# Patient Record
Sex: Male | Born: 1960 | Race: White | Hispanic: No | Marital: Married | Smoking: Never smoker
Health system: Southern US, Community
[De-identification: ages and names within clinical notes are randomized; demographics above are authoritative.]

## PROBLEM LIST (undated history)

## (undated) DIAGNOSIS — E871 Hypo-osmolality and hyponatremia: Secondary | ICD-10-CM

## (undated) DIAGNOSIS — E039 Hypothyroidism, unspecified: Secondary | ICD-10-CM

## (undated) DIAGNOSIS — C801 Malignant (primary) neoplasm, unspecified: Secondary | ICD-10-CM

## (undated) DIAGNOSIS — Z8719 Personal history of other diseases of the digestive system: Secondary | ICD-10-CM

## (undated) DIAGNOSIS — N183 Chronic kidney disease, stage 3 (moderate): Secondary | ICD-10-CM

## (undated) DIAGNOSIS — Z8739 Personal history of other diseases of the musculoskeletal system and connective tissue: Secondary | ICD-10-CM

## (undated) DIAGNOSIS — R253 Fasciculation: Secondary | ICD-10-CM

## (undated) DIAGNOSIS — K219 Gastro-esophageal reflux disease without esophagitis: Secondary | ICD-10-CM

## (undated) DIAGNOSIS — M1990 Unspecified osteoarthritis, unspecified site: Secondary | ICD-10-CM

## (undated) DIAGNOSIS — Z87442 Personal history of urinary calculi: Secondary | ICD-10-CM

## (undated) DIAGNOSIS — R079 Chest pain, unspecified: Secondary | ICD-10-CM

## (undated) DIAGNOSIS — K56609 Unspecified intestinal obstruction, unspecified as to partial versus complete obstruction: Secondary | ICD-10-CM

## (undated) DIAGNOSIS — H9190 Unspecified hearing loss, unspecified ear: Secondary | ICD-10-CM

## (undated) DIAGNOSIS — Z973 Presence of spectacles and contact lenses: Secondary | ICD-10-CM

## (undated) DIAGNOSIS — N401 Enlarged prostate with lower urinary tract symptoms: Secondary | ICD-10-CM

## (undated) DIAGNOSIS — I10 Essential (primary) hypertension: Principal | ICD-10-CM

## (undated) DIAGNOSIS — G4733 Obstructive sleep apnea (adult) (pediatric): Secondary | ICD-10-CM

## (undated) DIAGNOSIS — Z7901 Long term (current) use of anticoagulants: Secondary | ICD-10-CM

## (undated) DIAGNOSIS — M87059 Idiopathic aseptic necrosis of unspecified femur: Secondary | ICD-10-CM

## (undated) DIAGNOSIS — F419 Anxiety disorder, unspecified: Secondary | ICD-10-CM

## (undated) DIAGNOSIS — E782 Mixed hyperlipidemia: Secondary | ICD-10-CM

## (undated) DIAGNOSIS — Z9889 Other specified postprocedural states: Secondary | ICD-10-CM

## (undated) DIAGNOSIS — R7303 Prediabetes: Secondary | ICD-10-CM

## (undated) DIAGNOSIS — K9419 Other complications of enterostomy: Secondary | ICD-10-CM

## (undated) HISTORY — PX: CARDIAC CATHETERIZATION: SHX172

## (undated) HISTORY — DX: Hypothyroidism, unspecified: E03.9

## (undated) HISTORY — PX: TOTAL HIP ARTHROPLASTY: SHX124

## (undated) HISTORY — DX: Hypomagnesemia: E83.42

## (undated) HISTORY — PX: APPENDECTOMY: SHX54

## (undated) HISTORY — DX: Chronic kidney disease, stage 3 (moderate): N18.3

## (undated) HISTORY — PX: CHOLECYSTECTOMY: SHX55

## (undated) HISTORY — DX: Obstructive sleep apnea (adult) (pediatric): G47.33

## (undated) HISTORY — DX: Chest pain, unspecified: R07.9

## (undated) HISTORY — DX: Other specified postprocedural states: Z98.890

## (undated) HISTORY — DX: Prediabetes: R73.03

## (undated) HISTORY — DX: Other complications of enterostomy: K94.19

## (undated) HISTORY — DX: Fasciculation: R25.3

## (undated) HISTORY — DX: Idiopathic aseptic necrosis of unspecified femur: M87.059

## (undated) HISTORY — PX: PROSTATE BIOPSY: SHX241

## (undated) HISTORY — PX: PLACEMENT OF SETON: SHX6029

## (undated) HISTORY — PX: ILEOSTOMY: SHX1783

## (undated) HISTORY — PX: TONSILLECTOMY AND ADENOIDECTOMY: SUR1326

## (undated) HISTORY — DX: Anxiety disorder, unspecified: F41.9

## (undated) HISTORY — DX: Gastro-esophageal reflux disease without esophagitis: K21.9

## (undated) HISTORY — DX: Hypo-osmolality and hyponatremia: E87.1

## (undated) HISTORY — DX: Unspecified intestinal obstruction, unspecified as to partial versus complete obstruction: K56.609

## (undated) HISTORY — DX: Essential (primary) hypertension: I10

## (undated) HISTORY — DX: Mixed hyperlipidemia: E78.2

---

## 1998-12-10 HISTORY — PX: LAPAROSCOPIC APPENDECTOMY: SUR753

## 2004-10-17 ENCOUNTER — Ambulatory Visit: Payer: Self-pay | Admitting: Family Medicine

## 2004-12-10 HISTORY — PX: EXTRACORPOREAL SHOCK WAVE LITHOTRIPSY: SHX1557

## 2004-12-21 ENCOUNTER — Ambulatory Visit: Payer: Self-pay | Admitting: Family Medicine

## 2005-02-21 ENCOUNTER — Ambulatory Visit: Payer: Self-pay | Admitting: Family Medicine

## 2005-11-12 ENCOUNTER — Ambulatory Visit: Payer: Self-pay | Admitting: Family Medicine

## 2006-12-10 HISTORY — PX: CARDIAC CATHETERIZATION: SHX172

## 2006-12-10 HISTORY — PX: CHOLECYSTECTOMY, LAPAROSCOPIC: SHX56

## 2009-12-10 HISTORY — PX: TOTAL HIP ARTHROPLASTY: SHX124

## 2014-12-10 HISTORY — PX: COLONOSCOPY: SHX174

## 2016-01-19 DIAGNOSIS — I129 Hypertensive chronic kidney disease with stage 1 through stage 4 chronic kidney disease, or unspecified chronic kidney disease: Secondary | ICD-10-CM

## 2016-01-19 DIAGNOSIS — I10 Essential (primary) hypertension: Secondary | ICD-10-CM | POA: Insufficient documentation

## 2016-01-19 HISTORY — DX: Essential (primary) hypertension: I10

## 2016-01-19 HISTORY — DX: Hypertensive chronic kidney disease with stage 1 through stage 4 chronic kidney disease, or unspecified chronic kidney disease: I12.9

## 2016-01-30 ENCOUNTER — Ambulatory Visit: Payer: BLUE CROSS/BLUE SHIELD | Admitting: Allergy and Immunology

## 2016-03-11 DIAGNOSIS — R079 Chest pain, unspecified: Secondary | ICD-10-CM | POA: Insufficient documentation

## 2016-03-11 HISTORY — DX: Chest pain, unspecified: R07.9

## 2016-04-10 DIAGNOSIS — K603 Anal fistula, unspecified: Secondary | ICD-10-CM

## 2016-04-10 DIAGNOSIS — Z8719 Personal history of other diseases of the digestive system: Secondary | ICD-10-CM

## 2016-04-10 HISTORY — DX: Anal fistula: K60.3

## 2016-04-10 HISTORY — DX: Anal fistula, unspecified: K60.30

## 2016-04-10 HISTORY — DX: Personal history of other diseases of the digestive system: Z87.19

## 2016-06-26 DIAGNOSIS — F4320 Adjustment disorder, unspecified: Secondary | ICD-10-CM | POA: Insufficient documentation

## 2016-06-26 DIAGNOSIS — K219 Gastro-esophageal reflux disease without esophagitis: Secondary | ICD-10-CM | POA: Insufficient documentation

## 2016-06-26 DIAGNOSIS — E038 Other specified hypothyroidism: Secondary | ICD-10-CM

## 2016-06-26 DIAGNOSIS — H90A22 Sensorineural hearing loss, unilateral, left ear, with restricted hearing on the contralateral side: Secondary | ICD-10-CM

## 2016-06-26 DIAGNOSIS — H9042 Sensorineural hearing loss, unilateral, left ear, with unrestricted hearing on the contralateral side: Secondary | ICD-10-CM | POA: Insufficient documentation

## 2016-06-26 DIAGNOSIS — J309 Allergic rhinitis, unspecified: Secondary | ICD-10-CM

## 2016-06-26 DIAGNOSIS — E782 Mixed hyperlipidemia: Secondary | ICD-10-CM

## 2016-06-26 DIAGNOSIS — M87059 Idiopathic aseptic necrosis of unspecified femur: Secondary | ICD-10-CM

## 2016-06-26 DIAGNOSIS — E039 Hypothyroidism, unspecified: Secondary | ICD-10-CM | POA: Insufficient documentation

## 2016-06-26 DIAGNOSIS — G4733 Obstructive sleep apnea (adult) (pediatric): Secondary | ICD-10-CM

## 2016-06-26 DIAGNOSIS — R253 Fasciculation: Secondary | ICD-10-CM | POA: Insufficient documentation

## 2016-06-26 HISTORY — DX: Idiopathic aseptic necrosis of unspecified femur: M87.059

## 2016-06-26 HISTORY — DX: Adjustment disorder, unspecified: F43.20

## 2016-06-26 HISTORY — DX: Fasciculation: R25.3

## 2016-06-26 HISTORY — DX: Sensorineural hearing loss, unilateral, left ear, with unrestricted hearing on the contralateral side: H90.42

## 2016-06-26 HISTORY — DX: Other specified hypothyroidism: E03.8

## 2016-06-26 HISTORY — DX: Allergic rhinitis, unspecified: J30.9

## 2016-06-26 HISTORY — DX: Mixed hyperlipidemia: E78.2

## 2016-06-26 HISTORY — DX: Sensorineural hearing loss, unilateral, left ear, with restricted hearing on the contralateral side: H90.A22

## 2016-06-26 HISTORY — DX: Gastro-esophageal reflux disease without esophagitis: K21.9

## 2016-06-26 HISTORY — DX: Obstructive sleep apnea (adult) (pediatric): G47.33

## 2016-07-02 DIAGNOSIS — R7303 Prediabetes: Secondary | ICD-10-CM

## 2016-07-02 HISTORY — DX: Prediabetes: R73.03

## 2016-07-06 HISTORY — PX: ANAL FISSURE REPAIR: SHX2312

## 2016-12-10 DIAGNOSIS — F419 Anxiety disorder, unspecified: Secondary | ICD-10-CM

## 2016-12-10 HISTORY — DX: Anxiety disorder, unspecified: F41.9

## 2017-04-19 DIAGNOSIS — N183 Chronic kidney disease, stage 3 unspecified: Secondary | ICD-10-CM | POA: Insufficient documentation

## 2017-04-19 HISTORY — DX: Chronic kidney disease, stage 3 unspecified: N18.30

## 2017-06-07 HISTORY — PX: LAPAROSCOPIC LOOP COLOSTOMY: SHX6816

## 2017-06-24 DIAGNOSIS — K56609 Unspecified intestinal obstruction, unspecified as to partial versus complete obstruction: Secondary | ICD-10-CM

## 2017-06-24 HISTORY — DX: Unspecified intestinal obstruction, unspecified as to partial versus complete obstruction: K56.609

## 2017-06-26 DIAGNOSIS — E871 Hypo-osmolality and hyponatremia: Secondary | ICD-10-CM | POA: Insufficient documentation

## 2017-06-26 HISTORY — DX: Hypo-osmolality and hyponatremia: E87.1

## 2017-06-28 HISTORY — PX: EXPLORATORY LAPAROTOMY W/ BOWEL RESECTION: SHX1544

## 2017-07-03 DIAGNOSIS — Z9889 Other specified postprocedural states: Secondary | ICD-10-CM

## 2017-07-03 HISTORY — DX: Other specified postprocedural states: Z98.890

## 2017-07-10 DIAGNOSIS — N289 Disorder of kidney and ureter, unspecified: Secondary | ICD-10-CM | POA: Diagnosis not present

## 2017-07-10 DIAGNOSIS — E860 Dehydration: Secondary | ICD-10-CM | POA: Diagnosis not present

## 2017-07-10 DIAGNOSIS — E871 Hypo-osmolality and hyponatremia: Secondary | ICD-10-CM | POA: Diagnosis not present

## 2017-07-10 DIAGNOSIS — E872 Acidosis: Secondary | ICD-10-CM | POA: Diagnosis not present

## 2017-07-10 DIAGNOSIS — I10 Essential (primary) hypertension: Secondary | ICD-10-CM

## 2017-07-25 DIAGNOSIS — F419 Anxiety disorder, unspecified: Secondary | ICD-10-CM | POA: Insufficient documentation

## 2017-07-25 HISTORY — DX: Anxiety disorder, unspecified: F41.9

## 2017-08-04 HISTORY — DX: Hypomagnesemia: E83.42

## 2017-11-21 DIAGNOSIS — K9419 Other complications of enterostomy: Secondary | ICD-10-CM | POA: Insufficient documentation

## 2017-11-21 HISTORY — DX: Other complications of enterostomy: K94.19

## 2018-03-07 HISTORY — PX: RECTAL EXAM UNDER ANESTHESIA: SHX6399

## 2018-06-02 HISTORY — PX: COLOSTOMY TAKEDOWN: SHX5783

## 2018-09-24 NOTE — Progress Notes (Signed)
Cardiology Office Note:    Date:  09/25/2018   ID:  Lee Stevens, DOB [birth year], MRN [id number]  PCP:  Olive Bass, MD  Cardiologist:  Norman Herrlich, MD   Referring MD: No ref. provider found  ASSESSMENT:    1. Benign hypertension   2. History of closure of ileostomy   3. Chronic kidney disease, stage 3 (HCC)   4. Atypical angina (HCC)    PLAN:    In order of problems listed above:  1. Stable blood pressure target continue current treatment 2. Noted on chart review complicated from a cardiovascular perspective 3. Stable he can tolerate the dye load for CT 4. Known mild coronary atherosclerosis having atypical angina cardiac CTA ordered 5. Dyslipidemia stable continue his high intensity statin to return to  Next appointment 6 weeks   Medication Adjustments/Labs and Tests Ordered: Current medicines are reviewed at length with the patient today.  Concerns regarding medicines are outlined above.  Orders Placed This Encounter  Procedures  . CT CORONARY MORPH W/CTA COR W/SCORE W/CA W/CM &/OR WO/CM  . CT CORONARY FRACTIONAL FLOW RESERVE DATA PREP  . CT CORONARY FRACTIONAL FLOW RESERVE FLUID ANALYSIS  . EKG 12-Lead   No orders of the defined types were placed in this encounter.    Chief Complaint  Patient presents with  . Hypertension  . Hyperlipidemia  . Chest Pain    History of Present Illness:    Lee Stevens is a 57 y.o. male with luminal irregularity on left heart cath in 2008 hypertension, CKD,hyperlipidemia and prediabeteswho is being seen today for the evaluation of chest pain at the patients request. He was last seen by me 03/14/17  Following reversal of his ileostomy he had resolution chest pain and it has recurred.  At times is typical exertional angina relieved with rest but usually it is a diffuse aching throughout the right and left chest lasts for hours at a time and in fact is lasting for days.  Is not pleuritic no nausea or vomiting no diaphoresis  no radiation to the back or neck.  He has known mild coronary atherosclerosis had a normal stress echo in 2017 for further evaluation cardiac CTA.  He also has a feeling of forceful heartbeat but not irregular or fast he prefers to purchase an over-the-counter iPhone adapter to record his heart rhythm.  He will follow-up with me in the office in 6 months his hypertension stable on target dyslipidemia stable and continue his current medications. Past Medical History:  Diagnosis Date  . Anxiety 07/25/2017   2018  . 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 Benign hypertension 01/19/2016  . Chest pain in adult 03/11/2016   Seen at [facility] ED 12/08/15 by DR Earl Gala, EKG CXR and Troponin normal, elevated CRP and treated as pericarditis    Stress echo negative for ischemia 2013 at [address]  . Chronic kidney disease, stage 3 (HCC) 04/19/2017  . GERD (gastroesophageal reflux disease) 06/26/2016  . History of closure of ileostomy 07/03/2017  . Hyperlipidemia, mixed 06/26/2016  . Hypomagnesemia 08/04/2017   2018: 1.7  . Hyponatremia 06/26/2017  . 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 Obstructive sleep apnea 06/26/2016  . Prediabetes 07/02/2016   2018: 121/5.6  . SBO (small bowel obstruction) (HCC) 06/24/2017  . Subclinical hypothyroidism 06/26/2016    Past Surgical History:  Procedure Laterality Date  . APPENDECTOMY    . CARDIAC CATHETERIZATION    .  CHOLECYSTECTOMY    . ILEOSTOMY    . TONSILLECTOMY AND ADENOIDECTOMY    . TOTAL HIP ARTHROPLASTY      Current Medications: Current Meds  Medication Sig  . olmesartan (BENICAR) 20 MG tablet TAKE 1 TABLET EACH AM FOR HIGH BLOOD PRESSURE  . omeprazole (PRILOSEC) 40 MG capsule TAKE 1 CAPSULE DAILY FOR REFLUX  . rosuvastatin (CRESTOR) 20 MG tablet Take 20 mg by mouth daily.     Allergies:   Cephalexin   Social History   Socioeconomic History  . Marital status: Married    Spouse name: Not on file  .  Number of children: Not on file  . Years of education: Not on file  . Highest education level: Not on file  Occupational History  . Not on file  Social Needs  . Financial resource strain: Not on file  . Food insecurity:    Worry: Not on file    Inability: Not on file  . Transportation needs:    Medical: Not on file    Non-medical: Not on file  Tobacco Use  . Smoking status: Never Smoker  . Smokeless tobacco: Former Neurosurgeon    Types: Chew  Substance and Sexual Activity  . Alcohol use: Not Currently  . Drug use: Never  . Sexual activity: Not on file  Lifestyle  . Physical activity:    Days per week: Not on file    Minutes per session: Not on file  . Stress: Not on file  Relationships  . Social connections:    Talks on phone: Not on file    Gets together: Not on file    Attends religious service: Not on file    Active member of club or organization: Not on file    Attends meetings of clubs or organizations: Not on file    Relationship status: Not on file  Other Topics Concern  . Not on file  Social History Narrative  . Not on file     Family History: The patient's family history includes Heart attack in his father; Hyperlipidemia in his father; Hypertension in his mother.  ROS:   ROS Please see the history of present illness.     All other systems reviewed and are negative.  EKGs/Labs/Other Studies Reviewed:    The following studies were reviewed today:   EKG:  EKG  SRTH and normal ordered today.    Recent Labs: Cr 1,55 GFR 50 cc/min K 4.5 normal AST ALT No results found for requested labs within last 8760 hours.  Recent Lipid Panel   Chol 120 LDL 68 HDL 36     04/14/18 No results found for: CHOL, TRIG, HDL, CHOLHDL, VLDL, LDLCALC, LDLDIRECT  Physical Exam:    VS:  BP 120/84 (BP Location: Right Arm, Patient Position: Sitting, Cuff Size: Large)   Pulse 67   Ht 6\' 3"  (1.905 m)   Wt 222 lb 12.8 oz (101.1 kg)   SpO2 97%   BMI 27.85 kg/m     Wt Readings from  Last 3 Encounters:  09/25/18 222 lb 12.8 oz (101.1 kg)     GEN:  Well nourished, well developed in no acute distress HEENT: Normal NECK: No JVD; No carotid bruits LYMPHATICS: No lymphadenopathy CARDIAC: RRR, no murmurs, rubs, gallops RESPIRATORY:  Clear to auscultation without rales, wheezing or rhonchi  ABDOMEN: Soft, non-tender, non-distended MUSCULOSKELETAL:  No edema; No deformity  SKIN: Warm and dry NEUROLOGIC:  Alert and oriented x 3 PSYCHIATRIC:  Normal affect  Signed, Norman Herrlich, MD  09/25/2018 8:46 AM     [facility]

## 2018-09-25 ENCOUNTER — Encounter: Payer: Self-pay | Admitting: *Deleted

## 2018-09-25 ENCOUNTER — Ambulatory Visit (INDEPENDENT_AMBULATORY_CARE_PROVIDER_SITE_OTHER): Payer: BLUE CROSS/BLUE SHIELD | Admitting: Cardiology

## 2018-09-25 VITALS — BP 120/84 | HR 67 | Ht 75.0 in | Wt 222.8 lb

## 2018-09-25 DIAGNOSIS — I10 Essential (primary) hypertension: Secondary | ICD-10-CM

## 2018-09-25 DIAGNOSIS — I208 Other forms of angina pectoris: Secondary | ICD-10-CM

## 2018-09-25 DIAGNOSIS — N183 Chronic kidney disease, stage 3 unspecified: Secondary | ICD-10-CM

## 2018-09-25 DIAGNOSIS — Z9889 Other specified postprocedural states: Secondary | ICD-10-CM | POA: Diagnosis not present

## 2018-09-25 DIAGNOSIS — R0789 Other chest pain: Secondary | ICD-10-CM

## 2018-09-25 DIAGNOSIS — I2089 Other forms of angina pectoris: Secondary | ICD-10-CM

## 2018-09-25 HISTORY — DX: Other chest pain: R07.89

## 2018-09-25 HISTORY — DX: Other forms of angina pectoris: I20.89

## 2018-09-25 MED ORDER — METOPROLOL TARTRATE 50 MG PO TABS: 50.0000 mg | ORAL_TABLET | Freq: Once | ORAL | 0 refills | Status: DC

## 2018-09-25 NOTE — Patient Instructions (Addendum)
Medication Instructions:  Your physician recommends that you continue on your current medications as directed. Please refer to the Current Medication list given to you today.  If you need a refill on your cardiac medications before your next appointment, please call your pharmacy.   Lab work: You will have BMP 1 week prior to Cardiac CTA   If you have labs (blood work) drawn today and your tests are completely normal, you will receive your results only by: Marland Kitchen MyChart Message (if you have MyChart) OR . A paper copy in the mail If you have any lab test that is abnormal or we need to change your treatment, we will call you to review the results.  Testing/Procedures: You had an EKG today.  Your physician has requested that you have cardiac CT. Cardiac computed tomography (CT) is a painless test that uses an x-ray machine to take clear, detailed pictures of your heart. For further information please visit [email or web address] Please follow instruction sheet as given.   Please arrive at the [facility] main entrance of [facility] at xx:xx AM (30-45 minutes prior to test start time)  [facility] [address] [place], [place] [address] [phone]  Proceed to the [facility] Radiology Department (First Floor).  Please follow these instructions carefully (unless otherwise directed):  Hold all erectile dysfunction medications at least 48 hours prior to test.  On the Night Before the Test: . Be sure to Drink plenty of water. . Do not consume any caffeinated/decaffeinated beverages or chocolate 12 hours prior to your test. . Do not take any antihistamines 12 hours prior to your test.  On the Day of the Test: . Drink plenty of water. Do not drink any water within one hour of the test. . Do not eat any food 4 hours prior to the test. . You may take your regular medications prior to the test.  . Take metoprolol (Lopressor) two hours prior to test.    *For  Clinical Staff only. Please instruct patient the following:*        -Drink plenty of water       -Take metoprolol (Lopressor) 2 hours prior to test (if applicable).                  -If HR is less than 55 BPM- No Beta Blocker                -IF HR is greater than 55 BPM and patient is less than or equal to 45 yrs old Lopressor 100mg  x1.                -If HR is greater than 55 BPM and patient is greater than 83 yrs old Lopressor 50 mg x1.     Do not give Lopressor to patients with an allergy to lopressor or anyone with asthma or active COPD symptoms (currently taking steroids).       After the Test: . Drink plenty of water. . After receiving IV contrast, you may experience a mild flushed feeling. This is normal. . On occasion, you may experience a mild rash up to 24 hours after the test. This is not dangerous. If this occurs, you can take Benadryl 25 mg and increase your fluid intake. . If you experience trouble breathing, this can be serious. If it is severe call 911 IMMEDIATELY. If it is mild, please call our office.    Follow-Up: At [facility], you and your health needs  are our priority.  As part of our continuing mission to provide you with exceptional heart care, we have created designated Provider Care Teams.  These Care Teams include your primary Cardiologist (physician) and Advanced Practice Providers (APPs -  Physician Assistants and Nurse Practitioners) who all work together to provide you with the care you need, when you need it. . You will need a follow up appointment in 6 weeks.    Any Other Special Instructions Will Be Listed Below (If Applicable).          Introducing KardiaBand, the new wearable EKG by [employer]. Venetia Maxon replaces your original Apple Watch band. The first of its kind, FDA-cleared KardiaBand provides accurate and instant analysis for detecting atrial fibrillation (AF) and normal sinus rhythm in an EKG. Simply place your thumb on the integrated  KardiaBand sensor to take a medical-grade EKG in just 30 seconds. Results appear instantly on your Apple Watch. Venetia Maxon is available today for just $199. KardiaBand features are designed exclusively for use with [employer] - $99 year. [employer] for [employer] includes [employer]'s revolutionary SmartRhythm monitoring feature. SmartRhythm monitoring uses an intelligent neural network that runs directly on the [employer], constantly acquiring data from the watch's heart rate sensor and its accelerometer. SmartRhythm compares your heart rate to what it expects from your minute-by-minute level of activity. When the network sees a pattern of heart rate and activity that it does not expect, it notifies you to take an EKG. With [employer], peace of mind is just an EKG away.     Cardiac CT Angiogram A cardiac CT angiogram is a procedure to look at the heart and the area around the heart. It may be done to help find the cause of chest pains or other symptoms of heart disease. During this procedure, a large X-ray machine, called a CT scanner, takes detailed pictures of the heart and the surrounding area after a dye (contrast material) has been injected into blood vessels in the area. The procedure is also sometimes called a coronary CT angiogram, coronary artery scanning, or CTA. A cardiac CT angiogram allows the health care provider to see how well blood is flowing to and from the heart. The health care provider will be able to see if there are any problems, such as:  Blockage or narrowing of the coronary arteries in the heart.  Fluid around the heart.  Signs of weakness or disease in the muscles, valves, and tissues of the heart.  Tell a health care provider about:  Any allergies you have. This is especially important if you have had a previous allergic reaction to contrast dye.  All medicines you are taking, including vitamins, herbs, eye drops, creams, and over-the-counter  medicines.  Any blood disorders you have.  Any surgeries you have had.  Any medical conditions you have.  Whether you are pregnant or may be pregnant.  Any anxiety disorders, chronic pain, or other conditions you have that may increase your stress or prevent you from lying still. What are the risks? Generally, this is a safe procedure. However, problems may occur, including:  Bleeding.  Infection.  Allergic reactions to medicines or dyes.  Damage to other structures or organs.  Kidney damage from the dye or contrast that is used.  Increased risk of cancer from radiation exposure. This risk is low. Talk with your health care provider about: ? The risks and benefits of testing. ? How you can receive the lowest dose of radiation.  What happens before the procedure?  Wear comfortable clothing and remove any jewelry, glasses, dentures, and hearing aids.  Follow instructions from your health care provider about eating and drinking. This may include: ? For 12 hours before the test - avoid caffeine. This includes tea, coffee, soda, energy drinks, and diet pills. Drink plenty of water or other fluids that do not have caffeine in them. Being well-hydrated can prevent complications. ? For 4-6 hours before the test - stop eating and drinking. The contrast dye can cause nausea, but this is less likely if your stomach is empty.  Ask your health care provider about changing or stopping your regular medicines. This is especially important if you are taking diabetes medicines, blood thinners, or medicines to treat erectile dysfunction. What happens during the procedure?  Hair on your chest may need to be removed so that small sticky patches called electrodes can be placed on your chest. These will transmit information that helps to monitor your heart during the test.  An IV tube will be inserted into one of your veins.  You might be given a medicine to control your heart rate during the  test. This will help to ensure that good images are obtained.  You will be asked to lie on an exam table. This table will slide in and out of the CT machine during the procedure.  Contrast dye will be injected into the IV tube. You might feel warm, or you may get a metallic taste in your mouth.  You will be given a medicine (nitroglycerin) to relax (dilate) the arteries in your heart.  The table that you are lying on will move into the CT machine tunnel for the scan.  The person running the machine will give you instructions while the scans are being done. You may be asked to: ? Keep your arms above your head. ? Hold your breath. ? Stay very still, even if the table is moving.  When the scanning is complete, you will be moved out of the machine.  The IV tube will be removed. The procedure may vary among health care providers and hospitals. What happens after the procedure?  You might feel warm, or you may get a metallic taste in your mouth from the contrast dye.  You may have a headache from the nitroglycerin.  After the procedure, drink water or other fluids to wash (flush) the contrast material out of your body.  Contact a health care provider if you have any symptoms of allergy to the contrast. These symptoms include: ? Shortness of breath. ? Rash or hives. ? A racing heartbeat.  Most people can return to their normal activities right after the procedure. Ask your health care provider what activities are safe for you.  It is up to you to get the results of your procedure. Ask your health care provider, or the department that is doing the procedure, when your results will be ready. Summary  A cardiac CT angiogram is a procedure to look at the heart and the area around the heart. It may be done to help find the cause of chest pains or other symptoms of heart disease.  During this procedure, a large X-ray machine, called a CT scanner, takes detailed pictures of the heart and  the surrounding area after a dye (contrast material) has been injected into blood vessels in the area.  Ask your health care provider about changing or stopping your regular medicines before the procedure. This is especially important  if you are taking diabetes medicines, blood thinners, or medicines to treat erectile dysfunction.  After the procedure, drink water or other fluids to wash (flush) the contrast material out of your body. This information is not intended to replace advice given to you by your health care provider. Make sure you discuss any questions you have with your health care provider. Document Released: 11/08/2008 Document Revised: 10/15/2016 Document Reviewed: 10/15/2016 Elsevier Interactive Patient Education  2017 [employer].  .

## 2018-10-16 ENCOUNTER — Telehealth: Payer: Self-pay | Admitting: Cardiology

## 2018-10-16 NOTE — Telephone Encounter (Signed)
Patient called stating his CT still has not been scheduled.

## 2018-10-17 NOTE — Telephone Encounter (Signed)
Left message for patient on cell phone per DPR advising patient that we are waiting for insurance approval before cardiac CTA can be scheduled. Reminded him that unfortunately this is a bit of a process and the scheduler will call him when approval is received. Advised patient to contact our office with further questions or concerns.

## 2018-10-29 ENCOUNTER — Telehealth: Payer: Self-pay | Admitting: Cardiology

## 2018-10-29 NOTE — Telephone Encounter (Signed)
Patient advised that it can take up to 4 - 6 weeks for the insurance to approve cardiac CTA. Once his insurance has approved he will be scheduled.  Patient verbalized understanding.

## 2018-10-30 ENCOUNTER — Telehealth: Payer: Self-pay

## 2018-10-30 NOTE — Telephone Encounter (Signed)
Left message advising patient that he will need to have BMP prior to cardiac CTA on 11-14-18. Advised he should report to office for lab work on 11-14-18.

## 2018-10-31 NOTE — Telephone Encounter (Signed)
Patient called to inform us that he will be out of town 11/07/2018-11/13/18 so he wanted to know if he could have his lab work done before he leaves on Wednesday, 11/05/18. Advised patient that this is acceptable. No further questions.

## 2018-11-06 LAB — BASIC METABOLIC PANEL
BUN / CREAT RATIO: 10 (ref 9–20)
BUN: 16 mg/dL (ref 6–24)
CO2: 24 mmol/L (ref 20–29)
CREATININE: 1.61 mg/dL — AB (ref 0.76–1.27)
Calcium: 10.1 mg/dL (ref 8.7–10.2)
Chloride: 99 mmol/L (ref 96–106)
GFR calc Af Amer: 55 mL/min/{1.73_m2} — ABNORMAL LOW (ref >59)
GFR, EST NON AFRICAN AMERICAN: 47 mL/min/{1.73_m2} — AB (ref >59)
Glucose: 95 mg/dL (ref 65–99)
Potassium: 4.6 mmol/L (ref 3.5–5.2)
Sodium: 139 mmol/L (ref 134–144)

## 2018-11-10 ENCOUNTER — Telehealth: Payer: Self-pay | Admitting: *Deleted

## 2018-11-10 ENCOUNTER — Other Ambulatory Visit: Payer: Self-pay | Admitting: *Deleted

## 2018-11-10 MED ORDER — SODIUM CHLORIDE 0.9 % IV SOLN: 450.0000 mL | Freq: Once | INTRAVENOUS | 0 refills | Status: AC

## 2018-11-10 NOTE — Telephone Encounter (Signed)
-----   Message from Baldo DaubBrian J Munley, MD sent at 11/06/2018  7:48 AM EST ----- Can we IVF load prior to cardiac CTA?

## 2018-11-10 NOTE — Telephone Encounter (Signed)
Left message to return call to discuss lab results

## 2018-11-11 MED ORDER — METOPROLOL TARTRATE 50 MG PO TABS: 50.0000 mg | ORAL_TABLET | Freq: Once | ORAL | 0 refills | Status: DC

## 2018-11-11 NOTE — Telephone Encounter (Signed)
Patient informed of lab results and advised that he will need to arrive early at the radiology department of [facility] on Friday, 11/14/18 for pre-procedure IV fluids due to kidney function. Patient verbalized understanding to be there at 2:00 pm. Reviewed cardiac CTA instructions with patient and answered further questions. New prescription for one time dose of metoprolol tartrate sent to [facility] in [place] as [facility] did not have medication available. No further questions.

## 2018-11-11 NOTE — Addendum Note (Signed)
Addended by: Crist FatLOCKHART, Metta Koranda P on: 11/11/2018 10:15 AM   Modules accepted: Orders

## 2018-11-14 ENCOUNTER — Ambulatory Visit (HOSPITAL_COMMUNITY)
Admission: RE | Admit: 2018-11-14 | Discharge: 2018-11-14 | Disposition: A | Discharge status: Home / Self Care | Payer: BLUE CROSS/BLUE SHIELD | Source: Ambulatory Visit | Attending: Cardiology | Admitting: Cardiology

## 2018-11-14 DIAGNOSIS — I208 Other forms of angina pectoris: Secondary | ICD-10-CM

## 2018-11-14 DIAGNOSIS — Z006 Encounter for examination for normal comparison and control in clinical research program: Secondary | ICD-10-CM

## 2018-11-14 MED ORDER — SODIUM CHLORIDE 0.9 % IV SOLN
Freq: Once | INTRAVENOUS | Status: AC
  Administered 2018-11-14: 15:00:00 via INTRAVENOUS

## 2018-11-14 MED ORDER — NITROGLYCERIN 0.4 MG SL SUBL
SUBLINGUAL_TABLET | SUBLINGUAL | Status: AC
  Filled 2018-11-14: qty 2

## 2018-11-14 MED ORDER — IOPAMIDOL (ISOVUE-370) INJECTION 76%
100.0000 mL | Freq: Once | INTRAVENOUS | Status: AC | PRN
  Administered 2018-11-14: 100 mL via INTRAVENOUS

## 2018-11-14 NOTE — Research (Signed)
Lee Stevens met inclusion and exclusion criteria.  The informed consent form, study requirements and expectations were reviewed with the subject and questions and concerns were addressed prior to the signing of the consent form.  The subject verbalized understanding of the trial requirements.  The subject agreed to participate in the CADFEM trial and signed the informed consent.  The informed consent was obtained prior to performance of any protocol-specific procedures for the subject.  A copy of the signed informed consent was given to the subject and a copy was placed in the subject's medical record.   [name]. [employer]

## 2018-11-26 DIAGNOSIS — H9313 Tinnitus, bilateral: Secondary | ICD-10-CM

## 2018-11-26 DIAGNOSIS — H6981 Other specified disorders of Eustachian tube, right ear: Secondary | ICD-10-CM | POA: Insufficient documentation

## 2018-11-26 DIAGNOSIS — H6991 Unspecified Eustachian tube disorder, right ear: Secondary | ICD-10-CM

## 2018-11-26 HISTORY — DX: Tinnitus, bilateral: H93.13

## 2018-11-26 HISTORY — DX: Unspecified eustachian tube disorder, right ear: H69.91

## 2018-12-01 DIAGNOSIS — I2510 Atherosclerotic heart disease of native coronary artery without angina pectoris: Secondary | ICD-10-CM | POA: Insufficient documentation

## 2018-12-01 HISTORY — DX: Atherosclerotic heart disease of native coronary artery without angina pectoris: I25.10

## 2018-12-01 NOTE — Progress Notes (Signed)
Cardiology Office Note:    Date:  12/04/2018   ID:  Lee IbaDanny R Axley, DOB [birth year], MRN [id number]  PCP:  Olive Bassough, Robert L, MD  Cardiologist:  Norman HerrlichBrian Delcie Ruppert, MD    Referring MD: Olive Bassough, Robert L, MD    ASSESSMENT:    1. CAD in native artery   2. Hypertensive kidney disease with stage 2 chronic kidney disease   3. Hyperlipidemia, mixed   4. PFO (patent foramen ovale)    PLAN:    In order of problems listed above:  1. Stable, his cardiac CTA shows moderate diffuse non-flow-limiting CAD.  He is on appropriate medical therapy of aspirin antihypertensive and high intensity statin.  He has vague symptoms were rarely he gets chest tightness he had [place] and was associated with a sensation of apprehension as he describes a near out of body experience and he has a dread of a heart problem while he is traveling which he does extensively with work.  He has a prescription for nitroglycerin I told him to take as needed for chest tightness and then put him on a low-dose of a beta-blocker to try to attenuate these episodes.  Were both comfortable he does not need coronary revascularization.  I did encourage him to continue his good diabetic control A1c 5.5 lipid lowering with an LDL with a recent increase in his high intensity statin and to find a fish oil product high in EPA as unfortunately he will not qualify for prescription vascepa.  I also challenged him to achieve 150-minute minutes of activity exercise per week.  I will plan to see him back in the office in 1 year. 2. Well-controlled systolic at target continue ARB with his diabetes and CKD 3. Continue his high intensity statin dose increased and if able to find a high EPA content fish oil which seems to be synergistic with statins 4. PFO noted on CT scan instructed not to scuba.  At this time no indication to think of closure and will continue low-dose aspirin. 5. CTA showed pulmonary nodule however  follow-up CT performed without contrast 6 months   Next appointment: 1 year   Medication Adjustments/Labs and Tests Ordered: Current medicines are reviewed at length with the patient today.  Concerns regarding medicines are outlined above.  No orders of the defined types were placed in this encounter.  No orders of the defined types were placed in this encounter.   Chief Complaint  Patient presents with  . Follow-up    CT Scan    History of Present Illness:    Lee Stevens is a 57 y.o. male with a hx of coronary atherosclerosis with luminal irregularity on left heart cath in 2008 hypertension, CKD,hyperlipidemia and prediabetes last seen by me 09/25/2018 for chest pain.  Cardiac CTA showed moderate CAD with stenosis in the mid right coronary artery proximal PDA proximal posterior lateral branch proximal and mid LAD ramus and subsequent fractional flow reserve did not show flow-limiting stenosis.  He also was noted to have patent foramen ovale.  Most severe stenosis was in the range of 50 to 69% most recent lipid profile 04/14/2018 at a cholesterol 120 LDL 68 HDL 36 he has CKD stage II with a GFR 50 cc creatinine 1.55 Compliance with diet, lifestyle and medications: Yes  He is reassured by the findings of his cardiac CTA.  [place] [facility] and vague chest tightness and then symptoms of  anxiety which tends to happen when he travels.  To intensify his antianginal treatment and start a low-dose beta-blocker he will use nitroglycerin if needed and I will plan to see back in the office in 1 year.  He also has a patent foramen ovale and I told him he cannot scuba.  I had an opportunity to review the CTA with him took the report highlighted the findings protein explanation and gave him a copy for his daughter who is a Engineer, civil (consulting)nurse. Past Medical History:  Diagnosis Date  . Anxiety 07/25/2017   2018  . 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 Benign fasciculation-cramp  syndrome 06/26/2016  . Benign hypertension 01/19/2016  . Chest pain in adult 03/11/2016   [facility] by DR Earl Galasborne, EKG CXR and Troponin normal, elevated CRP and treated as pericarditis    Stress echo negative for ischemia 2013 at 13 [address]  . Chronic kidney disease, stage 3 (HCC) 04/19/2017  . GERD (gastroesophageal reflux disease) 06/26/2016  . History of closure of ileostomy 07/03/2017  . Hyperlipidemia, mixed 06/26/2016  . Hypomagnesemia 08/04/2017   2018: 1.7  . Hyponatremia 06/26/2017  . 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 Obstructive sleep apnea 06/26/2016  . Prediabetes 07/02/2016   2018: 121/5.6  . SBO (small bowel obstruction) (HCC) 06/24/2017  . Subclinical hypothyroidism 06/26/2016    Past Surgical History:  Procedure Laterality Date  . APPENDECTOMY    . CARDIAC CATHETERIZATION    . CHOLECYSTECTOMY    . ILEOSTOMY    . TONSILLECTOMY AND ADENOIDECTOMY    . TOTAL HIP ARTHROPLASTY      Current Medications: Current Meds  Medication Sig  . aspirin EC 81 MG tablet Take 81 mg by mouth daily.  . Multiple Vitamin (MULTIVITAMIN) tablet Take 1 tablet by mouth daily.  Marland Kitchen. olmesartan (BENICAR) 20 MG tablet TAKE 1 TABLET EACH AM FOR HIGH BLOOD PRESSURE  . omeprazole (PRILOSEC) 40 MG capsule TAKE 1 CAPSULE DAILY FOR REFLUX  . rosuvastatin (CRESTOR) 40 MG tablet Take 40 mg by mouth daily.      Allergies:   Cephalexin   Social History   Socioeconomic History  . Marital status: Married    Spouse name: Not on file  . Number of children: Not on file  . Years of education: Not on file  . Highest education level: Not on file  Occupational History  . Not on file  Social Needs  . Financial resource strain: Not on file  . Food insecurity:    Worry: Not on file    Inability: Not on file  . Transportation needs:    Medical: Not on file    Non-medical: Not on file  Tobacco Use  . Smoking status: Never Smoker  . Smokeless tobacco: Former NeurosurgeonUser    Types: Chew  Substance and  Sexual Activity  . Alcohol use: Not Currently  . Drug use: Never  . Sexual activity: Not on file  Lifestyle  . Physical activity:    Days per week: Not on file    Minutes per session: Not on file  . Stress: Not on file  Relationships  . Social connections:    Talks on phone: Not on file    Gets together: Not on file    Attends religious service: Not on file    Active member of club or organization: Not on file    Attends meetings of clubs or organizations: Not on file    Relationship status: Not  on file  Other Topics Concern  . Not on file  Social History Narrative  . Not on file     Family History: The patient's family history includes Heart attack in his father; Hyperlipidemia in his father; Hypertension in his mother. ROS:   Please see the history of present illness.    All other systems reviewed and are negative.  EKGs/Labs/Other Studies Reviewed:    The following studies were reviewed today  Recent Labs: 11/05/2018: BUN 16; Creatinine, Ser 1.61; Potassium 4.6; Sodium 139  Recent Lipid Panel No results found for: CHOL, TRIG, HDL, CHOLHDL, VLDL, LDLCALC, LDLDIRECT  Physical Exam:    VS:  BP 110/68   Pulse 70   Ht 6\' 3"  (1.905 m)   Wt 223 lb 1.9 oz (101.2 kg)   SpO2 97%   BMI 27.89 kg/m     Wt Readings from Last 3 Encounters:  12/04/18 223 lb 1.9 oz (101.2 kg)  09/25/18 222 lb 12.8 oz (101.1 kg)     GEN:  Well nourished, well developed in no acute distress HEENT: Normal NECK: No JVD; No carotid bruits LYMPHATICS: No lymphadenopathy CARDIAC: RRR, no murmurs, rubs, gallops RESPIRATORY:  Clear to auscultation without rales, wheezing or rhonchi  ABDOMEN: Soft, non-tender, non-distended MUSCULOSKELETAL:  No edema; No deformity  SKIN: Warm and dry NEUROLOGIC:  Alert and oriented x 3 PSYCHIATRIC:  Normal affect    Signed, Norman Herrlich, MD  12/04/2018 9:34 AM    [facility]

## 2018-12-04 ENCOUNTER — Other Ambulatory Visit: Payer: Self-pay

## 2018-12-04 ENCOUNTER — Ambulatory Visit (INDEPENDENT_AMBULATORY_CARE_PROVIDER_SITE_OTHER): Payer: BLUE CROSS/BLUE SHIELD | Admitting: Cardiology

## 2018-12-04 ENCOUNTER — Encounter: Payer: Self-pay | Admitting: Cardiology

## 2018-12-04 VITALS — BP 110/68 | HR 70 | Ht 75.0 in | Wt 223.1 lb

## 2018-12-04 DIAGNOSIS — N182 Chronic kidney disease, stage 2 (mild): Secondary | ICD-10-CM

## 2018-12-04 DIAGNOSIS — I129 Hypertensive chronic kidney disease with stage 1 through stage 4 chronic kidney disease, or unspecified chronic kidney disease: Secondary | ICD-10-CM

## 2018-12-04 DIAGNOSIS — I2510 Atherosclerotic heart disease of native coronary artery without angina pectoris: Secondary | ICD-10-CM

## 2018-12-04 DIAGNOSIS — Q211 Atrial septal defect: Secondary | ICD-10-CM | POA: Diagnosis not present

## 2018-12-04 DIAGNOSIS — E782 Mixed hyperlipidemia: Secondary | ICD-10-CM | POA: Diagnosis not present

## 2018-12-04 DIAGNOSIS — Q2112 Patent foramen ovale: Secondary | ICD-10-CM

## 2018-12-04 DIAGNOSIS — R918 Other nonspecific abnormal finding of lung field: Secondary | ICD-10-CM

## 2018-12-04 MED ORDER — NITROGLYCERIN 0.4 MG SL SUBL: 0.4000 mg | SUBLINGUAL_TABLET | SUBLINGUAL | 11 refills | Status: DC | PRN

## 2018-12-04 MED ORDER — FISH OIL 1200 MG PO CAPS: ORAL_CAPSULE | ORAL | Status: DC

## 2018-12-04 MED ORDER — METOPROLOL SUCCINATE ER 25 MG PO TB24: 25.0000 mg | ORAL_TABLET | Freq: Every day | ORAL | 3 refills | Status: DC

## 2018-12-04 MED ORDER — METOPROLOL SUCCINATE ER 25 MG PO TB24: 25.0000 mg | ORAL_TABLET | Freq: Every day | ORAL | 1 refills | Status: DC

## 2018-12-04 NOTE — Patient Instructions (Addendum)
Medication Instructions:  Your physician has recommended you make the following change in your medication:   START fish oil (high in EPA): Take 2-4 grams daily  START nitroglycerin as needed for chest pain: When having chest pain, stop what you are doing and sit down. Take 1 nitro, wait 5 minutes. Still having chest pain, take 1 nitro, wait 5 minutes. Still having chest pain, take 1 nitro, dial 911. Total of 3 nitro in 15 minutes. START metoprolol succinate (toprol XL) 25 mg: Take 1 tablet daily  If you need a refill on your cardiac medications before your next appointment, please call your pharmacy.   Lab work: None  If you have labs (blood work) drawn today and your tests are completely normal, you will receive your results only by: Marland Kitchen MyChart Message (if you have MyChart) OR . A paper copy in the mail If you have any lab test that is abnormal or we need to change your treatment, we will call you to review the results.  Testing/Procedures: None  Follow-Up: At [facility], you and your health needs are our priority.  As part of our continuing mission to provide you with exceptional heart care, we have created designated Provider Care Teams.  These Care Teams include your primary Cardiologist (physician) and Advanced Practice Providers (APPs -  Physician Assistants and Nurse Practitioners) who all work together to provide you with the care you need, when you need it. You will need a follow up appointment in 1 years.  Please call our office 2 months in advance to schedule this appointment.    **Please exercise 150 minutes daily!    Nitroglycerin sublingual tablets What is this medicine? NITROGLYCERIN (nye troe GLI ser in) is a type of vasodilator. It relaxes blood vessels, increasing the blood and oxygen supply to your heart. This medicine is used to relieve chest pain caused by angina. It is also used to prevent chest pain before activities like climbing stairs, going outdoors in cold  weather, or sexual activity. This medicine may be used for other purposes; ask your health care provider or pharmacist if you have questions. COMMON BRAND NAME(S): Nitroquick, Nitrostat, Nitrotab What should I tell my health care provider before I take this medicine? They need to know if you have any of these conditions: -anemia -head injury, recent stroke, or bleeding in the brain -liver disease -previous heart attack -an unusual or allergic reaction to nitroglycerin, other medicines, foods, dyes, or preservatives -pregnant or trying to get pregnant -breast-feeding How should I use this medicine? Take this medicine by mouth as needed. At the first sign of an angina attack (chest pain or tightness) place one tablet under your tongue. You can also take this medicine 5 to 10 minutes before an event likely to produce chest pain. Follow the directions on the prescription label. Let the tablet dissolve under the tongue. Do not swallow whole. Replace the dose if you accidentally swallow it. It will help if your mouth is not dry. Saliva around the tablet will help it to dissolve more quickly. Do not eat or drink, smoke or chew tobacco while a tablet is dissolving. If you are not better within 5 minutes after taking ONE dose of nitroglycerin, call 9-1-1 immediately to seek emergency medical care. Do not take more than 3 nitroglycerin tablets over 15 minutes. If you take this medicine often to relieve symptoms of angina, your doctor or health care professional may provide you with different instructions to manage your symptoms. If  symptoms do not go away after following these instructions, it is important to call 9-1-1 immediately. Do not take more than 3 nitroglycerin tablets over 15 minutes. Talk to your pediatrician regarding the use of this medicine in children. Special care may be needed. Overdosage: If you think you have taken too much of this medicine contact a poison control center or emergency room  at once. NOTE: This medicine is only for you. Do not share this medicine with others. What if I miss a dose? This does not apply. This medicine is only used as needed. What may interact with this medicine? Do not take this medicine with any of the following medications: -certain migraine medicines like ergotamine and dihydroergotamine (DHE) -medicines used to treat erectile dysfunction like sildenafil, tadalafil, and vardenafil -riociguat This medicine may also interact with the following medications: -alteplase -aspirin -heparin -medicines for high blood pressure -medicines for mental depression -other medicines used to treat angina -phenothiazines like chlorpromazine, mesoridazine, prochlorperazine, thioridazine This list may not describe all possible interactions. Give your health care provider a list of all the medicines, herbs, non-prescription drugs, or dietary supplements you use. Also tell them if you smoke, drink alcohol, or use illegal drugs. Some items may interact with your medicine. What should I watch for while using this medicine? Tell your doctor or health care professional if you feel your medicine is no longer working. Keep this medicine with you at all times. Sit or lie down when you take your medicine to prevent falling if you feel dizzy or faint after using it. Try to remain calm. This will help you to feel better faster. If you feel dizzy, take several deep breaths and lie down with your feet propped up, or bend forward with your head resting between your knees. You may get drowsy or dizzy. Do not drive, use machinery, or do anything that needs mental alertness until you know how this drug affects you. Do not stand or sit up quickly, especially if you are an older patient. This reduces the risk of dizzy or fainting spells. Alcohol can make you more drowsy and dizzy. Avoid alcoholic drinks. Do not treat yourself for coughs, colds, or pain while you are taking this medicine  without asking your doctor or health care professional for advice. Some ingredients may increase your blood pressure. What side effects may I notice from receiving this medicine? Side effects that you should report to your doctor or health care professional as soon as possible: -blurred vision -dry mouth -skin rash -sweating -the feeling of extreme pressure in the head -unusually weak or tired Side effects that usually do not require medical attention (report to your doctor or health care professional if they continue or are bothersome): -flushing of the face or neck -headache -irregular heartbeat, palpitations -nausea, vomiting This list may not describe all possible side effects. Call your doctor for medical advice about side effects. You may report side effects to FDA at 1-800-FDA-1088. Where should I keep my medicine? Keep out of the reach of children. Store at room temperature between 20 and 25 degrees C (68 and 77 degrees F). Store in Retail buyeroriginal container. Protect from light and moisture. Keep tightly closed. Throw away any unused medicine after the expiration date. NOTE: This sheet is a summary. It may not cover all possible information. If you have questions about this medicine, talk to your doctor, pharmacist, or health care provider.  2019 Elsevier/Gold Standard (2013-09-24 17:57:36)    Metoprolol extended-release tablets What is this  medicine? METOPROLOL (me TOE proe lole) is a beta-blocker. Beta-blockers reduce the workload on the heart and help it to beat more regularly. This medicine is used to treat high blood pressure and to prevent chest pain. It is also used to after a heart attack and to prevent an additional heart attack from occurring. This medicine may be used for other purposes; ask your health care provider or pharmacist if you have questions. COMMON BRAND NAME(S): toprol, Toprol XL What should I tell my health care provider before I take this medicine? They need  to know if you have any of these conditions: -diabetes -heart or vessel disease like slow heart rate, worsening heart failure, heart block, sick sinus syndrome or Raynaud's disease -kidney disease -liver disease -lung or breathing disease, like asthma or emphysema -pheochromocytoma -thyroid disease -an unusual or allergic reaction to metoprolol, other beta-blockers, medicines, foods, dyes, or preservatives -pregnant or trying to get pregnant -breast-feeding How should I use this medicine? Take this medicine by mouth with a glass of water. Follow the directions on the prescription label. Do not crush or chew. Take this medicine with or immediately after meals. Take your doses at regular intervals. Do not take more medicine than directed. Do not stop taking this medicine suddenly. This could lead to serious heart-related effects. Talk to your pediatrician regarding the use of this medicine in children. While this drug may be prescribed for children as young as 6 years for selected conditions, precautions do apply. Overdosage: If you think you have taken too much of this medicine contact a poison control center or emergency room at once. NOTE: This medicine is only for you. Do not share this medicine with others. What if I miss a dose? If you miss a dose, take it as soon as you can. If it is almost time for your next dose, take only that dose. Do not take double or extra doses. What may interact with this medicine? This medicine may interact with the following medications: -certain medicines for blood pressure, heart disease, irregular heart beat -certain medicines for depression, like monoamine oxidase (MAO) inhibitors, fluoxetine, or paroxetine -clonidine -dobutamine -epinephrine -isoproterenol -reserpine This list may not describe all possible interactions. Give your health care provider a list of all the medicines, herbs, non-prescription drugs, or dietary supplements you use. Also tell  them if you smoke, drink alcohol, or use illegal drugs. Some items may interact with your medicine. What should I watch for while using this medicine? Visit your doctor or health care professional for regular check ups. Contact your doctor right away if your symptoms worsen. Check your blood pressure and pulse rate regularly. Ask your health care professional what your blood pressure and pulse rate should be, and when you should contact them. You may get drowsy or dizzy. Do not drive, use machinery, or do anything that needs mental alertness until you know how this medicine affects you. Do not sit or stand up quickly, especially if you are an older patient. This reduces the risk of dizzy or fainting spells. Contact your doctor if these symptoms continue. Alcohol may interfere with the effect of this medicine. Avoid alcoholic drinks. What side effects may I notice from receiving this medicine? Side effects that you should report to your doctor or health care professional as soon as possible: -allergic reactions like skin rash, itching or hives -cold or numb hands or feet -depression -difficulty breathing -faint -fever with sore throat -irregular heartbeat, chest pain -rapid weight gain -swollen legs  or ankles Side effects that usually do not require medical attention (report to your doctor or health care professional if they continue or are bothersome): -anxiety or nervousness -change in sex drive or performance -dry skin -headache -nightmares or trouble sleeping -short term memory loss -stomach upset or diarrhea -unusually tired This list may not describe all possible side effects. Call your doctor for medical advice about side effects. You may report side effects to FDA at 1-800-FDA-1088. Where should I keep my medicine? Keep out of the reach of children. Store at room temperature between 15 and 30 degrees C (59 and 86 degrees F). Throw away any unused medicine after the expiration  date. NOTE: This sheet is a summary. It may not cover all possible information. If you have questions about this medicine, talk to your doctor, pharmacist, or health care provider.  2019 Elsevier/Gold Standard (2013-07-31 14:41:37)

## 2019-05-29 ENCOUNTER — Other Ambulatory Visit: Payer: Self-pay | Admitting: Cardiology

## 2019-05-29 DIAGNOSIS — I2510 Atherosclerotic heart disease of native coronary artery without angina pectoris: Secondary | ICD-10-CM

## 2019-05-29 DIAGNOSIS — Q211 Atrial septal defect: Secondary | ICD-10-CM

## 2019-05-29 DIAGNOSIS — E782 Mixed hyperlipidemia: Secondary | ICD-10-CM

## 2019-05-29 DIAGNOSIS — I129 Hypertensive chronic kidney disease with stage 1 through stage 4 chronic kidney disease, or unspecified chronic kidney disease: Secondary | ICD-10-CM

## 2019-05-29 DIAGNOSIS — Q2112 Patent foramen ovale: Secondary | ICD-10-CM

## 2019-05-29 DIAGNOSIS — N182 Chronic kidney disease, stage 2 (mild): Secondary | ICD-10-CM

## 2019-06-05 ENCOUNTER — Other Ambulatory Visit: Payer: Self-pay

## 2019-06-05 ENCOUNTER — Ambulatory Visit (HOSPITAL_COMMUNITY)
Admission: RE | Admit: 2019-06-05 | Discharge: 2019-06-05 | Disposition: A | Discharge status: Home / Self Care | Payer: BC Managed Care – PPO | Source: Ambulatory Visit | Attending: Cardiology | Admitting: Cardiology

## 2019-06-05 DIAGNOSIS — R918 Other nonspecific abnormal finding of lung field: Secondary | ICD-10-CM | POA: Diagnosis not present

## 2019-09-22 ENCOUNTER — Telehealth: Payer: Self-pay | Admitting: Cardiology

## 2019-09-22 NOTE — Telephone Encounter (Signed)
Spoke with patient regarding symptoms he has been experiencing recently. He states that he has been under PCP and neurology care for going on 1 year due to having "muslce cramps and muscle twitches." He had neurology evaluation to rule out ALS.  Patient states that his PCP then advised that he hold his Crestor for several days to see if symptoms would resolve.    Patient does not report any muscle cramping, aching, or twitching since being office of Crestor.  He would like to know if there is another statin he can try.  He has tried pravastatin in the past.  Please advise.

## 2019-09-22 NOTE — Telephone Encounter (Signed)
Patient states that he was having pain due to choleterol meds and he is now feeling much better since  Stopped taking the Crestor.. Please call patient and let them know if ok to stay off cholesterol meds til December follow up.Marland Kitchen

## 2019-09-25 NOTE — Telephone Encounter (Signed)
Sure, if pravastatin worked for him in the past. We can restart it. He can start Pravastatin 40mg  daily.

## 2019-09-25 NOTE — Telephone Encounter (Signed)
Left message for patient to return call.  Will continue efforts.  

## 2019-09-28 MED ORDER — PRAVASTATIN SODIUM 40 MG PO TABS: 40.0000 mg | ORAL_TABLET | Freq: Every evening | ORAL | 0 refills | Status: DC

## 2019-09-28 NOTE — Telephone Encounter (Signed)
Patient returned call and was advised to stop Crestor and start pravastatin 40mg  one tablet daily per Dr Harriet Masson.  Patient agreed to plan and verbalized understanding.  Rx sent to pharmacy.  Patient scheduled for follow up with Dr Bettina Gavia in Dec 2020.  No further questions.

## 2019-09-28 NOTE — Addendum Note (Signed)
Addended by: Stevan Born on: 09/28/2019 02:40 PM   Modules accepted: Orders

## 2019-11-25 ENCOUNTER — Other Ambulatory Visit: Payer: Self-pay | Admitting: Cardiology

## 2019-11-25 DIAGNOSIS — N182 Chronic kidney disease, stage 2 (mild): Secondary | ICD-10-CM

## 2019-11-25 DIAGNOSIS — I129 Hypertensive chronic kidney disease with stage 1 through stage 4 chronic kidney disease, or unspecified chronic kidney disease: Secondary | ICD-10-CM

## 2019-11-25 DIAGNOSIS — Q2112 Patent foramen ovale: Secondary | ICD-10-CM

## 2019-11-25 DIAGNOSIS — E782 Mixed hyperlipidemia: Secondary | ICD-10-CM

## 2019-11-25 DIAGNOSIS — Q211 Atrial septal defect: Secondary | ICD-10-CM

## 2019-11-25 DIAGNOSIS — I2510 Atherosclerotic heart disease of native coronary artery without angina pectoris: Secondary | ICD-10-CM

## 2019-12-08 ENCOUNTER — Ambulatory Visit: Payer: BC Managed Care – PPO | Admitting: Cardiology

## 2019-12-15 NOTE — Progress Notes (Signed)
Cardiology Office Note:    Date:  12/16/2019   ID:  Lee Stevens, DOB [birth year], MRN [id number]  PCP:  Algis Greenhouse, MD  Cardiologist:  Shirlee More, MD    Referring MD: Algis Greenhouse, MD please check LP(a) with his next lipid profile   ASSESSMENT:    1. CAD in native artery   2. PFO (patent foramen ovale)   3. Hypertensive kidney disease with stage 2 chronic kidney disease   4. Hyperlipidemia, mixed    PLAN:    In order of problems listed above:  1. Stable CAD having no anginal discomfort continue medical treatment recent increase in his Lipitor dosage and if LDL remains greater than 70 I would place him on Zetia along with a statin and is statin intolerant PCSK9. 2. Stable asymptomatic is on aspirin 3. BP at target continue current treatment beta-blocker ARB 4. LDL remains above goal is a atorvastatin was increased and if he remains greater than 70 would benefit from Zetia or PCSK9 is a poorly statin intolerant.   Next appointment: 1 year   Medication Adjustments/Labs and Tests Ordered: Current medicines are reviewed at length with the patient today.  Concerns regarding medicines are outlined above.  No orders of the defined types were placed in this encounter.  No orders of the defined types were placed in this encounter.   No chief complaint on file.   History of Present Illness:    Lee Stevens is a 59 y.o. male with a hx of coronary atherosclerosis with luminal irregularity on left heart cath in 2008 hypertension, CKD,hyperlipidemia and prediabetes lseen by me 09/25/2018 for chest pain.  Cardiac CTA showed moderate CAD with stenosis in the mid right coronary artery proximal PDA proximal posterior lateral branch proximal and mid LAD ramus and subsequent fractional flow reserve did not show flow-limiting stenosis.  He also was noted to have patent foramen ovale.  Most severe stenosis was in the range of 50 to 69%.   He was last seen 12/04/2018. Compliance with  diet, lifestyle and medications: Yes  Practices all the good preventive mechanisms of COVID-19 and at my request will start to wear medical eye protection in public.  He has had no chest pain shortness of breath palpitation or syncope but remains poorly tolerant of statins presently can tolerate a atorvastatin dose was recently doubled. Past Medical History:  Diagnosis Date  . Anxiety 07/25/2017   2018  . Aseptic necrosis of bone of hip ([place]) 06/26/2016  . 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 Benign hypertension 01/19/2016  . Chest pain in adult 03/11/2016   Seen at [facility] ED 12/08/15 by DR Maxwell Caul, EKG CXR and Troponin normal, elevated CRP and treated as pericarditis    Stress echo negative for ischemia 2013 at 13 Mets  . Chronic kidney disease, stage 3 04/19/2017  . GERD (gastroesophageal reflux disease) 06/26/2016  . History of closure of ileostomy 07/03/2017  . Hyperlipidemia, mixed 06/26/2016  . Hypomagnesemia 08/04/2017   2018: 1.7  . Hyponatremia 06/26/2017  . 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 Obstructive sleep apnea 06/26/2016  . Prediabetes 07/02/2016   2018: 121/5.6  . SBO (small bowel obstruction) ([place]) 06/24/2017  . Subclinical hypothyroidism 06/26/2016    Past Surgical History:  Procedure Laterality Date  . APPENDECTOMY    . CARDIAC CATHETERIZATION    . CHOLECYSTECTOMY    . ILEOSTOMY    . TONSILLECTOMY AND ADENOIDECTOMY    . TOTAL HIP ARTHROPLASTY  Current Medications: Current Meds  Medication Sig  . aspirin EC 81 MG tablet Take 81 mg by mouth daily.  Marland Kitchen atorvastatin (LIPITOR) 40 MG tablet Take 1 tablet by mouth daily.  . Coenzyme Q10 (CO Q 10 PO) Take 100 mg by mouth daily.  Marland Kitchen levothyroxine (SYNTHROID) 25 MCG tablet Take 1 tablet by mouth daily.  . metoprolol succinate (TOPROL-XL) 25 MG 24 hr tablet Take 1 tablet (25 mg total) by mouth daily. NEEDS OFFICE VISIT FOR MORE REFILLS  . Multiple Vitamin (MULTIVITAMIN) tablet Take 1 tablet by mouth daily.  Marland Kitchen  olmesartan (BENICAR) 20 MG tablet TAKE 1 TABLET EACH AM FOR HIGH BLOOD PRESSURE  . Omega-3 Fatty Acids (FISH OIL) 1200 MG CAPS Take 2-4 grams by mouth daily.  Marland Kitchen omeprazole (PRILOSEC) 40 MG capsule TAKE 1 CAPSULE DAILY FOR REFLUX  . [DISCONTINUED] pravastatin (PRAVACHOL) 40 MG tablet Take 1 tablet (40 mg total) by mouth every evening.     Allergies:   Cephalexin   Social History   Socioeconomic History  . Marital status: Married    Spouse name: Not on file  . Number of children: Not on file  . Years of education: Not on file  . Highest education level: Not on file  Occupational History  . Not on file  Tobacco Use  . Smoking status: Never Smoker  . Smokeless tobacco: Former Neurosurgeon    Types: Chew  Substance and Sexual Activity  . Alcohol use: Not Currently  . Drug use: Never  . Sexual activity: Not on file  Other Topics Concern  . Not on file  Social History Narrative  . Not on file   Social Determinants of Health   Financial Resource Strain:   . Difficulty of Paying Living Expenses: Not on file  Food Insecurity:   . Worried About Programme researcher, broadcasting/film/video in the Last Year: Not on file  . Ran Out of Food in the Last Year: Not on file  Transportation Needs:   . Lack of Transportation (Medical): Not on file  . Lack of Transportation (Non-Medical): Not on file  Physical Activity:   . Days of Exercise per Week: Not on file  . Minutes of Exercise per Session: Not on file  Stress:   . Feeling of Stress : Not on file  Social Connections:   . Frequency of Communication with Friends and Family: Not on file  . Frequency of Social Gatherings with Friends and Family: Not on file  . Attends Religious Services: Not on file  . Active Member of Clubs or Organizations: Not on file  . Attends Banker Meetings: Not on file  . Marital Status: Not on file     Family History: The patient's family history includes Heart attack in his father; Hyperlipidemia in his father;  Hypertension in his mother. ROS:   Please see the history of present illness.    All other systems reviewed and are negative.  EKGs/Labs/Other Studies Reviewed:    The following studies were reviewed today:  EKG:  EKG ordered today and personally reviewed.  The ekg ordered today demonstrates sinus rhythm normal  Recent Labs:  10/22/2019:  Chol 144 TG 233 HDL 38 LDL 88 TSH 7.61   CR 1.79 K 3.8   Physical Exam:    VS:  BP 110/68   Pulse 68   Ht 6\' 3"  (1.905 m)   Wt 231 lb (104.8 kg)   SpO2 95%   BMI 28.87 kg/m  Wt Readings from Last 3 Encounters:  12/16/19 231 lb (104.8 kg)  12/04/18 223 lb 1.9 oz (101.2 kg)  09/25/18 222 lb 12.8 oz (101.1 kg)     GEN: No xanthoma or xanthelasma well nourished, well developed in no acute distress HEENT: Normal NECK: No JVD; No carotid bruits LYMPHATICS: No lymphadenopathy CARDIAC: RRR, no murmurs, rubs, gallops RESPIRATORY:  Clear to auscultation without rales, wheezing or rhonchi  ABDOMEN: Soft, non-tender, non-distended MUSCULOSKELETAL:  No edema; No deformity  SKIN: Warm and dry NEUROLOGIC:  Alert and oriented x 3 PSYCHIATRIC:  Normal affect    Signed, Norman Herrlich, MD  12/16/2019 9:18 AM    [facility]

## 2019-12-16 ENCOUNTER — Ambulatory Visit (INDEPENDENT_AMBULATORY_CARE_PROVIDER_SITE_OTHER): Payer: 59 | Admitting: Cardiology

## 2019-12-16 ENCOUNTER — Other Ambulatory Visit: Payer: Self-pay

## 2019-12-16 ENCOUNTER — Encounter: Payer: Self-pay | Admitting: Cardiology

## 2019-12-16 VITALS — BP 110/68 | HR 68 | Ht 75.0 in | Wt 231.0 lb

## 2019-12-16 DIAGNOSIS — Q211 Atrial septal defect: Secondary | ICD-10-CM | POA: Diagnosis not present

## 2019-12-16 DIAGNOSIS — N182 Chronic kidney disease, stage 2 (mild): Secondary | ICD-10-CM

## 2019-12-16 DIAGNOSIS — Q2112 Patent foramen ovale: Secondary | ICD-10-CM

## 2019-12-16 DIAGNOSIS — E782 Mixed hyperlipidemia: Secondary | ICD-10-CM | POA: Diagnosis not present

## 2019-12-16 DIAGNOSIS — I129 Hypertensive chronic kidney disease with stage 1 through stage 4 chronic kidney disease, or unspecified chronic kidney disease: Secondary | ICD-10-CM | POA: Diagnosis not present

## 2019-12-16 DIAGNOSIS — I2510 Atherosclerotic heart disease of native coronary artery without angina pectoris: Secondary | ICD-10-CM | POA: Diagnosis not present

## 2019-12-16 NOTE — Patient Instructions (Addendum)
Medication Instructions:  Your physician recommends that you continue on your current medications as directed. Please refer to the Current Medication list given to you today.  *If you need a refill on your cardiac medications before your next appointment, please call your pharmacy*  Lab Work: None ordered  If you have labs (blood work) drawn today and your tests are completely normal, you will receive your results only by: Marland Kitchen MyChart Message (if you have MyChart) OR . A paper copy in the mail If you have any lab test that is abnormal or we need to change your treatment, we will call you to review the results.  Testing/Procedures: None ordered  Follow-Up: At [facility], you and your health needs are our priority.  As part of our continuing mission to provide you with exceptional heart care, we have created designated Provider Care Teams.  These Care Teams include your primary Cardiologist (physician) and Advanced Practice Providers (APPs -  Physician Assistants and Nurse Practitioners) who all work together to provide you with the care you need, when you need it.  Your next appointment:   12 month(s)  The format for your next appointment:   In Person  Provider:   Norman Herrlich, MD    Purchase on line at [facility] or at [facility]

## 2019-12-30 ENCOUNTER — Other Ambulatory Visit: Payer: Self-pay | Admitting: Cardiology

## 2020-02-18 ENCOUNTER — Other Ambulatory Visit: Payer: Self-pay | Admitting: Cardiology

## 2020-02-18 DIAGNOSIS — Q2112 Patent foramen ovale: Secondary | ICD-10-CM

## 2020-02-18 DIAGNOSIS — E782 Mixed hyperlipidemia: Secondary | ICD-10-CM

## 2020-02-18 DIAGNOSIS — N182 Chronic kidney disease, stage 2 (mild): Secondary | ICD-10-CM

## 2020-02-18 DIAGNOSIS — I2510 Atherosclerotic heart disease of native coronary artery without angina pectoris: Secondary | ICD-10-CM

## 2020-02-18 DIAGNOSIS — I129 Hypertensive chronic kidney disease with stage 1 through stage 4 chronic kidney disease, or unspecified chronic kidney disease: Secondary | ICD-10-CM

## 2020-02-18 DIAGNOSIS — Q211 Atrial septal defect: Secondary | ICD-10-CM

## 2020-03-31 ENCOUNTER — Other Ambulatory Visit: Payer: Self-pay | Admitting: Cardiology

## 2020-03-31 DIAGNOSIS — E782 Mixed hyperlipidemia: Secondary | ICD-10-CM

## 2020-03-31 DIAGNOSIS — Q211 Atrial septal defect: Secondary | ICD-10-CM

## 2020-03-31 DIAGNOSIS — I2510 Atherosclerotic heart disease of native coronary artery without angina pectoris: Secondary | ICD-10-CM

## 2020-03-31 DIAGNOSIS — I129 Hypertensive chronic kidney disease with stage 1 through stage 4 chronic kidney disease, or unspecified chronic kidney disease: Secondary | ICD-10-CM

## 2020-03-31 DIAGNOSIS — Q2112 Patent foramen ovale: Secondary | ICD-10-CM

## 2020-03-31 MED ORDER — METOPROLOL SUCCINATE ER 25 MG PO TB24: 25.0000 mg | ORAL_TABLET | Freq: Every day | ORAL | 1 refills | Status: DC

## 2020-03-31 NOTE — Telephone Encounter (Signed)
Left patient a message letting him know that we have sent in the refill on his metoprolol.

## 2020-03-31 NOTE — Telephone Encounter (Signed)
*  STAT* If patient is at the pharmacy, call can be transferred to refill team.   1. Which medications need to be refilled? (please list name of each medication and dose if known)  metoprolol succinate (TOPROL-XL) 25 MG 24 hr tablet  2. Which pharmacy/location (including street and city if local pharmacy) is medication to be sent to? [facility] - [place], [place] - [address]  3. Do they need a 30 day or 90 day supply? 90  Patient is changing pharmacies and needs a new rx sent over. His old rx did not have any refills

## 2020-07-17 IMAGING — CT CT CHEST WITHOUT CONTRAST
2 of 3 series · 15 of 36 positions shown, 18 images · non-contrast
Comparison: 11/14/2018

CLINICAL DATA: Pulmonary nodule.

EXAM:
CT CHEST WITHOUT CONTRAST
TECHNIQUE: Multidetector CT imaging of the chest was performed following the
standard protocol without IV contrast.

[Series 2: thorax · axial · 0.80mm/px · z∈[-333,-41]mm · 12 of 172 slices shown, 15 images]
[im 13/172  mediastinal]
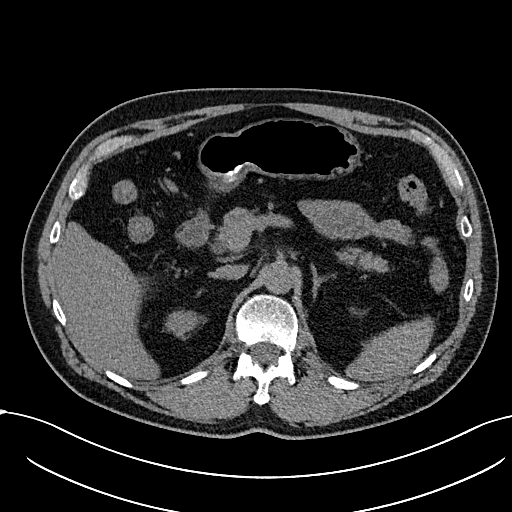
[im 13/172  lung]
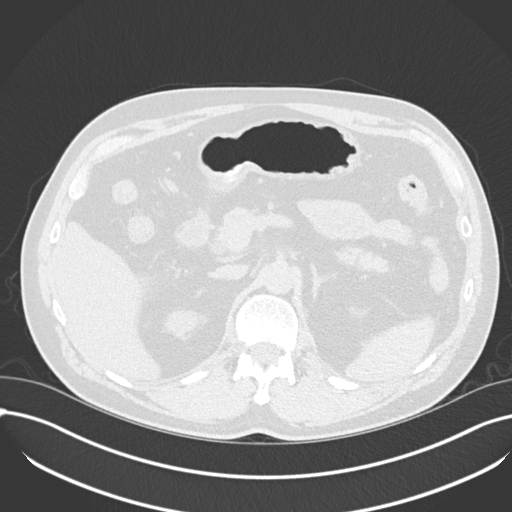
[im 26/172  lung]
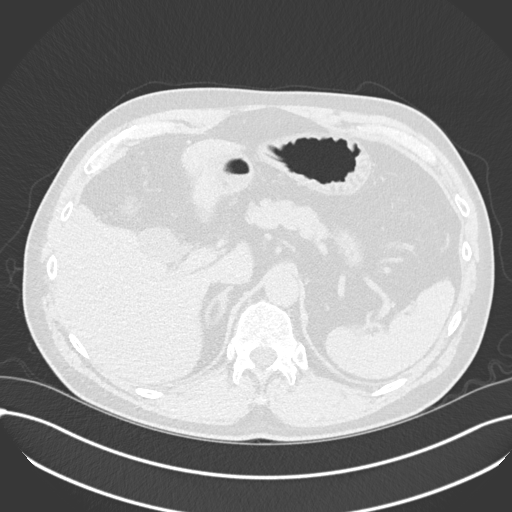
[im 39/172  lung]
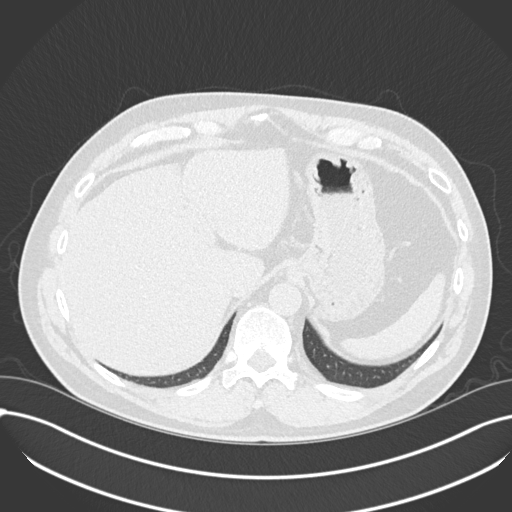
[im 51/172  lung]
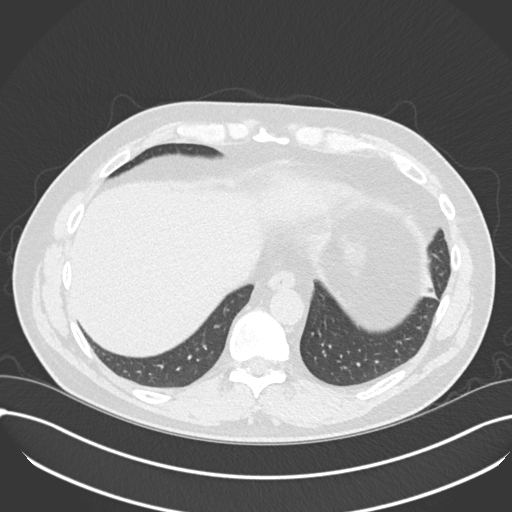
[im 64/172  mediastinal]
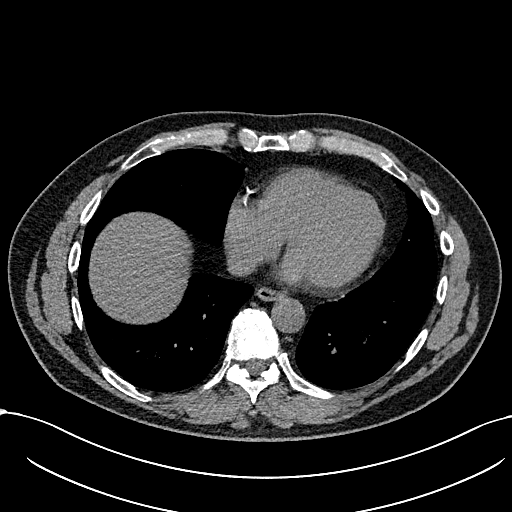
[im 64/172  lung]
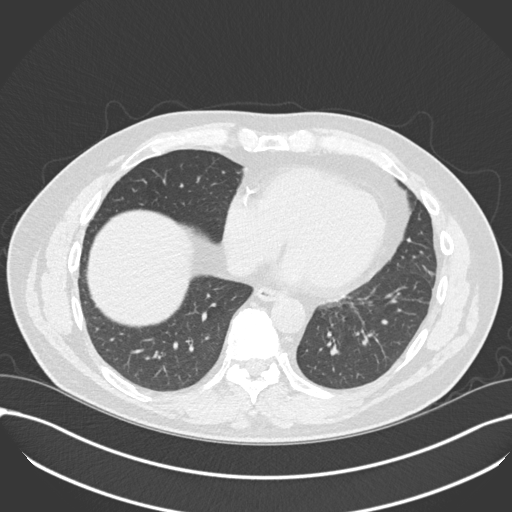
[im 77/172  lung]
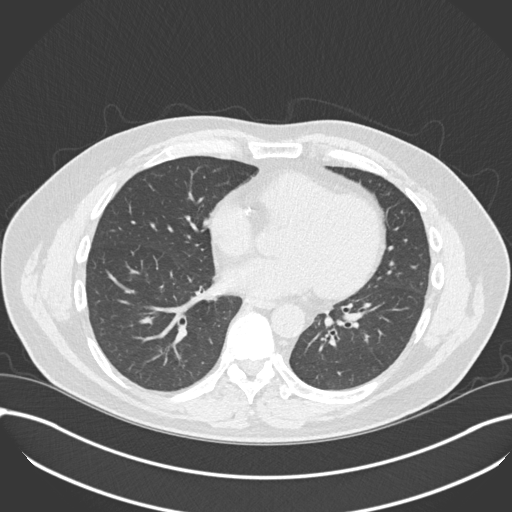
[im 96/172  lung]
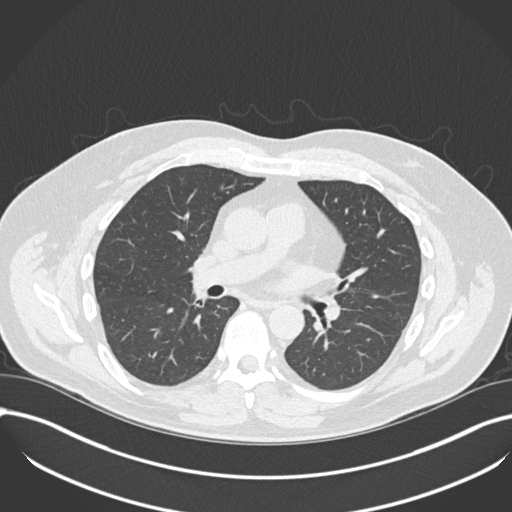
[im 108/172  lung]
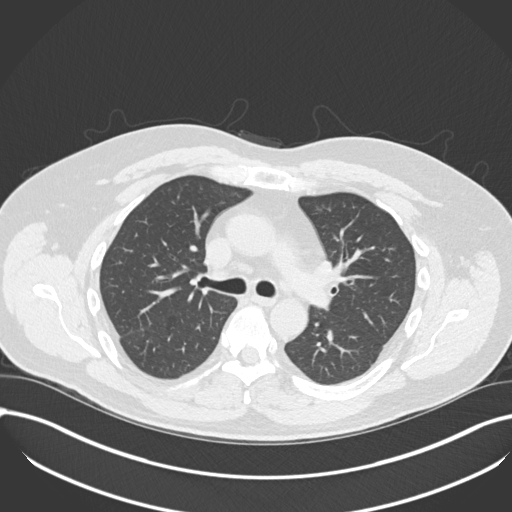
[im 121/172  mediastinal]
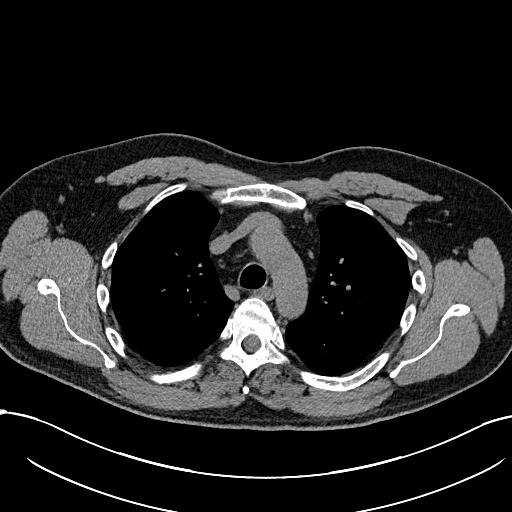
[im 121/172  lung]
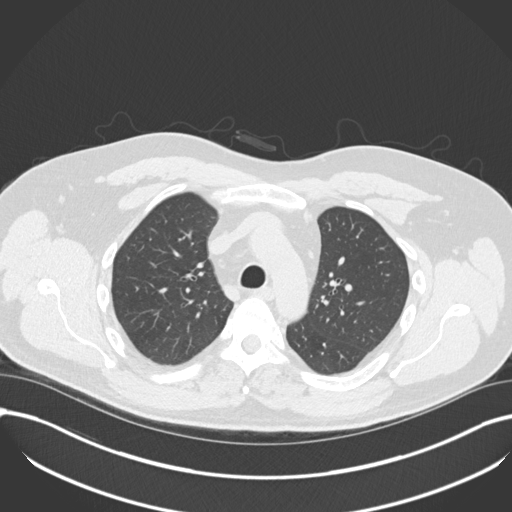
[im 134/172  lung]
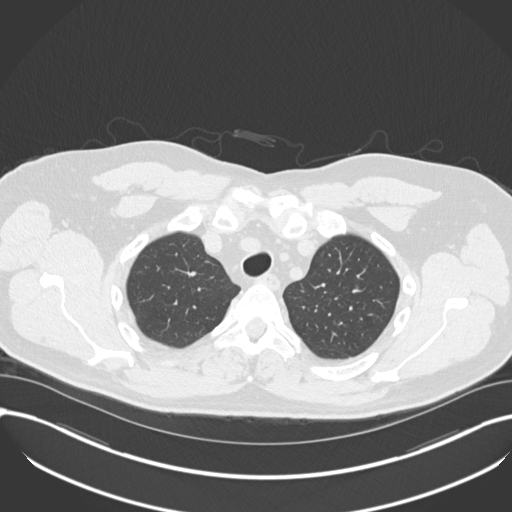
[im 146/172  lung]
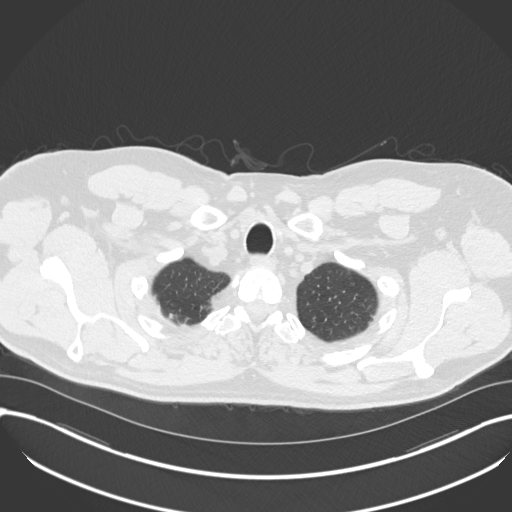
[im 159/172  lung]
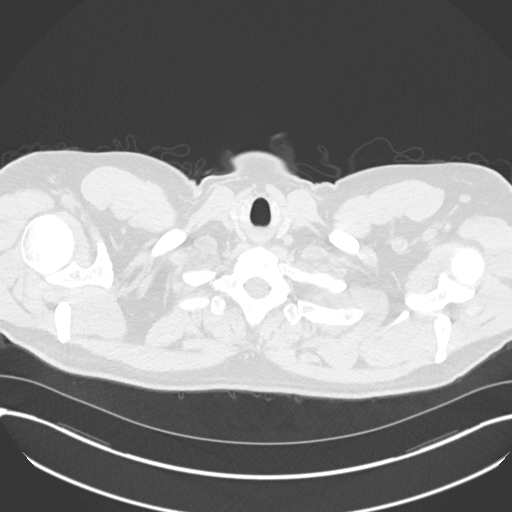

[Series 6: coronal · coronal · 0.67mm/px · 3 of 151 slices shown]
[im 31/151  lung]
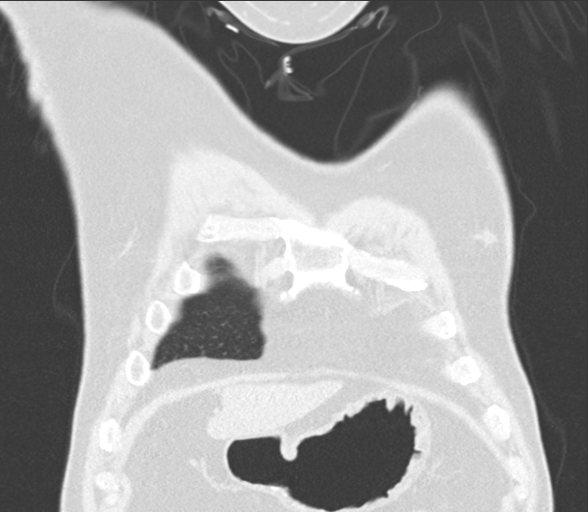
[im 61/151  lung]
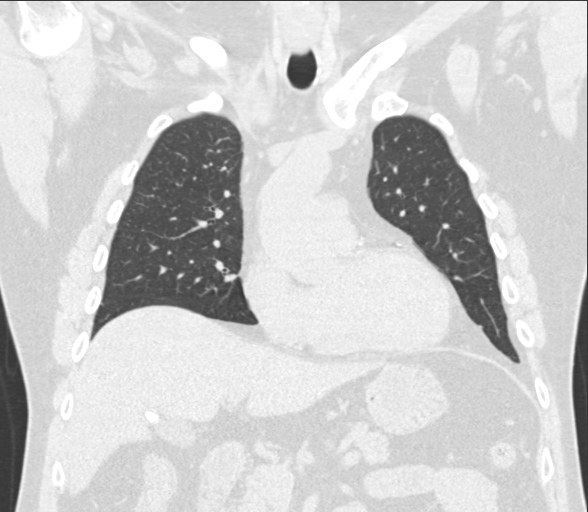
[im 91/151  lung]
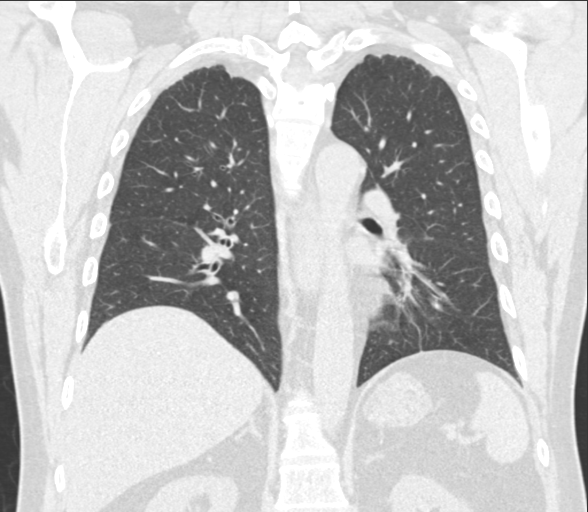

[15 of 36 positions shown; findings below may reference images not displayed]

FINDINGS: Cardiovascular: The heart size is normal. No substantial pericardial
effusion. Coronary artery calcification is evident. No thoracic
aortic aneurysm.

Mediastinum/Nodes: No mediastinal lymphadenopathy. No evidence for
gross hilar lymphadenopathy although assessment is limited by the
lack of intravenous contrast on today's study. The esophagus has
normal imaging features. There is no axillary lymphadenopathy.

Lungs/Pleura: 2 mm right upper lobe nodule identified on image
59/series 5. 7 x 4 mm right lower lobe pulmonary nodule (91/5) is
stable since prior study. No suspicious nodule or mass in the left
lung. No focal consolidation or pleural effusion.

Upper Abdomen: The liver shows diffusely decreased attenuation
suggesting fat deposition. Cystic structure in the gallbladder fossa
similar to prior and may represent marked dilatation of the cystic
duct in this patient status post cholecystectomy. There is no
evidence for biliary ductal dilatation.

Musculoskeletal: No worrisome lytic or sclerotic osseous
abnormality.
IMPRESSION: 1. Stable 7 mm right lower lobe pulmonary nodule with 2 mm nodule
identified in the right upper lobe on today's exam. Non-contrast
chest CT 12 months (from today's scan) is considered optional for
low-risk patients, but is recommended for high-risk patients. This
recommendation follows the consensus statement: Guidelines for
Management of Incidental Pulmonary Nodules Detected on CT Images:
2. Hepatic steatosis

## 2020-09-28 ENCOUNTER — Other Ambulatory Visit: Payer: Self-pay | Admitting: Cardiology

## 2020-09-28 DIAGNOSIS — I129 Hypertensive chronic kidney disease with stage 1 through stage 4 chronic kidney disease, or unspecified chronic kidney disease: Secondary | ICD-10-CM

## 2020-09-28 DIAGNOSIS — E782 Mixed hyperlipidemia: Secondary | ICD-10-CM

## 2020-09-28 DIAGNOSIS — N182 Chronic kidney disease, stage 2 (mild): Secondary | ICD-10-CM

## 2020-09-28 DIAGNOSIS — Q2112 Patent foramen ovale: Secondary | ICD-10-CM

## 2020-09-28 DIAGNOSIS — Q211 Atrial septal defect: Secondary | ICD-10-CM

## 2020-09-28 DIAGNOSIS — I2510 Atherosclerotic heart disease of native coronary artery without angina pectoris: Secondary | ICD-10-CM

## 2020-10-10 DIAGNOSIS — K6289 Other specified diseases of anus and rectum: Secondary | ICD-10-CM

## 2020-10-10 HISTORY — DX: Other specified diseases of anus and rectum: K62.89

## 2020-12-27 ENCOUNTER — Other Ambulatory Visit: Payer: Self-pay | Admitting: Cardiology

## 2020-12-27 DIAGNOSIS — Q211 Atrial septal defect: Secondary | ICD-10-CM

## 2020-12-27 DIAGNOSIS — Q2112 Patent foramen ovale: Secondary | ICD-10-CM

## 2020-12-27 DIAGNOSIS — I2510 Atherosclerotic heart disease of native coronary artery without angina pectoris: Secondary | ICD-10-CM

## 2020-12-27 DIAGNOSIS — I129 Hypertensive chronic kidney disease with stage 1 through stage 4 chronic kidney disease, or unspecified chronic kidney disease: Secondary | ICD-10-CM

## 2020-12-27 DIAGNOSIS — E782 Mixed hyperlipidemia: Secondary | ICD-10-CM

## 2021-01-13 ENCOUNTER — Telehealth: Payer: Self-pay | Admitting: Cardiology

## 2021-01-13 DIAGNOSIS — Q211 Atrial septal defect: Secondary | ICD-10-CM

## 2021-01-13 DIAGNOSIS — E782 Mixed hyperlipidemia: Secondary | ICD-10-CM

## 2021-01-13 DIAGNOSIS — I129 Hypertensive chronic kidney disease with stage 1 through stage 4 chronic kidney disease, or unspecified chronic kidney disease: Secondary | ICD-10-CM

## 2021-01-13 DIAGNOSIS — I2510 Atherosclerotic heart disease of native coronary artery without angina pectoris: Secondary | ICD-10-CM

## 2021-01-13 DIAGNOSIS — Q2112 Patent foramen ovale: Secondary | ICD-10-CM

## 2021-01-13 MED ORDER — METOPROLOL SUCCINATE ER 25 MG PO TB24: ORAL_TABLET | ORAL | 0 refills | Status: DC

## 2021-01-13 NOTE — Telephone Encounter (Signed)
30 day refill sent in as patient needs follow up visit for further refills. This is our second attempt.

## 2021-01-13 NOTE — Telephone Encounter (Signed)
*  STAT* If patient is at the pharmacy, call can be transferred to refill team.   1. Which medications need to be refilled? (please list name of each medication and dose if known) Metoprolol  2. Which pharmacy/location (including street and city if local pharmacy) is medication to be sent to? [facility], [place], [place]   3. Do they need a 30 day or 90 day supply? 90 days and refills

## 2021-01-13 NOTE — Addendum Note (Signed)
Addended by: Delorse Limber I on: 01/13/2021 08:49 AM   Modules accepted: Orders

## 2021-02-14 DIAGNOSIS — R0981 Nasal congestion: Secondary | ICD-10-CM | POA: Insufficient documentation

## 2021-02-14 DIAGNOSIS — J342 Deviated nasal septum: Secondary | ICD-10-CM

## 2021-02-14 DIAGNOSIS — J343 Hypertrophy of nasal turbinates: Secondary | ICD-10-CM | POA: Insufficient documentation

## 2021-02-14 HISTORY — DX: Hypertrophy of nasal turbinates: J34.3

## 2021-02-14 HISTORY — DX: Deviated nasal septum: J34.2

## 2021-02-14 HISTORY — DX: Nasal congestion: R09.81

## 2021-03-07 NOTE — Progress Notes (Signed)
Cardiology Office Note:    Date:  03/08/2021   ID:  Lee Stevens, DOB Apr 11, 1961, MRN [id number]  PCP:  Olive Bass, MD  Cardiologist:  Norman Herrlich, MD    Referring MD: Olive Bass, MD    ASSESSMENT:    1. CAD in native artery   2. Hypertensive kidney disease with stage 3a chronic kidney disease (HCC)   3. Hyperlipidemia, mixed    PLAN:    In order of problems listed above:  1. Stable CAD having no angina current medical therapy [place] Heart Association class I continue medical therapy aspirin high intensity statin beta-blocker and antihypertensive. 2. At target continue treatment ARB beta-blocker 3. Continue statin if subsequent fasting triglycerides exceed 150 would benefit from icosapent ethyl with CAD   Next appointment: 1 year   Medication Adjustments/Labs and Tests Ordered: Current medicines are reviewed at length with the patient today.  Concerns regarding medicines are outlined above.  No orders of the defined types were placed in this encounter.  No orders of the defined types were placed in this encounter.   Chief Complaint  Patient presents with  . Follow-up  . Coronary Artery Disease    History of Present Illness:    Lee Stevens is a 60 y.o. male with a hx of CAD hypertension hyperlipidemia and prediabetes. Cardiac CTA showed moderate CAD with stenosis in the mid right coronary artery proximal PDA proximal posterior lateral branch proximal and mid LAD ramus and subsequent fractional flow reserve did not show flow-limiting stenosis.  He also was noted to have patent foramen ovale.   He was last seen 12/16/2019. Compliance with diet, lifestyle and medications: Yes  This visit was initiated because of need for refill of the prescription. Told in the future he can send me a MyChart message. He has had no cardiovascular symptoms of chest pain shortness of breath palpitation syncope tolerates his medications including his statin without muscle  pain or weakness. I reviewed his recent labs with him. Past Medical History:  Diagnosis Date  . Anxiety 07/25/2017   2018  . 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 Benign hypertension 01/19/2016  . Chest pain in adult 03/11/2016   Se[facility] at [facility] ED 12/08/15 by DR Earl Gala, EKG CXR and Troponin normal, elevated CRP and treated as pericarditis    Stress echo negative for ischemia 2013 at 13 Mets  . Chronic kidney disease, stage 3 04/19/2017  . GERD (gastroesophageal reflux disease) 06/26/2016  . History of closure of ileostomy 07/03/2017  . Hyperlipidemia, mixed 06/26/2016  . Hypomagnesemia 08/04/2017   2018: 1.7  . Hyponatremia 06/26/2017  . 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 Obstructive sleep apnea 06/26/2016  . Prediabetes 07/02/2016   2018: 121/5.6  . SBO (small bowel obstruction) (HCC) 06/24/2017  . Subclinical hypothyroidism 06/26/2016    Past Surgical History:  Procedure Laterality Date  . APPENDECTOMY    . CARDIAC CATHETERIZATION    . CHOLECYSTECTOMY    . ILEOSTOMY    . TONSILLECTOMY AND ADENOIDECTOMY    . TOTAL HIP ARTHROPLASTY      Current Medications: Current Meds  Medication Sig  . aspirin EC 81 MG tablet Take 81 mg by mouth daily.  Marland Kitchen atorvastatin (LIPITOR) 40 MG tablet Take 1 tablet by mouth daily.  Marland Kitchen levothyroxine (SYNTHROID) 25 MCG tablet Take 1 tablet by mouth daily.  . metoprolol succinate (TOPROL-XL) 25 MG 24 hr tablet TAKE ONE (1)  TABLET EACH DAY.  . Multiple Vitamin (MULTIVITAMIN) tablet Take 1 tablet by mouth daily.  . nitroGLYCERIN (NITROSTAT) 0.4 MG SL tablet Place 1 tablet (0.4 mg total) under the tongue every 5 (five) minutes as needed for chest pain.  Marland Kitchen olmesartan (BENICAR) 20 MG tablet TAKE 1 TABLET EACH AM FOR HIGH BLOOD PRESSURE     Allergies:   Cephalexin, Hydrochlorothiazide, and Rosuvastatin   Social History   Socioeconomic History  . Marital status: Married    Spouse name: Not on file  .  Number of children: Not on file  . Years of education: Not on file  . Highest education level: Not on file  Occupational History  . Not on file  Tobacco Use  . Smoking status: Never Smoker  . Smokeless tobacco: Former Neurosurgeon    Types: Engineer, drilling  . Vaping Use: Never used  Substance and Sexual Activity  . Alcohol use: Not Currently  . Drug use: Never  . Sexual activity: Not on file  Other Topics Concern  . Not on file  Social History Narrative  . Not on file   Social Determinants of Health   Financial Resource Strain: Not on file  Food Insecurity: Not on file  Transportation Needs: Not on file  Physical Activity: Not on file  Stress: Not on file  Social Connections: Not on file     Family History: The patient's family history includes Heart attack in his father; Hyperlipidemia in his father; Hypertension in his mother. ROS:   Please see the history of present illness.    All other systems reviewed and are negative.  EKGs/Labs/Other Studies Reviewed:    The following studies were reviewed today:  EKG:  EKG ordered today and personally reviewed.  The ekg ordered today demonstrates sinus rhythm left anterior hemiblock otherwise normal  Recent Labs: No results found for requested labs within last 8760 hours.  Recent Lipid Panel 10/21/2020: LDL cholesterol at target 56 total 109 triglycerides 244 HDL 34 CMP with a potassium 4.6 creatinine 1.65 GFR 45 cc/min stage IIIa CKD and normal liver function test Physical Exam:    VS:  There were no vitals taken for this visit.    Wt Readings from Last 3 Encounters:  12/16/19 231 lb (104.8 kg)  12/04/18 223 lb 1.9 oz (101.2 kg)  09/25/18 222 lb 12.8 oz (101.1 kg)     GEN:  Well nourished, well developed in no acute distress HEENT: Normal NECK: No JVD; No carotid bruits LYMPHATICS: No lymphadenopathy CARDIAC: RRR, no murmurs, rubs, gallops RESPIRATORY:  Clear to auscultation without rales, wheezing or rhonchi   ABDOMEN: Soft, non-tender, non-distended MUSCULOSKELETAL:  No edema; No deformity  SKIN: Warm and dry NEUROLOGIC:  Alert and oriented x 3 PSYCHIATRIC:  Normal affect    Signed, Norman Herrlich, MD  03/08/2021 12:54 PM     [facility]

## 2021-03-08 ENCOUNTER — Ambulatory Visit (INDEPENDENT_AMBULATORY_CARE_PROVIDER_SITE_OTHER): Payer: 59 | Admitting: Cardiology

## 2021-03-08 ENCOUNTER — Encounter: Payer: Self-pay | Admitting: Cardiology

## 2021-03-08 ENCOUNTER — Other Ambulatory Visit: Payer: Self-pay

## 2021-03-08 VITALS — BP 128/80 | HR 67 | Ht 75.0 in | Wt 236.0 lb

## 2021-03-08 DIAGNOSIS — N182 Chronic kidney disease, stage 2 (mild): Secondary | ICD-10-CM

## 2021-03-08 DIAGNOSIS — E782 Mixed hyperlipidemia: Secondary | ICD-10-CM | POA: Diagnosis not present

## 2021-03-08 DIAGNOSIS — I129 Hypertensive chronic kidney disease with stage 1 through stage 4 chronic kidney disease, or unspecified chronic kidney disease: Secondary | ICD-10-CM

## 2021-03-08 DIAGNOSIS — Q2112 Patent foramen ovale: Secondary | ICD-10-CM

## 2021-03-08 DIAGNOSIS — I2510 Atherosclerotic heart disease of native coronary artery without angina pectoris: Secondary | ICD-10-CM

## 2021-03-08 DIAGNOSIS — N1831 Chronic kidney disease, stage 3a: Secondary | ICD-10-CM | POA: Diagnosis not present

## 2021-03-08 DIAGNOSIS — Q211 Atrial septal defect: Secondary | ICD-10-CM | POA: Diagnosis not present

## 2021-03-08 MED ORDER — METOPROLOL SUCCINATE ER 25 MG PO TB24: ORAL_TABLET | ORAL | 3 refills | Status: DC

## 2021-03-08 MED ORDER — METOPROLOL SUCCINATE ER 25 MG PO TB24: ORAL_TABLET | ORAL | 4 refills | Status: DC

## 2021-03-08 MED ORDER — OLMESARTAN MEDOXOMIL 20 MG PO TABS: ORAL_TABLET | ORAL | 3 refills | Status: DC

## 2021-03-08 MED ORDER — ATORVASTATIN CALCIUM 40 MG PO TABS: 40.0000 mg | ORAL_TABLET | Freq: Every day | ORAL | 3 refills | Status: DC

## 2021-03-08 NOTE — Patient Instructions (Signed)

## 2022-01-25 ENCOUNTER — Other Ambulatory Visit: Payer: Self-pay | Admitting: Cardiology

## 2022-01-25 DIAGNOSIS — I2510 Atherosclerotic heart disease of native coronary artery without angina pectoris: Secondary | ICD-10-CM

## 2022-01-25 DIAGNOSIS — E782 Mixed hyperlipidemia: Secondary | ICD-10-CM

## 2022-01-25 DIAGNOSIS — Q2112 Patent foramen ovale: Secondary | ICD-10-CM

## 2022-01-25 DIAGNOSIS — I129 Hypertensive chronic kidney disease with stage 1 through stage 4 chronic kidney disease, or unspecified chronic kidney disease: Secondary | ICD-10-CM

## 2022-01-25 DIAGNOSIS — N182 Chronic kidney disease, stage 2 (mild): Secondary | ICD-10-CM

## 2022-05-08 ENCOUNTER — Other Ambulatory Visit: Payer: Self-pay

## 2022-05-08 MED ORDER — OLMESARTAN MEDOXOMIL 20 MG PO TABS: 20.0000 mg | ORAL_TABLET | Freq: Every morning | ORAL | 2 refills | Status: DC

## 2022-08-07 ENCOUNTER — Ambulatory Visit: Payer: 59 | Admitting: Physician Assistant

## 2022-08-07 ENCOUNTER — Encounter: Payer: Self-pay | Admitting: Nurse Practitioner

## 2022-08-07 ENCOUNTER — Telehealth: Payer: Self-pay | Admitting: Physician Assistant

## 2022-08-07 ENCOUNTER — Ambulatory Visit: Payer: 59 | Attending: Nurse Practitioner | Admitting: Nurse Practitioner

## 2022-08-07 VITALS — BP 142/88 | HR 57 | Ht 75.0 in | Wt 226.6 lb

## 2022-08-07 DIAGNOSIS — I200 Unstable angina: Secondary | ICD-10-CM | POA: Diagnosis not present

## 2022-08-07 DIAGNOSIS — I2510 Atherosclerotic heart disease of native coronary artery without angina pectoris: Secondary | ICD-10-CM

## 2022-08-07 DIAGNOSIS — E785 Hyperlipidemia, unspecified: Secondary | ICD-10-CM | POA: Diagnosis not present

## 2022-08-07 LAB — BASIC METABOLIC PANEL
BUN/Creatinine Ratio: 11 (ref 9–20)
BUN: 18 mg/dL (ref 6–24)
CO2: 30 mmol/L — ABNORMAL HIGH (ref 20–29)
Calcium: 10.4 mg/dL — ABNORMAL HIGH (ref 8.7–10.2)
Chloride: 101 mmol/L (ref 96–106)
Creatinine, Ser: 1.63 mg/dL — ABNORMAL HIGH (ref 0.76–1.27)
Glucose: 115 mg/dL — ABNORMAL HIGH (ref 70–99)
Potassium: 4.3 mmol/L (ref 3.5–5.2)
Sodium: 138 mmol/L (ref 134–144)
eGFR: 48 mL/min/{1.73_m2} — ABNORMAL LOW (ref >59)

## 2022-08-07 LAB — CBC
Hematocrit: 45.2 % (ref 37.5–51.0)
Hemoglobin: 15.7 g/dL (ref 13.0–17.7)
MCH: 30.6 pg (ref 26.6–33.0)
MCHC: 34.7 g/dL (ref 31.5–35.7)
MCV: 88 fL (ref 79–97)
Platelets: 244 10*3/uL (ref 150–450)
RBC: 5.13 x10E6/uL (ref 4.14–5.80)
RDW: 14 % (ref 11.6–15.4)
WBC: 5.4 10*3/uL (ref 3.4–10.8)

## 2022-08-07 MED ORDER — NITROGLYCERIN 0.4 MG SL SUBL: 0.4000 mg | SUBLINGUAL_TABLET | SUBLINGUAL | 3 refills | Status: DC | PRN

## 2022-08-07 MED ORDER — EZETIMIBE 10 MG PO TABS: 10.0000 mg | ORAL_TABLET | Freq: Every day | ORAL | 3 refills | Status: DC

## 2022-08-07 NOTE — Progress Notes (Signed)
Cardiology Office Note:    Date:  08/07/2022   ID:  CELVIN PARA, DOB 1961/11/24, MRN [id number]  PCP:  Algis Greenhouse, MD   [facility] Providers Cardiologist:  None     Referring MD: Algis Greenhouse, MD   Chief Complaint: chest pain  History of Present Illness:    RED HOLIDAY is a very pleasant 61 y.o. male with a hx of CAD, HTN, OSA, CKD Stage 3a, and HLD.   Coronary CTA 11/2018 revealed coronary calcium score of 514, 96th percentile for age/sex, moderate stenosis in the mid RCA, proximal PDA, proximal PL, proximal and mid LAD, ostial small caliber ramus intermedius with FFR that did not reveal any hemodynamically significant stenosis.  He was last seen in our office by Dr. Bettina Gavia on 03/08/2021 at which time he was doing well and no changes were made to his treatment plan.  He was advised to return in 1 year for follow-up.  He called our office on 08/07/2022 with concerns for sharp chest pain located in the center of his chest, sometimes radiating to left chest.  States it can last from seconds to an hour and is intermittent, causes a "sick feeling all over my body." He was scheduled for today's visit.   Today, he is here with his wife. He reports frequent episodes of chest pain that occur both at rest and with exertion at times. Symptoms have been present for years. He is frustrated that symptoms continue and he has no clear understanding of why. States on some occasions he gets tingling in his extremities. Feels worse in hot weather. Had difficulty walking a long distance about one week ago. Occasional DOE.  Reports multiple previous test through [facility]. Had coronary CTA 11/2018 that revealed moderate CAD in mid RCA, proximal PDA, proximal PL, proximal and mid LAD.  Additional mild 25 to 49% stenosis in short left main coronary artery. History of CKD, saw nephrology on multiple visits and was eventually told that his elevated creatinine was normal for him. Stopped  atorvastatin in March due to ringing in ears - got better off medication but persisted so he started back on atorvastatin and ringing intensified. Had body aches with rosuvastatin. He denies lower extremity edema, fatigue, palpitations, melena, hematuria, hemoptysis, diaphoresis, weakness, presyncope, syncope, orthopnea, and PND.  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   Benign hypertension 01/19/2016   Chest pain in adult 03/11/2016   Seen at [facility] ED 12/08/15 by DR Maxwell Caul, EKG CXR and Troponin normal, elevated CRP and treated as pericarditis    Stress echo negative for ischemia 2013 at [address]   Chronic kidney disease, stage 3 ([place]) 04/19/2017   GERD (gastroesophageal reflux disease) 06/26/2016   History of closure of ileostomy 07/03/2017   Hyperlipidemia, mixed 06/26/2016   Hypomagnesemia 08/04/2017   2018: 1.7   Hyponatremia 06/26/2017   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   Coenzyme Q10 100 MG capsule Take 100 mg by mouth daily in the afternoon.   ezetimibe (ZETIA) 10 MG tablet Take 1 tablet (10 mg total) by mouth daily.   fluticasone (FLONASE) 50 MCG/ACT nasal spray Place 2 sprays into both nostrils at bedtime.   levothyroxine (SYNTHROID) 75 MCG tablet Take 75 mcg by mouth every morning.   metoprolol succinate (TOPROL-XL) 25 MG 24 hr tablet TAKE 1 TABLET BY MOUTH ONCE DAILY   Multiple Vitamin  (MULTIVITAMIN) tablet Take 1 tablet by mouth daily.   olmesartan (BENICAR) 20 MG tablet Take 1 tablet (20 mg total) by mouth every morning. TAKE 1 TABLET EACH AM FOR HIGH BLOOD PRESSURE   omeprazole (PRILOSEC) 20 MG capsule Take 20 mg by mouth daily.   [DISCONTINUED] nitroGLYCERIN (NITROSTAT) 0.4 MG SL tablet Place 1 tablet (0.4 mg total) under the tongue every 5 (five) minutes as needed for chest pain.     Allergies:   Keflex [cephalexin], Hydrochlorothiazide, and Rosuvastatin   Social History   Socioeconomic History   Marital status: Married    Spouse name: Not on file   Number of children: Not on file   Years of education: Not on file   Highest education level: Not on file  Occupational History   Not on file  Tobacco Use   Smoking status: Never   Smokeless tobacco: Former    Types: Chew    Quit date: 2014  Vaping Use   Vaping Use: Never used  Substance and Sexual Activity   Alcohol use: Not Currently   Drug use: Never   Sexual activity: Not on file  Other Topics Concern   Not on file  Social History Narrative   Not on file   Social Determinants of Health   Financial Resource Strain: Not on file  Food Insecurity: Not on file  Transportation Needs: Not on file  Physical Activity: Not on file  Stress: Not on file  Social Connections: Not on file     Family History: The patient's family history includes Heart attack in his father; Hyperlipidemia in his father; Hypertension in his mother.  ROS:   Please see the history of present illness.    + chest pain All other systems reviewed and are negative.  Labs/Other Studies Reviewed:    The following studies were reviewed today:  CT Chest 05/2019  IMPRESSION: 1. Stable 7 mm right lower lobe pulmonary nodule with 2 mm nodule identified in the right upper lobe on today's exam. Non-contrast chest CT 12 months (from today's scan) is considered optional for low-risk patients, but is recommended for high-risk patients.  This recommendation follows the consensus statement: Guidelines for Management of Incidental Pulmonary Nodules Detected on CT Images: From the Fleischner Society 2017; Radiology 2017; 284:228-243. 2. Hepatic steatosis  CCTA 11/2018  IMPRESSION: 1. Coronary calcium score is 514, which places the patient in the 96th percentile for age and sex matched control.   2. Normal coronary origin with right dominance.   3. Moderate CAD, CADRADS = 3. Moderate stenosis in the mid RCA, proximal PDA, proximal PL, proximal and mid LAD, ostial small caliber ramus intermedius. CT FFR analysis will be performed and reported separately.   4. Patent foramen ovale with left to right shunt demonstrated on cine images.   IMPRESSION: 1. CT FFR analysis didn't show any hemodynamically significant stenosis.  Recent Labs: 08/07/2022: BUN 18; Creatinine, Ser 1.63;  Hemoglobin 15.7; Platelets 244; Potassium 4.3; Sodium 138  Recent Lipid Panel No results found for: "CHOL", "TRIG", "HDL", "CHOLHDL", "VLDL", "LDLCALC", "LDLDIRECT"   Risk Assessment/Calculations:      Physical Exam:    VS:  BP (!) 142/88   Pulse (!) 57   Ht 6\' 3"  (1.905 m)   Wt 226 lb 9.6 oz (102.8 kg)   SpO2 97%   BMI 28.32 kg/m     Wt Readings from Last 3 Encounters:  08/07/22 226 lb 9.6 oz (102.8 kg)  03/08/21 236 lb (107 kg)  12/16/19 231 lb (104.8 kg)     GEN:  Well nourished, well developed in no acute distress HEENT: Normal NECK: No JVD; No carotid bruits CARDIAC: RRR, no murmurs, rubs, gallops RESPIRATORY:  Clear to auscultation without rales, wheezing or rhonchi  ABDOMEN: Soft, non-tender, non-distended MUSCULOSKELETAL:  No edema; No deformity. 2+ pedal pulses, equal bilaterally SKIN: Warm and dry NEUROLOGIC:  Alert and oriented x 3 PSYCHIATRIC:  Normal affect   EKG:  EKG is ordered today.  The ekg ordered today demonstrates sinus bradycardia with LAFB at 57 bpm, no ST abnormality  Did not recheck BP  Diagnoses:     1. Unstable angina (HCC)   2. CAD in native artery   3. Hyperlipidemia LDL goal <70    Assessment and Plan:     CAD with unstable angina: Chest pain concerning for angina.  He had multivessel coronary artery disease on CCTA 11/2018. Reviewed symptoms and plan with Dr. 12/2018, DOD, who agrees that we proceed with cardiac catheterization. Risks reviewed with patient and he agrees to proceed. Will get precath labs today.  Will anticipate that he will need hydration prior to cath, will have him arrive 5 hours prior to procedure time.  We will give further guidance based on results of BMET.  Continue aspirin.   Hyperlipidemia LDL goal < 70: LDL 62 on 10/23/21 while taking atorvastatin.  He has stopped due to side effects.  Advised him to start ezetimibe 10 mg daily and we will refer to Pharm.D lipid clinic to discuss additional treatment options.   Hypertension: BP is elevated today. He is visibly upset. No recent concerns per wife for elevated BP. Will address at upcoming office visit.   Shared Decision Making/Informed Consent The risks [stroke (1 in 1000), death (1 in 1000), kidney failure [usually temporary] (1 in 500), bleeding (1 in 200), allergic reaction [possibly serious] (1 in 200)], benefits (diagnostic support and management of coronary artery disease) and alternatives of a cardiac catheterization were discussed in detail with Mr. Doolen and he is willing to proceed.   Disposition: 3-4 weeks with Dr. Earlene Plater or APP  Medication Adjustments/Labs and Tests Ordered: Current medicines are reviewed at length with the patient today.  Concerns regarding medicines are outlined above.  Orders Placed This Encounter  Procedures   Basic Metabolic Panel (BMET)   CBC   AMB Referral to [facility] Pharm-D   EKG 12-Lead   Meds ordered this encounter  Medications   nitroGLYCERIN (NITROSTAT) 0.4 MG SL tablet    Sig: Place 1 tablet (0.4 mg total) under the tongue every 5 (five) minutes as needed for  chest pain.    Dispense:  25 tablet    Refill:  3   ezetimibe (ZETIA) 10 MG tablet    Sig: Take 1 tablet (10 mg total) by mouth daily.    Dispense:  90 tablet    Refill:  3    Patient Instructions  Medication Instructions:   START Zetia one (1) tablet by mouth ( 10 mg) daily.   *If you need a refill on your cardiac medications before your next appointment, please call your pharmacy*   Lab Work:  TODAY!!!! BMET/CBC  If you have labs (blood work) drawn today and your tests are completely normal, you will receive your results only by: MyChart Message (if you have MyChart) OR A paper copy in the mail If you have any lab test that is abnormal or we need to change your treatment, we will call you to review the results.   Testing/Procedures:   [place] [facility] OF [facility] A DEPT OF [facility] [address] [place], [place] [address] [id number] [place] [address] Dept: [phone] Loc: [phone]  FRAZIER BALFOUR  08/07/2022  You are scheduled for a Cardiac Catheterization on Thursday, August 31 with Dr. Lance Muss.  1. Please arrive at the [facility] (Main Entrance A) at [facility]: [address] [place], [place] [address] at 11:30 AM (This time is two hours before your procedure to ensure your preparation). Free valet parking service is available.   Special note: Every effort is made to have your procedure done on time. Please understand that emergencies sometimes delay scheduled procedures.  2. Diet: Do not eat solid foods after midnight.  The patient may have clear liquids until 5am upon the day of the procedure.  3. Labs: You will need to have blood drawn on Tuesday, August 29 at [facility] at [facility]. [address], [place]  Open: 7:30am - 5pm    Phone: [phone]. You do not need to be fasting.  4. Medication instructions in preparation  for your procedure:   Contrast Allergy: No   On the morning of your procedure, take your Aspirin and any morning medicines NOT listed above.  You may use sips of water.  5. Plan for one night stay--bring personal belongings. 6. Bring a current list of your medications and current insurance cards. 7. You MUST have a responsible person to drive you home. 8. Someone MUST be with you the first 24 hours after you arrive home or your discharge will be delayed. 9. Please wear clothes that are easy to get on and off and wear slip-on shoes.  Thank you for allowing [place] to care for you!   -- [place] [facility]    Follow-Up: At [facility], you and your health needs are our priority.  As part of our continuing mission to provide you with exceptional heart care, we have created designated Provider Care Teams.  These Care Teams include your primary Cardiologist (physician) and Advanced Practice Providers (APPs -  Physician Assistants and Nurse Practitioners) who all work together to provide you with the care you need, when you need it.  We recommend signing up for the patient portal called "MyChart".  Sign up information is provided on this After Visit Summary.  MyChart is used to connect with patients for Virtual Visits (Telemedicine).  Patients are able to view lab/test results, encounter notes, upcoming appointments, etc.  Non-urgent messages can be sent to your provider as well.   To learn more about what you can do with MyChart, go to [email or web address].    Your next appointment:   3 week(s)  The format for your next appointment:   In Person  Provider:   Marcelino Duster  Deryl Ports, NP         Other Instructions  You have been referred to pharmacist to discuss lipids.    Important Information About Sugar         Signed, Emmaline Life, NP  08/07/2022 6:12 PM    [place]

## 2022-08-07 NOTE — Telephone Encounter (Signed)
I spoke with patient and he states his pcp, Dr.Dough had increased his Lipitor to 40 mg and then developed tinnitus so he stopped the statin in March. Symptoms went away somewhat but still has some tinnitus.He has never seen ENT for this.    He has had long history of chest pain to the point he doesn't want to go to the ED anymore because he is embarrassed.   He states it has been 2- 2 1/2 years since he has had symptoms this bad. For the past 2  weeks, he describes sharp CP located in the center of his chest,sometimes radiates to left of chest. It can last seconds to an hour and it comes and goes. He then feels a "sick feeling all over my body" , he has never tried NTG.   He took Lorazepam 0.25 mg last night and was able to sleep.   He states no one has ever told him what is wrong with his heart and he is concerned. He has an appointment today and he will request further testing.

## 2022-08-07 NOTE — Telephone Encounter (Signed)
  Pt c/o of Chest Pain: STAT if CP now or developed within 24 hours  1. Are you having CP right now? No   2. Are you experiencing any other symptoms (ex. SOB, nausea, vomiting, sweating)? nausea  3. How long have you been experiencing CP? Few days  4. Is your CP continuous or coming and going? Coming and going   5. Have you taken Nitroglycerin? No  Pt been feeling chest discomfort with tingling and nausea. He said, it is not too bad that he needs to go to ED. His a pt f Dr. Dulce Sellar and ok to be seen at [facility] office. Made an apt with Dayna Dunn at 3:10 pm  ?

## 2022-08-07 NOTE — Patient Instructions (Signed)
Medication Instructions:   START Zetia one (1) tablet by mouth ( 10 mg) daily.   *If you need a refill on your cardiac medications before your next appointment, please call your pharmacy*   Lab Work:  TODAY!!!! BMET/CBC  If you have labs (blood work) drawn today and your tests are completely normal, you will receive your results only by: MyChart Message (if you have MyChart) OR A paper copy in the mail If you have any lab test that is abnormal or we need to change your treatment, we will call you to review the results.   Testing/Procedures:   [place] [facility] A DE[facility] [address] [place], [place] [address] [id number] [place] [address] Dept: [phone] Loc: [phone]  Lee Stevens  08/07/2022  You are scheduled for a Cardiac Catheterization on Thursday, August 31 with Dr. Lance Muss.  1. Please arrive at the [facility] (Main Entrance A) at [facility]: [address] [place], [place] [address] at 11:30 AM (This time is two hours before your procedure to ensure your preparation). Free valet parking service is available.   Special note: Every effort is made to have your procedure done on time. Please understand that emergencies sometimes delay scheduled procedures.  2. Diet: Do not eat solid foods after midnight.  The patient may have clear liquids until 5am upon the day of the procedure.  3. Labs: You will need to have blood drawn on Tuesday, August 29 at [facility] at [facility]. [address], [place]  Open: 7:30am - 5pm    Phone: [phone]. You do not need to be fasting.  4. Medication instructions in preparation for your procedure:   Contrast Allergy: No   On the morning of your procedure, take your Aspirin and any morning medicines NOT listed above.  You may use sips of water.  5. Plan for one night  stay--bring personal belongings. 6. Bring a current list of your medications and current insurance cards. 7. You MUST have a responsible person to drive you home. 8. Someone MUST be with you the first 24 hours after you arrive home or your discharge will be delayed. 9. Please wear clothes that are easy to get on and off and wear slip-on shoes.  Thank you for allowing [place] to care for you!   -- [place] [facility] services    Follow-Up: At [facility], you and your health needs are our priority.  As part of our continuing mission to provide you with exceptional heart care, we have created designated Provider Care Teams.  These Care Teams include your primary Cardiologist (physician) and Advanced Practice Providers (APPs -  Physician Assistants and Nurse Practitioners) who all work together to provide you with the care you need, when you need it.  We recommend signing up for the patient portal called "MyChart".  Sign up information is provided on this After Visit Summary.  MyChart is used to connect with patients for Virtual Visits (Telemedicine).  Patients are able to view lab/test results, encounter notes, upcoming appointments, etc.  Non-urgent messages can be sent to your provider as well.   To learn more about what you can do with MyChart, go to [email or web address].    Your next appointment:   3 week(s)  The format for your next appointment:   In Person  Provider:   Marcelino Duster  [place], NP         Other Instructions  You have been referred to pharmacist to discuss lipids.    Important Information About Sugar

## 2022-08-07 NOTE — H&P (View-Only) (Signed)
Cardiology Office Note:    Date:  08/07/2022   ID:  Lee Stevens, DOB 01/11/1961, MRN [id number]  PCP:  Algis Greenhouse, MD   [facility] Providers Cardiologist:  None     Referring MD: Algis Greenhouse, MD   Chief Complaint: chest pain  History of Present Illness:    Lee Stevens is a very pleasant 61 y.o. male with a hx of CAD, HTN, OSA, CKD Stage 3a, and HLD.   Coronary CTA 11/2018 revealed coronary calcium score of 514, 96th percentile for age/sex, moderate stenosis in the mid RCA, proximal PDA, proximal PL, proximal and mid LAD, ostial small caliber ramus intermedius with FFR that did not reveal any hemodynamically significant stenosis.  He was last seen in our office by Dr. Bettina Gavia on 03/08/2021 at which time he was doing well and no changes were made to his treatment plan.  He was advised to return in 1 year for follow-up.  He called our office on 08/07/2022 with concerns for sharp chest pain located in the center of his chest, sometimes radiating to left chest.  States it can last from seconds to an hour and is intermittent, causes a "sick feeling all over my body." He was scheduled for today's visit.   Today, he is here with his wife. He reports frequent episodes of chest pain that occur both at rest and with exertion at times. Symptoms have been present for years. He is frustrated that symptoms continue and he has no clear understanding of why. States on some occasions he gets tingling in his extremities. Feels worse in hot weather. Had difficulty walking a long distance about one week ago. Occasional DOE.  Reports multiple previous test through [facility]. Had coronary CTA 11/2018 that revealed moderate CAD in mid RCA, proximal PDA, proximal PL, proximal and mid LAD.  Additional mild 25 to 49% stenosis in short left main coronary artery. History of CKD, saw nephrology on multiple visits and was eventually told that his elevated creatinine was normal for him. Stopped  atorvastatin in March due to ringing in ears - got better off medication but persisted so he started back on atorvastatin and ringing intensified. Had body aches with rosuvastatin. He denies lower extremity edema, fatigue, palpitations, melena, hematuria, hemoptysis, diaphoresis, weakness, presyncope, syncope, orthopnea, and PND.  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   Benign hypertension 01/19/2016   Chest pain in adult 03/11/2016   Seen at [facility] 12/08/15 by DR Maxwell Caul, EKG CXR and Troponin normal, elevated CRP and treated as pericarditis    Stress echo negative for ischemia 2013 at [address]   Chronic kidney disease, stage 3 ([place]) 04/19/2017   GERD (gastroesophageal reflux disease) 06/26/2016   History of closure of ileostomy 07/03/2017   Hyperlipidemia, mixed 06/26/2016   Hypomagnesemia 08/04/2017   2018: 1.7   Hyponatremia 06/26/2017   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   Coenzyme Q10 100 MG capsule Take 100 mg by mouth daily in the afternoon.   ezetimibe (ZETIA) 10 MG tablet Take 1 tablet (10 mg total) by mouth daily.   fluticasone (FLONASE) 50 MCG/ACT nasal spray Place 2 sprays into both nostrils at bedtime.   levothyroxine (SYNTHROID) 75 MCG tablet Take 75 mcg by mouth every morning.   metoprolol succinate (TOPROL-XL) 25 MG 24 hr tablet TAKE 1 TABLET BY MOUTH ONCE DAILY   Multiple Vitamin  (MULTIVITAMIN) tablet Take 1 tablet by mouth daily.   olmesartan (BENICAR) 20 MG tablet Take 1 tablet (20 mg total) by mouth every morning. TAKE 1 TABLET EACH AM FOR HIGH BLOOD PRESSURE   omeprazole (PRILOSEC) 20 MG capsule Take 20 mg by mouth daily.   [DISCONTINUED] nitroGLYCERIN (NITROSTAT) 0.4 MG SL tablet Place 1 tablet (0.4 mg total) under the tongue every 5 (five) minutes as needed for chest pain.     Allergies:   Keflex [cephalexin], Hydrochlorothiazide, and Rosuvastatin   Social History   Socioeconomic History   Marital status: Married    Spouse name: Not on file   Number of children: Not on file   Years of education: Not on file   Highest education level: Not on file  Occupational History   Not on file  Tobacco Use   Smoking status: Never   Smokeless tobacco: Former    Types: Chew    Quit date: 2014  Vaping Use   Vaping Use: Never used  Substance and Sexual Activity   Alcohol use: Not Currently   Drug use: Never   Sexual activity: Not on file  Other Topics Concern   Not on file  Social History Narrative   Not on file   Social Determinants of Health   Financial Resource Strain: Not on file  Food Insecurity: Not on file  Transportation Needs: Not on file  Physical Activity: Not on file  Stress: Not on file  Social Connections: Not on file     Family History: The patient's family history includes Heart attack in his father; Hyperlipidemia in his father; Hypertension in his mother.  ROS:   Please see the history of present illness.    + chest pain All other systems reviewed and are negative.  Labs/Other Studies Reviewed:    The following studies were reviewed today:  CT Chest 05/2019  IMPRESSION: 1. Stable 7 mm right lower lobe pulmonary nodule with 2 mm nodule identified in the right upper lobe on today's exam. Non-contrast chest CT 12 months (from today's scan) is considered optional for low-risk patients, but is recommended for high-risk patients.  This recommendation follows the consensus statement: Guidelines for Management of Incidental Pulmonary Nodules Detected on CT Images: From the Fleischner Society 2017; Radiology 2017; 284:228-243. 2. Hepatic steatosis  CCTA 11/2018  IMPRESSION: 1. Coronary calcium score is 514, which places the patient in the 96th percentile for age and sex matched control.   2. Normal coronary origin with right dominance.   3. Moderate CAD, CADRADS = 3. Moderate stenosis in the mid RCA, proximal PDA, proximal PL, proximal and mid LAD, ostial small caliber ramus intermedius. CT FFR analysis will be performed and reported separately.   4. Patent foramen ovale with left to right shunt demonstrated on cine images.   IMPRESSION: 1. CT FFR analysis didn't show any hemodynamically significant stenosis.  Recent Labs: 08/07/2022: BUN 18; Creatinine, Ser 1.63;  Hemoglobin 15.7; Platelets 244; Potassium 4.3; Sodium 138  Recent Lipid Panel No results found for: "CHOL", "TRIG", "HDL", "CHOLHDL", "VLDL", "LDLCALC", "LDLDIRECT"   Risk Assessment/Calculations:      Physical Exam:    VS:  BP (!) 142/88   Pulse (!) 57   Ht 6\' 3"  (1.905 m)   Wt 226 lb 9.6 oz (102.8 kg)   SpO2 97%   BMI 28.32 kg/m     Wt Readings from Last 3 Encounters:  08/07/22 226 lb 9.6 oz (102.8 kg)  03/08/21 236 lb (107 kg)  12/16/19 231 lb (104.8 kg)     GEN:  Well nourished, well developed in no acute distress HEENT: Normal NECK: No JVD; No carotid bruits CARDIAC: RRR, no murmurs, rubs, gallops RESPIRATORY:  Clear to auscultation without rales, wheezing or rhonchi  ABDOMEN: Soft, non-tender, non-distended MUSCULOSKELETAL:  No edema; No deformity. 2+ pedal pulses, equal bilaterally SKIN: Warm and dry NEUROLOGIC:  Alert and oriented x 3 PSYCHIATRIC:  Normal affect   EKG:  EKG is ordered today.  The ekg ordered today demonstrates sinus bradycardia with LAFB at 57 bpm, no ST abnormality  Did not recheck BP  Diagnoses:     1. Unstable angina (HCC)   2. CAD in native artery   3. Hyperlipidemia LDL goal <70    Assessment and Plan:     CAD with unstable angina: Chest pain concerning for angina.  He had multivessel coronary artery disease on CCTA 11/2018. Reviewed symptoms and plan with Dr. 12/2018, DOD, who agrees that we proceed with cardiac catheterization. Risks reviewed with patient and he agrees to proceed. Will get precath labs today.  Will anticipate that he will need hydration prior to cath, will have him arrive 5 hours prior to procedure time.  We will give further guidance based on results of BMET.  Continue aspirin.   Hyperlipidemia LDL goal < 70: LDL 62 on 10/23/21 while taking atorvastatin.  He has stopped due to side effects.  Advised him to start ezetimibe 10 mg daily and we will refer to Pharm.D lipid clinic to discuss additional treatment options.   Hypertension: BP is elevated today. He is visibly upset. No recent concerns per wife for elevated BP. Will address at upcoming office visit.   Shared Decision Making/Informed Consent The risks [stroke (1 in 1000), death (1 in 1000), kidney failure [usually temporary] (1 in 500), bleeding (1 in 200), allergic reaction [possibly serious] (1 in 200)], benefits (diagnostic support and management of coronary artery disease) and alternatives of a cardiac catheterization were discussed in detail with Mr. Doolen and he is willing to proceed.   Disposition: 3-4 weeks with Dr. Earlene Plater or APP  Medication Adjustments/Labs and Tests Ordered: Current medicines are reviewed at length with the patient today.  Concerns regarding medicines are outlined above.  Orders Placed This Encounter  Procedures   Basic Metabolic Panel (BMET)   CBC   AMB Referral to [facility] Pharm-D   EKG 12-Lead   Meds ordered this encounter  Medications   nitroGLYCERIN (NITROSTAT) 0.4 MG SL tablet    Sig: Place 1 tablet (0.4 mg total) under the tongue every 5 (five) minutes as needed for  chest pain.    Dispense:  25 tablet    Refill:  3   ezetimibe (ZETIA) 10 MG tablet    Sig: Take 1 tablet (10 mg total) by mouth daily.    Dispense:  90 tablet    Refill:  3    Patient Instructions  Medication Instructions:   START Zetia one (1) tablet by mouth ( 10 mg) daily.   *If you need a refill on your cardiac medications before your next appointment, please call your pharmacy*   Lab Work:  TODAY!!!! BMET/CBC  If you have labs (blood work) drawn today and your tests are completely normal, you will receive your results only by: MyChart Message (if you have MyChart) OR A paper copy in the mail If you have any lab test that is abnormal or we need to change your treatment, we will call you to review the results.   Testing/Procedures:   [place] A DEPT OF [facility] A DEPT OF [facility] [address] [place], [place] [id number] [place] [address] Dept: [phone] Loc: [phone]  FRAZIER BALFOUR  08/07/2022  You are scheduled for a Cardiac Catheterization on Thursday, August 31 with Dr. Lance Muss.  1. Please arrive at the [facility] (Main Entrance A) at [facility]: [address] [place], [place] [address] at 11:30 AM (This time is two hours before your procedure to ensure your preparation). Free valet parking service is available.   Special note: Every effort is made to have your procedure done on time. Please understand that emergencies sometimes delay scheduled procedures.  2. Diet: Do not eat solid foods after midnight.  The patient may have clear liquids until 5am upon the day of the procedure.  3. Labs: You will need to have blood drawn on Tuesday, August 29 at [facility] at [facility]. [address], [place]  Open: 7:30am - 5pm    Phone: [phone]. You do not need to be fasting.  4. Medication instructions in preparation  for your procedure:   Contrast Allergy: No   On the morning of your procedure, take your Aspirin and any morning medicines NOT listed above.  You may use sips of water.  5. Plan for one night stay--bring personal belongings. 6. Bring a current list of your medications and current insurance cards. 7. You MUST have a responsible person to drive you home. 8. Someone MUST be with you the first 24 hours after you arrive home or your discharge will be delayed. 9. Please wear clothes that are easy to get on and off and wear slip-on shoes.  Thank you for allowing [place] to care for you!   -- [facility]    Follow-Up: At [facility], you and your health needs are our priority.  As part of our continuing mission to provide you with exceptional heart care, we have created designated Provider Care Teams.  These Care Teams include your primary Cardiologist (physician) and Advanced Practice Providers (APPs -  Physician Assistants and Nurse Practitioners) who all work together to provide you with the care you need, when you need it.  We recommend signing up for the patient portal called "MyChart".  Sign up information is provided on this After Visit Summary.  MyChart is used to connect with patients for Virtual Visits (Telemedicine).  Patients are able to view lab/test results, encounter notes, upcoming appointments, etc.  Non-urgent messages can be sent to your provider as well.   To learn more about what you can do with MyChart, go to [email or web address].    Your next appointment:   3 week(s)  The format for your next appointment:   In Person  Provider:   Marcelino Duster  Teryl Mcconaghy, NP         Other Instructions  You have been referred to pharmacist to discuss lipids.    Important Information About Sugar         Signed, Emmaline Life, NP  08/07/2022 6:12 PM    [place]

## 2022-08-08 ENCOUNTER — Telehealth: Payer: Self-pay | Admitting: *Deleted

## 2022-08-08 NOTE — Telephone Encounter (Signed)
Cardiac Catheterization scheduled at [facility] for: Thursday August 09, 2022 1:30 PM Arrival time and place: [facility] Main Entrance A at: 8:30 AM-pre-procedure hydration  Nothing to eat after midnight prior to procedure, clear liquids until 5 AM day of procedure.  Medication instructions: -Hold:  Olmesartan-day before and day of procedure-per protocol GFR 48 (pt had already taken today) -Except hold medications usual morning medications can be taken with sips of water including aspirin 81 mg.  Confirmed patient has responsible adult to drive home post procedure and be with patient first 24 hours after arriving home.  Patient reports no new symptoms concerning for COVID-19 in the past 10 days.  Reviewed procedure instructions with patient.

## 2022-08-08 NOTE — Addendum Note (Signed)
Addended by: Levi Aland on: 08/08/2022 12:10 PM   Modules accepted: Orders

## 2022-08-09 ENCOUNTER — Encounter (HOSPITAL_COMMUNITY)
Admission: RE | Disposition: A | Discharge status: Home / Self Care | Payer: Self-pay | Source: Home / Self Care | Attending: Interventional Cardiology

## 2022-08-09 ENCOUNTER — Encounter (HOSPITAL_COMMUNITY): Payer: Self-pay | Admitting: Interventional Cardiology

## 2022-08-09 ENCOUNTER — Ambulatory Visit (HOSPITAL_COMMUNITY)
Admission: RE | Admit: 2022-08-09 | Discharge: 2022-08-09 | Disposition: A | Discharge status: Home / Self Care | Payer: 59 | Attending: Interventional Cardiology | Admitting: Interventional Cardiology

## 2022-08-09 DIAGNOSIS — E785 Hyperlipidemia, unspecified: Secondary | ICD-10-CM | POA: Insufficient documentation

## 2022-08-09 DIAGNOSIS — I2510 Atherosclerotic heart disease of native coronary artery without angina pectoris: Secondary | ICD-10-CM | POA: Diagnosis not present

## 2022-08-09 DIAGNOSIS — Z79899 Other long term (current) drug therapy: Secondary | ICD-10-CM | POA: Diagnosis not present

## 2022-08-09 DIAGNOSIS — I25110 Atherosclerotic heart disease of native coronary artery with unstable angina pectoris: Secondary | ICD-10-CM | POA: Diagnosis present

## 2022-08-09 DIAGNOSIS — G4733 Obstructive sleep apnea (adult) (pediatric): Secondary | ICD-10-CM | POA: Diagnosis not present

## 2022-08-09 DIAGNOSIS — N1831 Chronic kidney disease, stage 3a: Secondary | ICD-10-CM | POA: Insufficient documentation

## 2022-08-09 DIAGNOSIS — I200 Unstable angina: Secondary | ICD-10-CM

## 2022-08-09 DIAGNOSIS — I129 Hypertensive chronic kidney disease with stage 1 through stage 4 chronic kidney disease, or unspecified chronic kidney disease: Secondary | ICD-10-CM | POA: Diagnosis not present

## 2022-08-09 HISTORY — PX: LEFT HEART CATH AND CORONARY ANGIOGRAPHY: CATH118249

## 2022-08-09 SURGERY — LEFT HEART CATH AND CORONARY ANGIOGRAPHY
Anesthesia: LOCAL

## 2022-08-09 MED ORDER — HEPARIN SODIUM (PORCINE) 1000 UNIT/ML IJ SOLN
INTRAMUSCULAR | Status: DC | PRN
  Administered 2022-08-09: 5000 [IU] via INTRAVENOUS

## 2022-08-09 MED ORDER — VERAPAMIL HCL 2.5 MG/ML IV SOLN
INTRAVENOUS | Status: DC | PRN
  Administered 2022-08-09 (×2): 10 mL via INTRA_ARTERIAL

## 2022-08-09 MED ORDER — LIDOCAINE HCL (PF) 1 % IJ SOLN
INTRAMUSCULAR | Status: AC
Start: 2022-08-09 — End: ?
  Filled 2022-08-09: qty 30

## 2022-08-09 MED ORDER — LIDOCAINE HCL (PF) 1 % IJ SOLN
INTRAMUSCULAR | Status: DC | PRN
  Administered 2022-08-09: 2 mL

## 2022-08-09 MED ORDER — MIDAZOLAM HCL 2 MG/2ML IJ SOLN
INTRAMUSCULAR | Status: AC
  Filled 2022-08-09: qty 2

## 2022-08-09 MED ORDER — HEPARIN (PORCINE) IN NACL 1000-0.9 UT/500ML-% IV SOLN
INTRAVENOUS | Status: AC
Start: 2022-08-09 — End: ?
  Filled 2022-08-09: qty 1000

## 2022-08-09 MED ORDER — SODIUM CHLORIDE 0.9% FLUSH: 3.0000 mL | INTRAVENOUS | Status: DC | PRN

## 2022-08-09 MED ORDER — HEPARIN SODIUM (PORCINE) 1000 UNIT/ML IJ SOLN
INTRAMUSCULAR | Status: AC
  Filled 2022-08-09: qty 10

## 2022-08-09 MED ORDER — HYDRALAZINE HCL 20 MG/ML IJ SOLN: 10.0000 mg | INTRAMUSCULAR | Status: DC | PRN

## 2022-08-09 MED ORDER — SODIUM CHLORIDE 0.9% FLUSH: 3.0000 mL | Freq: Two times a day (BID) | INTRAVENOUS | Status: DC

## 2022-08-09 MED ORDER — IOHEXOL 350 MG/ML SOLN
INTRAVENOUS | Status: DC | PRN
  Administered 2022-08-09: 50 mL

## 2022-08-09 MED ORDER — HEPARIN (PORCINE) IN NACL 1000-0.9 UT/500ML-% IV SOLN
INTRAVENOUS | Status: DC | PRN
  Administered 2022-08-09 (×2): 500 mL

## 2022-08-09 MED ORDER — ACETAMINOPHEN 325 MG PO TABS
650.0000 mg | ORAL_TABLET | ORAL | Status: DC | PRN
  Administered 2022-08-09: 650 mg via ORAL
  Filled 2022-08-09: qty 2

## 2022-08-09 MED ORDER — SODIUM CHLORIDE 0.9 % IV SOLN: 250.0000 mL | INTRAVENOUS | Status: DC | PRN

## 2022-08-09 MED ORDER — FENTANYL CITRATE (PF) 100 MCG/2ML IJ SOLN
INTRAMUSCULAR | Status: AC
  Filled 2022-08-09: qty 2

## 2022-08-09 MED ORDER — SODIUM CHLORIDE 0.9 % IV SOLN: INTRAVENOUS | Status: DC

## 2022-08-09 MED ORDER — MIDAZOLAM HCL 2 MG/2ML IJ SOLN
INTRAMUSCULAR | Status: DC | PRN
  Administered 2022-08-09: 1 mg via INTRAVENOUS
  Administered 2022-08-09: 2 mg via INTRAVENOUS

## 2022-08-09 MED ORDER — LABETALOL HCL 5 MG/ML IV SOLN: 10.0000 mg | INTRAVENOUS | Status: DC | PRN

## 2022-08-09 MED ORDER — SODIUM CHLORIDE 0.9 % IV SOLN: INTRAVENOUS | Status: AC

## 2022-08-09 MED ORDER — VERAPAMIL HCL 2.5 MG/ML IV SOLN
INTRAVENOUS | Status: AC
Start: 2022-08-09 — End: ?
  Filled 2022-08-09: qty 2

## 2022-08-09 MED ORDER — ONDANSETRON HCL 4 MG/2ML IJ SOLN: 4.0000 mg | Freq: Four times a day (QID) | INTRAMUSCULAR | Status: DC | PRN

## 2022-08-09 MED ORDER — ASPIRIN 81 MG PO CHEW: 81.0000 mg | CHEWABLE_TABLET | ORAL | Status: DC

## 2022-08-09 MED ORDER — FENTANYL CITRATE (PF) 100 MCG/2ML IJ SOLN
INTRAMUSCULAR | Status: DC | PRN
  Administered 2022-08-09 (×3): 25 ug via INTRAVENOUS

## 2022-08-09 SURGICAL SUPPLY — 10 items
BAND CMPR LRG ZPHR (HEMOSTASIS) ×1
BAND ZEPHYR COMPRESS 30 LONG (HEMOSTASIS) IMPLANT
CATH 5FR JL3.5 JR4 ANG PIG MP (CATHETERS) IMPLANT
GLIDESHEATH SLEND SS 6F .021 (SHEATH) IMPLANT
GUIDEWIRE INQWIRE 1.5J.035X260 (WIRE) IMPLANT
INQWIRE 1.5J .035X260CM (WIRE) ×1
KIT HEART LEFT (KITS) ×1 IMPLANT
PACK CARDIAC CATHETERIZATION (CUSTOM PROCEDURE TRAY) ×1 IMPLANT
TRANSDUCER W/STOPCOCK (MISCELLANEOUS) ×1 IMPLANT
TUBING CIL FLEX 10 FLL-RA (TUBING) ×1 IMPLANT

## 2022-08-09 NOTE — Progress Notes (Addendum)
12:45 increase in bleeding from the right radial site. No air removed 13:30 Band removed redressed with gauze and transparent dressing. Wrist splint applied with co band

## 2022-08-09 NOTE — Interval H&P Note (Signed)
Cath Lab Visit (complete for each Cath Lab visit)  Clinical Evaluation Leading to the Procedure:   ACS: No.  Non-ACS:    Anginal Classification: CCS III  Anti-ischemic medical therapy: Minimal Therapy (1 class of medications)  Non-Invasive Test Results: No non-invasive testing performed  Prior CABG: No previous CABG      History and Physical Interval Note:  08/09/2022 10:58 AM  Lee Stevens  has presented today for surgery, with the diagnosis of angina.  The various methods of treatment have been discussed with the patient and family. After consideration of risks, benefits and other options for treatment, the patient has consented to  Procedure(s): LEFT HEART CATH AND CORONARY ANGIOGRAPHY (N/A) as a surgical intervention.  The patient's history has been reviewed, patient examined, no change in status, stable for surgery.  I have reviewed the patient's chart and labs.  Questions were answered to the patient's satisfaction.     Lance Muss

## 2022-08-09 NOTE — Discharge Instructions (Signed)

## 2022-08-26 NOTE — Progress Notes (Unsigned)
Cardiology Office Note:    Date:  08/29/2022   ID:  Lee Stevens, DOB 12-19-1960, MRN [id number]  PCP:  Olive Bass, MD   [facility] Providers Cardiologist:  None     Referring MD: Olive Bass, MD   Chief Complaint: chest pain  History of Present Illness:    Lee Stevens is a very pleasant 61 y.o. male with a hx of CAD, HTN, OSA, CKD Stage 3a, and HLD.   Coronary CTA 11/2018 revealed coronary calcium score of 514, 96th percentile for age/sex, moderate stenosis in the mid RCA, proximal PDA, proximal PL, proximal and mid LAD, ostial small caliber ramus intermedius with FFR that did not reveal any hemodynamically significant stenosis.  He was last seen in our office by Dr. Dulce Sellar on 03/08/2021 at which time he was doing well and no changes were made to his treatment plan.  He was advised to return in 1 year for follow-up.  He called our office on 08/07/2022 with concerns for sharp chest pain located in the center of his chest, sometimes radiating to left chest. States it can last from seconds to an hour and is intermittent, causes a "sick feeling all over my body." He was scheduled for today's visit.   Seen by me on 08/07/22 with cc of frequent episodes of chest pain that occur both at rest and with exertion at times. Symptoms have been present for years. He is frustrated that symptoms continue and he has no clear understanding of why. States on some occasions he gets tingling in his extremities. Feels worse in hot weather. Had difficulty walking a long distance about one week ago. Occasional DOE.  Reports multiple previous test through [facility]. Had coronary CTA 11/2018 that revealed moderate CAD in mid RCA, proximal PDA, proximal PL, proximal and mid LAD.  Additional mild 25 to 49% stenosis in short left main coronary artery. History of CKD, saw nephrology on multiple visits and was eventually told that his elevated creatinine was normal for him. Stopped atorvastatin in  March due to ringing in ears - got better off medication but persisted so he started back on atorvastatin and ringing intensified. Had body aches with rosuvastatin. He denied lower extremity edema, fatigue, palpitations, melena, hematuria, hemoptysis, diaphoresis, weakness, presyncope, syncope, orthopnea, and PND. Due to symptoms concerning for angina, he underwent cardiac cath 08/09/22 which revealed mild nonobstructive coronary artery disease.  Very large dominant right coronary artery which feeds the apex.  LAD and its branches are generally small and diffusely diseased with mLAD 40% stenosis, normal LVEDP, no aortic valve stenosis.  Today, he is here with his wife. Reports he continues to have intermittent chest pain, at times pain is dull and persistent, and then on occasion will feel sharp pain. Symptoms may last for a month, then go away for 6 months. Symptoms seem to be worse with exertion but also occur when resting. Accompanied by nausea and lightheadedness. No pain currently. Having tinnitus thought 2/2 to statin but has persisted despite being off statin. 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 Would like to retry statin therapy and is planning to schedule an appointment with ENT. He denies shortness of breath, lower extremity edema, fatigue, palpitations, diaphoresis, weakness, presyncope, syncope, orthopnea, and PND.  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  Benign hypertension 01/19/2016   Chest pain in adult 03/11/2016   Seen at [facility] ED 12/08/15 by DR Maxwell Caul, EKG CXR and Troponin normal, elevated CRP and treated as pericarditis    Stress echo negative for ischemia 2013 at [address]   Chronic kidney disease, stage 3 ([place]) 04/19/2017   GERD (gastroesophageal reflux disease) 06/26/2016   History of closure of ileostomy  07/03/2017   Hyperlipidemia, mixed 06/26/2016   Hypomagnesemia 08/04/2017   2018: 1.7   Hyponatremia 06/26/2017   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   Obstructive sleep apnea 06/26/2016   Prediabetes 07/02/2016   2018: 121/5.6   SBO (small bowel obstruction) ([place]) 06/24/2017   Subclinical hypothyroidism 06/26/2016    Past Surgical History:  Procedure Laterality Date   APPENDECTOMY     CARDIAC CATHETERIZATION     CHOLECYSTECTOMY     ILEOSTOMY     LEFT HEART CATH AND CORONARY ANGIOGRAPHY N/A 08/09/2022   Procedure: LEFT HEART CATH AND CORONARY ANGIOGRAPHY;  Surgeon: Jettie Booze, MD;  Location: [facility];  Service: Cardiovascular;  Laterality: N/A;   TONSILLECTOMY AND ADENOIDECTOMY     TOTAL HIP ARTHROPLASTY      Current Medications: Current Meds  Medication Sig   aspirin EC 81 MG tablet Take 81 mg by mouth daily.   Coenzyme Q10 100 MG capsule Take 100 mg by mouth daily in the afternoon.   ezetimibe (ZETIA) 10 MG tablet Take 1 tablet (10 mg total) by mouth daily.   fluticasone (FLONASE) 50 MCG/ACT nasal spray Place 2 sprays into both nostrils at bedtime.   levothyroxine (SYNTHROID) 75 MCG tablet Take 75 mcg by mouth every morning.   metoprolol succinate (TOPROL-XL) 25 MG 24 hr tablet TAKE 1 TABLET BY MOUTH ONCE DAILY   Multiple Vitamin (MULTIVITAMIN) tablet Take 1 tablet by mouth daily.   nitroGLYCERIN (NITROSTAT) 0.4 MG SL tablet Place 1 tablet (0.4 mg total) under the tongue every 5 (five) minutes as needed for chest pain.   olmesartan (BENICAR) 20 MG tablet Take 1 tablet (20 mg total) by mouth every morning. TAKE 1 TABLET EACH AM FOR HIGH BLOOD PRESSURE   omeprazole (PRILOSEC) 20 MG capsule Take 20 mg by mouth daily.   ranolazine (RANEXA) 500 MG 12 hr tablet Take 1 tablet (500 mg total) by mouth 2 (two) times daily.     Allergies:   Keflex [cephalexin], Hydrochlorothiazide, and Rosuvastatin   Social History   Socioeconomic History   Marital status: Married     Spouse name: Not on file   Number of children: Not on file   Years of education: Not on file   Highest education level: Not on file  Occupational History   Not on file  Tobacco Use   Smoking status: Never   Smokeless tobacco: Former    Types: Chew    Quit date: 2014  Vaping Use   Vaping Use: Never used  Substance and Sexual Activity   Alcohol use: Not Currently   Drug use: Never   Sexual activity: Not on file  Other Topics Concern   Not on file  Social History Narrative   Not on file   Social Determinants of Health   Financial Resource Strain: Not on file  Food Insecurity: Not on file  Transportation Needs: Not on file  Physical Activity: Not on file  Stress: Not on file  Social Connections: Not on file     Family History: The patient's family history includes Heart attack in his father; Hyperlipidemia  in his father; Hypertension in his mother.  ROS:   Please see the history of present illness.    + chest pain All other systems reviewed and are negative.  Labs/Other Studies Reviewed:    The following studies were reviewed today:  LHC 08/09/22    Mid LAD lesion is 40% stenosed.   LV end diastolic pressure is mildly elevated.  LVEDP 20 mm Hg.   There is no aortic valve stenosis.   Mild, nonobstructive coronary artery disease.  Very large, dominant right coronary artery which feeds the apex.  LAD and its branches are generally small and diffusely diseased.  Continue medical therapy.  We spoke about the importance of healthy diet, regular exercise.  CT Chest 05/2019  IMPRESSION: 1. Stable 7 mm right lower lobe pulmonary nodule with 2 mm nodule identified in the right upper lobe on today's exam. Non-contrast chest CT 12 months (from today's scan) is considered optional for low-risk patients, but is recommended for high-risk patients. This recommendation follows the consensus statement: Guidelines for Management of Incidental Pulmonary Nodules Detected on CT  Images: From the Fleischner Society 2017; Radiology 2017; 284:228-243. 2. Hepatic steatosis  CCTA 11/2018  IMPRESSION: 1. Coronary calcium score is 514, which places the patient in the 96th percentile for age and sex matched control.   2. Normal coronary origin with right dominance.   3. Moderate CAD, CADRADS = 3. Moderate stenosis in the mid RCA, proximal PDA, proximal PL, proximal and mid LAD, ostial small caliber ramus intermedius. CT FFR analysis will be performed and reported separately.   4. Patent foramen ovale with left to right shunt demonstrated on cine images.   IMPRESSION: 1. CT FFR analysis didn't show any hemodynamically significant stenosis.  Recent Labs: 08/07/2022: BUN 18; Creatinine, Ser 1.63; Hemoglobin 15.7; Platelets 244; Potassium 4.3; Sodium 138  Recent Lipid Panel No results found for: "CHOL", "TRIG", "HDL", "CHOLHDL", "VLDL", "LDLCALC", "LDLDIRECT"   Risk Assessment/Calculations:      Physical Exam:    VS:  BP 124/72   Pulse 61   Ht 6\' 3"  (1.905 m)   Wt 225 lb (102.1 kg)   SpO2 95%   BMI 28.12 kg/m     Wt Readings from Last 3 Encounters:  08/29/22 225 lb (102.1 kg)  08/09/22 225 lb (102.1 kg)  08/07/22 226 lb 9.6 oz (102.8 kg)     GEN:  Well nourished, well developed in no acute distress HEENT: Normal NECK: No JVD; No carotid bruits CARDIAC: RRR, no murmurs, rubs, gallops RESPIRATORY:  Clear to auscultation without rales, wheezing or rhonchi  ABDOMEN: Soft, non-tender, non-distended MUSCULOSKELETAL:  No edema; No deformity. 2+ pedal pulses, equal bilaterally SKIN: Warm and dry NEUROLOGIC:  Alert and oriented x 3 PSYCHIATRIC:  Normal affect   EKG:  EKG is not ordered today  Diagnoses:    1. Coronary artery disease of bypass graft of native heart with stable angina pectoris ([place])   2. Hyperlipidemia LDL goal <70   3. Essential hypertension     Assessment and Plan:     CAD with angina: Mild nonobstructive CAD on cath  08/09/22.  Continues to have intermittent symptoms of chest pain at times associated with lightheadedness and nausea. Symptoms felt to be 2/2 diffuse disease in small coronary arteries. We will try Ranexa 500 mg twice daily.  Advised him to call back if cost prohibitive. Would favor low dose amlodipine or isosorbide if unable to continue Ranexa. We will get fasting labs for review prior to  Lipid Clinic appointment scheduled for 10/3. Emphasized the importance of whole food mostly plant-based diet, 150 minutes of moderate intensity exercise each week, and weight loss. Provided information on healthy lifestyle management and Mediterranean diet. Continue aspirin, ezetimibe, metoprolol, olmesartan.   Hyperlipidemia LDL goal < 70: LDL 62 on 10/23/21 while taking atorvastatin. He is agreeable to retry statin therapy. Tolerating ezetimibe 10 mg daily without side effects. Will get lipoprotein (a) and NMR for further risk stratification and guidelines for therapy.  He will go to our [place] office tomorrow for fasting lab work.  Hypertension: BP is well-controlled. Has occasional lightheadedness with position changes. Drinks more sweet tea than water, encouraged him to decrease sugar intake and increase hydration with water. Continue olmesartan, metoprolol.  Disposition: 6 months with Dr. Bettina Gavia  Medication Adjustments/Labs and Tests Ordered: Current medicines are reviewed at length with the patient today.  Concerns regarding medicines are outlined above.  Orders Placed This Encounter  Procedures   NMR, lipoprofile   Lipoprotein A (LPA)   Meds ordered this encounter  Medications   ranolazine (RANEXA) 500 MG 12 hr tablet    Sig: Take 1 tablet (500 mg total) by mouth 2 (two) times daily.    Dispense:  60 tablet    Refill:  11    Patient Instructions  Medication Instructions:   START Ranexa one (1) tablet by mouth ( 500 mg) twice daily.   *If you need a refill on your cardiac medications before your  next appointment, please call your pharmacy*   Lab Work:  You can come in on the day  anytime between 8:00-4:30 fasting from midnight the night before.    If you have labs (blood work) drawn today and your tests are completely normal, you will receive your results only by: [place] (if you have MyChart) OR A paper copy in the mail If you have any lab test that is abnormal or we need to change your treatment, we will call you to review the results.   Testing/Procedures:  None ordered.   Follow-Up: At [facility], you and your health needs are our priority.  As part of our continuing mission to provide you with exceptional heart care, we have created designated Provider Care Teams.  These Care Teams include your primary Cardiologist (physician) and Advanced Practice Providers (APPs -  Physician Assistants and Nurse Practitioners) who all work together to provide you with the care you need, when you need it.  We recommend signing up for the patient portal called "MyChart".  Sign up information is provided on this After Visit Summary.  MyChart is used to connect with patients for Virtual Visits (Telemedicine).  Patients are able to view lab/test results, encounter notes, upcoming appointments, etc.  Non-urgent messages can be sent to your provider as well.   To learn more about what you can do with MyChart, go to [email or web address].    Your next appointment:   6 month(s)  The format for your next appointment:   In Person  Provider:   Dr. Shirlee More  Other Instructions  Adopting a Healthy Lifestyle.   Weight: Know what a healthy weight is for you (roughly BMI <25) and aim to maintain this. You can calculate your body mass index on your smart phone  Diet: Aim for 7+ servings of fruits and vegetables daily Limit animal fats in diet for cholesterol and heart health - choose grass fed whenever available Avoid highly processed foods (fast food burgers, tacos,  fried chicken,  pizza, hot dogs, french fries)  Saturated fat comes in the form of butter, lard, coconut oil, margarine, partially hydrogenated oils, and fat in meat. These increase your risk of cardiovascular disease.  Use healthy plant oils, such as olive, canola, soy, corn, sunflower and peanut.  Whole foods such as fruits, vegetables and whole grains have fiber  Men need > 38 grams of fiber per day Women need > 25 grams of fiber per day  Load up on vegetables and fruits - one-half of your plate: Aim for color and variety, and remember that potatoes dont count. Go for whole grains - one-quarter of your plate: Whole wheat, barley, wheat berries, quinoa, oats, brown rice, and foods made with them. If you want pasta, go with whole wheat pasta. Protein power - one-quarter of your plate: Fish, chicken, beans, and nuts are all healthy, versatile protein sources. Limit red meat. You need carbohydrates for energy! The type of carbohydrate is more important than the amount. Choose carbohydrates such as vegetables, fruits, whole grains, beans, and nuts in the place of white rice, white pasta, potatoes (baked or fried), macaroni and cheese, cakes, cookies, and donuts.  If youre thirsty, drink water. Coffee and tea are good in moderation, but skip sugary drinks and limit milk and dairy products to one or two daily servings. Keep sugar intake at 6 teaspoons or 24 grams or LESS       Exercise: Aim for 150 min of moderate intensity exercise weekly for heart health, and weights twice weekly for bone health Stay active - any steps are better than no steps! Aim for 7-9 hours of sleep daily      Mediterranean Diet A Mediterranean diet refers to food and lifestyle choices that are based on the traditions of countries located on the [employer]. It focuses on eating more fruits, vegetables, whole grains, beans, nuts, seeds, and heart-healthy fats, and eating less dairy, meat, eggs, and processed foods  with added sugar, salt, and fat. This way of eating has been shown to help prevent certain conditions and improve outcomes for people who have chronic diseases, like kidney disease and heart disease. What are tips for following this plan? Reading food labels Check the serving size of packaged foods. For foods such as rice and pasta, the serving size refers to the amount of cooked product, not dry. Check the total fat in packaged foods. Avoid foods that have saturated fat or trans fats. Check the ingredient list for added sugars, such as corn syrup. Shopping  Buy a variety of foods that offer a balanced diet, including: Fresh fruits and vegetables (produce). Grains, beans, nuts, and seeds. Some of these may be available in unpackaged forms or large amounts (in bulk). Fresh seafood. Poultry and eggs. Low-fat dairy products. Buy whole ingredients instead of prepackaged foods. Buy fresh fruits and vegetables in-season from local farmers markets. Buy plain frozen fruits and vegetables. If you do not have access to quality fresh seafood, buy precooked frozen shrimp or canned fish, such as tuna, salmon, or sardines. Stock your pantry so you always have certain foods on hand, such as olive oil, canned tuna, canned tomatoes, rice, pasta, and beans. Cooking Cook foods with extra-virgin olive oil instead of using butter or other vegetable oils. Have meat as a side dish, and have vegetables or grains as your main dish. This means having meat in small portions or adding small amounts of meat to foods like pasta or stew. Use beans or vegetables instead of  meat in common dishes like chili or lasagna. Experiment with different cooking methods. Try roasting, broiling, steaming, and sauting vegetables. Add frozen vegetables to soups, stews, pasta, or rice. Add nuts or seeds for added healthy fats and plant protein at each meal. You can add these to yogurt, salads, or vegetable dishes. Marinate fish or  vegetables using olive oil, lemon juice, garlic, and fresh herbs. Meal planning Plan to eat one vegetarian meal one day each week. Try to work up to two vegetarian meals, if possible. Eat seafood two or more times a week. Have healthy snacks readily available, such as: Vegetable sticks with hummus. Greek yogurt. Fruit and nut trail mix. Eat balanced meals throughout the week. This includes: Fruit: 2-3 servings a day. Vegetables: 4-5 servings a day. Low-fat dairy: 2 servings a day. Fish, poultry, or lean meat: 1 serving a day. Beans and legumes: 2 or more servings a week. Nuts and seeds: 1-2 servings a day. Whole grains: 6-8 servings a day. Extra-virgin olive oil: 3-4 servings a day. Limit red meat and sweets to only a few servings a month. Lifestyle  Cook and eat meals together with your family, when possible. Drink enough fluid to keep your urine pale yellow. Be physically active every day. This includes: Aerobic exercise like running or swimming. Leisure activities like gardening, walking, or housework. Get 7-8 hours of sleep each night. If recommended by your health care provider, drink red wine in moderation. This means 1 glass a day for nonpregnant women and 2 glasses a day for men. A glass of wine equals 5 oz (150 mL). What foods should I eat? Fruits Apples. Apricots. Avocado. Berries. Bananas. Cherries. Dates. Figs. Grapes. Lemons. Melon. Oranges. Peaches. Plums. Pomegranate. Vegetables Artichokes. Beets. Broccoli. Cabbage. Carrots. Eggplant. Green beans. Chard. Kale. Spinach. Onions. Leeks. Peas. Squash. Tomatoes. Peppers. Radishes. Grains Whole-grain pasta. Brown rice. Bulgur wheat. Polenta. Couscous. Whole-wheat bread. [place]. Meats and other proteins Beans. Almonds. Sunflower seeds. Pine nuts. Peanuts. [place]. Salmon. Scallops. Shrimp. [place]. Tilapia. Clams. Oysters. Eggs. Poultry without skin. Dairy Low-fat milk. Cheese. Greek yogurt. Fats and oils Extra-virgin  olive oil. Avocado oil. Grapeseed oil. Beverages Water. Red wine. Herbal tea. Sweets and desserts Greek yogurt with honey. Baked apples. Poached pears. Trail mix. Seasonings and condiments Basil. Cilantro. Coriander. Cumin. Mint. Parsley. Sage. Rosemary. Tarragon. Garlic. Oregano. Thyme. Pepper. Balsamic vinegar. Tahini. Hummus. Tomato sauce. Olives. Mushrooms. The items listed above may not be a complete list of foods and beverages you can eat. Contact a dietitian for more information. What foods should I limit? This is a list of foods that should be eaten rarely or only on special occasions. Fruits Fruit canned in syrup. Vegetables Deep-fried potatoes (french fries). Grains Prepackaged pasta or rice dishes. Prepackaged cereal with added sugar. Prepackaged snacks with added sugar. Meats and other proteins Beef. Pork. Lamb. Poultry with skin. Hot dogs. [place] Salines. Dairy Ice cream. Sour cream. Whole milk. Fats and oils Butter. Canola oil. Vegetable oil. Beef fat (tallow). Lard. Beverages Juice. Sugar-sweetened soft drinks. Beer. Liquor and spirits. Sweets and desserts Cookies. Cakes. Pies. Candy. Seasonings and condiments Mayonnaise. Pre-made sauces and marinades. The items listed above may not be a complete list of foods and beverages you should limit. Contact a dietitian for more information. Summary The Mediterranean diet includes both food and lifestyle choices. Eat a variety of fresh fruits and vegetables, beans, nuts, seeds, and whole grains. Limit the amount of red meat and sweets that you eat. If recommended by your health care  provider, drink red wine in moderation. This means 1 glass a day for nonpregnant women and 2 glasses a day for men. A glass of wine equals 5 oz (150 mL). This information is not intended to replace advice given to you by your health care provider. Make sure you discuss any questions you have with your health care provider. Document Revised: 01/01/2020  Document Reviewed: 10/29/2019 Elsevier Patient Education  [place]         Signed, Emmaline Life, NP  08/29/2022 11:51 AM    [place]

## 2022-08-29 ENCOUNTER — Other Ambulatory Visit: Payer: Self-pay | Admitting: *Deleted

## 2022-08-29 ENCOUNTER — Ambulatory Visit: Payer: 59 | Attending: Nurse Practitioner | Admitting: Nurse Practitioner

## 2022-08-29 ENCOUNTER — Encounter: Payer: Self-pay | Admitting: Nurse Practitioner

## 2022-08-29 VITALS — BP 124/72 | HR 61 | Ht 75.0 in | Wt 225.0 lb

## 2022-08-29 DIAGNOSIS — I10 Essential (primary) hypertension: Secondary | ICD-10-CM | POA: Diagnosis not present

## 2022-08-29 DIAGNOSIS — E785 Hyperlipidemia, unspecified: Secondary | ICD-10-CM

## 2022-08-29 DIAGNOSIS — I25708 Atherosclerosis of coronary artery bypass graft(s), unspecified, with other forms of angina pectoris: Secondary | ICD-10-CM | POA: Diagnosis not present

## 2022-08-29 DIAGNOSIS — I2510 Atherosclerotic heart disease of native coronary artery without angina pectoris: Secondary | ICD-10-CM

## 2022-08-29 MED ORDER — RANOLAZINE ER 500 MG PO TB12: 500.0000 mg | ORAL_TABLET | Freq: Two times a day (BID) | ORAL | 11 refills | Status: DC

## 2022-08-29 NOTE — Patient Instructions (Signed)
Medication Instructions:   START Ranexa one (1) tablet by mouth ( 500 mg) twice daily.   *If you need a refill on your cardiac medications before your next appointment, please call your pharmacy*   Lab Work:  You can come in on the day  anytime between 8:00-4:30 fasting from midnight the night before.    If you have labs (blood work) drawn today and your tests are completely normal, you will receive your results only by: MyChart Message (if you have MyChart) OR A paper copy in the mail If you have any lab test that is abnormal or we need to change your treatment, we will call you to review the results.   Testing/Procedures:  None ordered.   Follow-Up: At [facility], you and your health needs are our priority.  As part of our continuing mission to provide you with exceptional heart care, we have created designated Provider Care Teams.  These Care Teams include your primary Cardiologist (physician) and Advanced Practice Providers (APPs -  Physician Assistants and Nurse Practitioners) who all work together to provide you with the care you need, when you need it.  We recommend signing up for the patient portal called "MyChart".  Sign up information is provided on this After Visit Summary.  MyChart is used to connect with patients for Virtual Visits (Telemedicine).  Patients are able to view lab/test results, encounter notes, upcoming appointments, etc.  Non-urgent messages can be sent to your provider as well.   To learn more about what you can do with MyChart, go to [email or web address].    Your next appointment:   6 month(s)  The format for your next appointment:   In Person  Provider:   Dr. Norman Herrlich  Other Instructions  Adopting a Healthy Lifestyle.   Weight: Know what a healthy weight is for you (roughly BMI <25) and aim to maintain this. You can calculate your body mass index on your smart phone  Diet: Aim for 7+ servings of fruits and vegetables  daily Limit animal fats in diet for cholesterol and heart health - choose grass fed whenever available Avoid highly processed foods (fast food burgers, tacos, fried chicken, pizza, hot dogs, french fries)  Saturated fat comes in the form of butter, lard, coconut oil, margarine, partially hydrogenated oils, and fat in meat. These increase your risk of cardiovascular disease.  Use healthy plant oils, such as olive, canola, soy, corn, sunflower and peanut.  Whole foods such as fruits, vegetables and whole grains have fiber  Men need > 38 grams of fiber per day Women need > 25 grams of fiber per day  Load up on vegetables and fruits - one-half of your plate: Aim for color and variety, and remember that potatoes dont count. Go for whole grains - one-quarter of your plate: Whole wheat, barley, wheat berries, quinoa, oats, brown rice, and foods made with them. If you want pasta, go with whole wheat pasta. Protein power - one-quarter of your plate: Fish, chicken, beans, and nuts are all healthy, versatile protein sources. Limit red meat. You need carbohydrates for energy! The type of carbohydrate is more important than the amount. Choose carbohydrates such as vegetables, fruits, whole grains, beans, and nuts in the place of white rice, white pasta, potatoes (baked or fried), macaroni and cheese, cakes, cookies, and donuts.  If youre thirsty, drink water. Coffee and tea are good in moderation, but skip sugary drinks and limit milk and dairy products to one or two daily  servings. Keep sugar intake at 6 teaspoons or 24 grams or LESS       Exercise: Aim for 150 min of moderate intensity exercise weekly for heart health, and weights twice weekly for bone health Stay active - any steps are better than no steps! Aim for 7-9 hours of sleep daily      Mediterranean Diet A Mediterranean diet refers to food and lifestyle choices that are based on the traditions of countries located on the [employer].  It focuses on eating more fruits, vegetables, whole grains, beans, nuts, seeds, and heart-healthy fats, and eating less dairy, meat, eggs, and processed foods with added sugar, salt, and fat. This way of eating has been shown to help prevent certain conditions and improve outcomes for people who have chronic diseases, like kidney disease and heart disease. What are tips for following this plan? Reading food labels Check the serving size of packaged foods. For foods such as rice and pasta, the serving size refers to the amount of cooked product, not dry. Check the total fat in packaged foods. Avoid foods that have saturated fat or trans fats. Check the ingredient list for added sugars, such as corn syrup. Shopping  Buy a variety of foods that offer a balanced diet, including: Fresh fruits and vegetables (produce). Grains, beans, nuts, and seeds. Some of these may be available in unpackaged forms or large amounts (in bulk). Fresh seafood. Poultry and eggs. Low-fat dairy products. Buy whole ingredients instead of prepackaged foods. Buy fresh fruits and vegetables in-season from local farmers markets. Buy plain frozen fruits and vegetables. If you do not have access to quality fresh seafood, buy precooked frozen shrimp or canned fish, such as tuna, salmon, or sardines. Stock your pantry so you always have certain foods on hand, such as olive oil, canned tuna, canned tomatoes, rice, pasta, and beans. Cooking Cook foods with extra-virgin olive oil instead of using butter or other vegetable oils. Have meat as a side dish, and have vegetables or grains as your main dish. This means having meat in small portions or adding small amounts of meat to foods like pasta or stew. Use beans or vegetables instead of meat in common dishes like chili or lasagna. Experiment with different cooking methods. Try roasting, broiling, steaming, and sauting vegetables. Add frozen vegetables to soups, stews, pasta, or  rice. Add nuts or seeds for added healthy fats and plant protein at each meal. You can add these to yogurt, salads, or vegetable dishes. Marinate fish or vegetables using olive oil, lemon juice, garlic, and fresh herbs. Meal planning Plan to eat one vegetarian meal one day each week. Try to work up to two vegetarian meals, if possible. Eat seafood two or more times a week. Have healthy snacks readily available, such as: Vegetable sticks with hummus. Greek yogurt. Fruit and nut trail mix. Eat balanced meals throughout the week. This includes: Fruit: 2-3 servings a day. Vegetables: 4-5 servings a day. Low-fat dairy: 2 servings a day. Fish, poultry, or lean meat: 1 serving a day. Beans and legumes: 2 or more servings a week. Nuts and seeds: 1-2 servings a day. Whole grains: 6-8 servings a day. Extra-virgin olive oil: 3-4 servings a day. Limit red meat and sweets to only a few servings a month. Lifestyle  Cook and eat meals together with your family, when possible. Drink enough fluid to keep your urine pale yellow. Be physically active every day. This includes: Aerobic exercise like running or swimming. Leisure activities like  gardening, walking, or housework. Get 7-8 hours of sleep each night. If recommended by your health care provider, drink red wine in moderation. This means 1 glass a day for nonpregnant women and 2 glasses a day for men. A glass of wine equals 5 oz (150 mL). What foods should I eat? Fruits Apples. Apricots. Avocado. Berries. Bananas. Cherries. Dates. Figs. Grapes. Lemons. Melon. Oranges. Peaches. Plums. Pomegranate. Vegetables Artichokes. Beets. Broccoli. Cabbage. Carrots. Eggplant. Green beans. Chard. Kale. Spinach. Onions. Leeks. Peas. Squash. Tomatoes. Peppers. Radishes. Grains Whole-grain pasta. Brown rice. Bulgur wheat. Polenta. Couscous. Whole-wheat bread. Orpah Cobb. Meats and other proteins Beans. Almonds. Sunflower seeds. Pine nuts. Peanuts. Cod.  Salmon. Scallops. Shrimp. Tuna. Tilapia. Clams. Oysters. Eggs. Poultry without skin. Dairy Low-fat milk. Cheese. Greek yogurt. Fats and oils Extra-virgin olive oil. Avocado oil. Grapeseed oil. Beverages Water. Red wine. Herbal tea. Sweets and desserts Greek yogurt with honey. Baked apples. Poached pears. Trail mix. Seasonings and condiments Basil. Cilantro. Coriander. Cumin. Mint. Parsley. Sage. Rosemary. Tarragon. Garlic. Oregano. Thyme. Pepper. Balsamic vinegar. Tahini. Hummus. Tomato sauce. Olives. Mushrooms. The items listed above may not be a complete list of foods and beverages you can eat. Contact a dietitian for more information. What foods should I limit? This is a list of foods that should be eaten rarely or only on special occasions. Fruits Fruit canned in syrup. Vegetables Deep-fried potatoes (french fries). Grains Prepackaged pasta or rice dishes. Prepackaged cereal with added sugar. Prepackaged snacks with added sugar. Meats and other proteins Beef. Pork. Lamb. Poultry with skin. Hot dogs. Tomasa Blase. Dairy Ice cream. Sour cream. Whole milk. Fats and oils Butter. Canola oil. Vegetable oil. Beef fat (tallow). Lard. Beverages Juice. Sugar-sweetened soft drinks. Beer. Liquor and spirits. Sweets and desserts Cookies. Cakes. Pies. Candy. Seasonings and condiments Mayonnaise. Pre-made sauces and marinades. The items listed above may not be a complete list of foods and beverages you should limit. Contact a dietitian for more information. Summary The Mediterranean diet includes both food and lifestyle choices. Eat a variety of fresh fruits and vegetables, beans, nuts, seeds, and whole grains. Limit the amount of red meat and sweets that you eat. If recommended by your health care provider, drink red wine in moderation. This means 1 glass a day for nonpregnant women and 2 glasses a day for men. A glass of wine equals 5 oz (150 mL). This information is not intended to replace  advice given to you by your health care provider. Make sure you discuss any questions you have with your health care provider. Document Revised: 01/01/2020 Document Reviewed: 10/29/2019 Elsevier Patient Education  2023 Elsevier Inc.   Important Information About Sugar

## 2022-09-04 LAB — NMR, LIPOPROFILE
Cholesterol, Total: 159 mg/dL (ref 100–199)
HDL Particle Number: 30.3 umol/L — ABNORMAL LOW (ref >=30.5)
HDL-C: 28 mg/dL — ABNORMAL LOW (ref >39)
LDL Particle Number: 1258 nmol/L — ABNORMAL HIGH (ref <1000)
LDL Size: 19.6 nm — ABNORMAL LOW (ref >20.5)
LDL-C (NIH Calc): 72 mg/dL (ref 0–99)
LP-IR Score: 86 — ABNORMAL HIGH (ref <=45)
Small LDL Particle Number: 1051 nmol/L — ABNORMAL HIGH (ref <=527)
Triglycerides: 368 mg/dL — ABNORMAL HIGH (ref 0–149)

## 2022-09-04 LAB — LIPOPROTEIN A (LPA): Lipoprotein (a): <8.4 nmol/L (ref <75.0)

## 2022-09-11 ENCOUNTER — Telehealth: Payer: Self-pay | Admitting: Pharmacist

## 2022-09-11 ENCOUNTER — Ambulatory Visit: Payer: 59 | Attending: Cardiology | Admitting: Pharmacist

## 2022-09-11 DIAGNOSIS — I2510 Atherosclerotic heart disease of native coronary artery without angina pectoris: Secondary | ICD-10-CM | POA: Diagnosis not present

## 2022-09-11 DIAGNOSIS — E782 Mixed hyperlipidemia: Secondary | ICD-10-CM | POA: Diagnosis not present

## 2022-09-11 NOTE — Patient Instructions (Signed)
Please try to increase your exercise. Try walking or biking- goal would be 150 min per week. Add some strength training as you feel comfortable Please try to limit sweet tea and refined carbohydrates. Eat peanuts instead of a peanut granola bar, limit pizza and [place]  I will submit a prior authorization for Repatha or Praluent to your insurance.   Please call me at [phone] with any questions

## 2022-09-11 NOTE — Progress Notes (Signed)
Patient ID: Lee Stevens                 DOB: [birth year]                    MRN: [id number]      HPI: VANSON SOLDAN is a 61 y.o. male patient of Dr. Dulce Sellar referred to lipid clinic by Eligha Bridegroom, NP. PMH is significant for CAD, HTN, OSA, CKD Stage 3a, and HLD. Coronary CTA 11/2018 revealed coronary calcium score of 514, 96th percentile for age/sex, moderate stenosis in the mid RCA, proximal PDA, proximal PL, proximal and mid LAD, ostial small caliber ramus intermedius with FFR that did not reveal any hemodynamically significant stenosis. Patient was seen in Aug 2023 for chest pain. He underwent cardiac cath 08/09/22 which revealed mild nonobstructive coronary artery disease.  Patient stopped atorvastatin in March 2023 due to ringing in ears - got better off medication but persisted so he started back on atorvastatin and ringing intensified. Had body aches with rosuvastatin. At visit on 9/20 patient requested to re-trial statin.   Patient presents today to lipid clinic. 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 Frustrated because the ENT is making him get a hearing test prior to seeing the ENT even though he has had several. Has a high deductible plan. Has not his the deductible yet. Does a lot of yard work, but no formal exercise. [place] a decent amount for work.  Current Medications: ezetimibe 10mg  daily Intolerances: atorvastatin (ringing in ears), rosuvastatin (body aches) Risk Factors: CAD, CKD LDL goal: <55  Diet:  Breakfast: peanut bar and coffee  Lunch: [place], sir pizza, CJ, sandwich Dinner: pintos, potatoes, greens, japanese, grill Snack: peanuts Drink: sweet tea  Exercise: yard work  Family History: The patient's family history includes Heart attack in his father; Hyperlipidemia in his father; Hypertension in his mother.  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   Benign hypertension 01/19/2016   Chest pain in adult 03/11/2016   [facility] ED 12/08/15 by DR Earl Gala, EKG CXR and Troponin normal, elevated CRP and treated as pericarditis    Stress echo negative for ischemia 2013 at 15 [address]   Chronic kidney disease, stage 3 (HCC) 04/19/2017   GERD (gastroesophageal reflux disease) 06/26/2016   History of closure of ileostomy 07/03/2017   Hyperlipidemia, mixed 06/26/2016   Hypomagnesemia 08/04/2017   2018: 1.7   Hyponatremia 06/26/2017   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     Coenzyme Q10 100 MG capsule Take 100 mg by mouth daily in the afternoon.     ezetimibe (ZETIA) 10 MG tablet Take 1 tablet (10 mg total) by mouth daily. 90 tablet 3   fluticasone (FLONASE) 50 MCG/ACT nasal spray Place 2 sprays into both nostrils at bedtime.  levothyroxine (SYNTHROID) 75 MCG tablet Take 75 mcg by mouth every morning.     metoprolol succinate (TOPROL-XL) 25 MG 24 hr tablet TAKE 1 TABLET BY MOUTH ONCE DAILY 90 tablet 3   Multiple Vitamin (MULTIVITAMIN) tablet Take 1 tablet by mouth daily.     nitroGLYCERIN (NITROSTAT) 0.4 MG SL tablet Place 1 tablet (0.4 mg total) under the tongue every 5 (five) minutes as needed for chest pain. 25 tablet 3   olmesartan (BENICAR) 20 MG tablet Take 1 tablet (20 mg total) by mouth every morning. TAKE 1 TABLET EACH AM FOR HIGH BLOOD PRESSURE 90 tablet 2   omeprazole (PRILOSEC) 20 MG capsule Take 20 mg by mouth  daily.     ranolazine (RANEXA) 500 MG 12 hr tablet Take 1 tablet (500 mg total) by mouth 2 (two) times daily. 60 tablet 11   No current facility-administered medications on file prior to visit.    Allergies  Allergen Reactions   Keflex [Cephalexin] Anaphylaxis and Rash   Hydrochlorothiazide     Joint Pain.    Rosuvastatin     Myalgias (intolerance) Uncertain but stopped off      Assessment/Plan:  1. Hyperlipidemia - LDL-C is above goal of <55. His LDL-P is also high and discordant from his LDL-C. Has signs of insulin resistance. Reviewed these with patient. Reviewed his diet, advised her avoid refined carbohydrates and decrease sweet tea intake. I also strongly encouraged him to increase his exercise. States he and his wife were thinking about joining a gym. We discussed PCSK9i. Reviewed injection technique and cost. I gave him a sample of Repatha. He has a high deductible plan. The Repatha copay card will cover the first 3 months of the high deductible down to $5, but after that, only cover ~200/month. There is a yearly cap, but not sure what it is. The Praluent card covers a total of $3500/year. Reviewed this with patient. Discussed choosing a lower deductible plan for next year. Will submit a prior auth for Repatha. May have to switch to Praluent once Repatha benefits exhausted if cost is a barrier.   Thank you,   Ramond Dial, Pharm.D, BCPS, CPP [facility] of [facility] [address] [place], [place] [address]  Phone: [phone]; Fax: [phone]

## 2022-09-11 NOTE — Telephone Encounter (Signed)
PA for Repatha submitted [place]: [place]

## 2022-09-13 NOTE — Telephone Encounter (Signed)
Repatha approved through 09/12/23. Pt called and LVM for him to call back. Will need to confirm pharmacy, DS and remind him to get a copay card.

## 2022-09-17 MED ORDER — REPATHA SURECLICK 140 MG/ML ~~LOC~~ SOAJ: 1.0000 mL | SUBCUTANEOUS | 3 refills | Status: DC

## 2022-09-17 NOTE — Telephone Encounter (Signed)
Spoke with patient. Reminded him to get a copay card. He will go to [place] office for labs in Dec after at least 4 injections. Orders placed. Rx sent to [facility].

## 2022-10-10 ENCOUNTER — Ambulatory Visit: Payer: 59

## 2022-12-31 ENCOUNTER — Encounter: Payer: Self-pay | Admitting: Pharmacist

## 2023-01-03 LAB — NMR, LIPOPROFILE
Cholesterol, Total: 74 mg/dL — ABNORMAL LOW (ref 100–199)
HDL Particle Number: 35.9 umol/L (ref >=30.5)
HDL-C: 34 mg/dL — ABNORMAL LOW (ref >39)
LDL Particle Number: <300 nmol/L (ref <1000)
LDL-C (NIH Calc): 8 mg/dL (ref 0–99)
LP-IR Score: 70 — ABNORMAL HIGH (ref <=45)
Small LDL Particle Number: <90 nmol/L (ref <=527)
Triglycerides: 207 mg/dL — ABNORMAL HIGH (ref 0–149)

## 2023-01-03 LAB — APOLIPOPROTEIN B: Apolipoprotein B: 29 mg/dL (ref <90)

## 2023-01-07 ENCOUNTER — Other Ambulatory Visit: Payer: Self-pay | Admitting: Pharmacist

## 2023-01-17 ENCOUNTER — Other Ambulatory Visit: Payer: Self-pay | Admitting: Cardiology

## 2023-01-18 ENCOUNTER — Other Ambulatory Visit: Payer: Self-pay

## 2023-01-18 DIAGNOSIS — E782 Mixed hyperlipidemia: Secondary | ICD-10-CM

## 2023-01-18 DIAGNOSIS — I2510 Atherosclerotic heart disease of native coronary artery without angina pectoris: Secondary | ICD-10-CM

## 2023-01-18 DIAGNOSIS — I129 Hypertensive chronic kidney disease with stage 1 through stage 4 chronic kidney disease, or unspecified chronic kidney disease: Secondary | ICD-10-CM

## 2023-01-18 DIAGNOSIS — Q2112 Patent foramen ovale: Secondary | ICD-10-CM

## 2023-01-18 MED ORDER — METOPROLOL SUCCINATE ER 25 MG PO TB24: ORAL_TABLET | ORAL | 3 refills | Status: DC

## 2023-02-08 ENCOUNTER — Encounter: Payer: Self-pay | Admitting: Cardiology

## 2023-02-11 MED ORDER — OLMESARTAN MEDOXOMIL 20 MG PO TABS: 20.0000 mg | ORAL_TABLET | Freq: Every day | ORAL | 0 refills | Status: DC

## 2023-02-18 ENCOUNTER — Encounter: Payer: Self-pay | Admitting: Cardiology

## 2023-02-18 NOTE — Progress Notes (Signed)
Cardiology Office Note:    Date:  02/19/2023   ID:  Lee Stevens, DOB 24-Mar-1961, MRN [id number]  PCP:  Algis Greenhouse, MD  Cardiologist:  Shirlee More, MD    Referring MD: Algis Greenhouse, MD    ASSESSMENT:    1. Syncope and collapse   2. CAD in native artery   3. Hyperlipidemia, mixed   4. Essential hypertension    PLAN:    In order of problems listed above:  Recent syncopal episode in the context of pneumonia and easily demonstrated orthostatic hypotension.  He has a smart watch program and has had no high or low rate activity documented.  I do not think he requires a monitor at this time I would ask him to reduce a bee 3 days a week he will trend blood pressure sitting and standing for 2 weeks and me a copy of his standing blood pressure remains less than 105 then discontinue his ARB. Stable CAD he needs antiplatelet therapy we will start clopidogrel and continue lipid-lowering with Repatha  Hypertension see discussion below  Next appointment: 3 months   Medication Adjustments/Labs and Tests Ordered: Current medicines are reviewed at length with the patient today.  Concerns regarding medicines are outlined above.  No orders of the defined types were placed in this encounter.  No orders of the defined types were placed in this encounter.   No chief complaint on file.   History of Present Illness:    Lee Stevens is a 62 y.o. male with a hx of CAD hypertension and hyperlipidemia last seen 03/08/2021.  Compliance with diet, lifestyle and medications: Yes  History reviewed time of his syncope had fever in excess of 103 likely atypical pneumonia with very little respiratory symptoms but was profoundly weak. Previously has been lightheaded shifting posture Today in my office standing blood pressure is 96 systolic Please stop taking aspirin due to tinnitus He has had no chest pain edema shortness of breath or palpitation He was aware he is going to faint before he  fell to the ground it was actually precipitated when he stopped doing it still and trying to compose himself.  He was seen in [facility] ED 01/06/2023 for syncopal episode.  Vital signs reported as febrile temperature 99.6 pulse rate 79 blood pressure 134/78.  He was seen in urgent care with fever and cough and had what was described in the ED note is near syncope.  Laboratory studies showed an hemoconcentrated hemoglobin 16.8 creatinine 1.60 potassium 4.0 EKG was described as sinus rhythm left anterior hemiblock.  CT of the chest showed air bronchograms left lower lobe consistent with pneumonia no evidence of pulmonary embolism.  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  Obstructive sleep apnea 06/26/2016   Prediabetes 07/02/2016   2018: 121/5.6   SBO (small bowel obstruction) ([place]) 06/24/2017   Subclinical hypothyroidism 06/26/2016    Past Surgical History:  Procedure Laterality Date   APPENDECTOMY     CARDIAC CATHETERIZATION     CHOLECYSTECTOMY     ILEOSTOMY     LEFT HEART CATH AND CORONARY ANGIOGRAPHY N/A 08/09/2022   Procedure: LEFT HEART CATH AND CORONARY ANGIOGRAPHY;  Surgeon: Jettie Booze, MD;  Location: [facility];  Service: Cardiovascular;   Laterality: N/A;   TONSILLECTOMY AND ADENOIDECTOMY     TOTAL HIP ARTHROPLASTY      Current Medications: Current Meds  Medication Sig   Evolocumab (REPATHA SURECLICK) [id number] MG/ML SOAJ Inject 140 mg into the skin every 14 (fourteen) days.   fluticasone (FLONASE) 50 MCG/ACT nasal spray Place 2 sprays into both nostrils at bedtime.   levothyroxine (SYNTHROID) 75 MCG tablet Take 75 mcg by mouth every morning.   metoprolol succinate (TOPROL-XL) 25 MG 24 hr tablet TAKE 1 TABLET BY MOUTH ONCE DAILY   Multiple Vitamin (MULTIVITAMIN) tablet Take 1 tablet by mouth daily.   nitroGLYCERIN (NITROSTAT) 0.4 MG SL tablet Place 1 tablet (0.4 mg total) under the tongue every 5 (five) minutes as needed for chest pain.   omeprazole (PRILOSEC) 20 MG capsule Take 20 mg by mouth daily.   [DISCONTINUED] olmesartan (BENICAR) 20 MG tablet Take 1 tablet (20 mg total) by mouth daily. Patient must keep appointment for 02/19/23 for further refills. 3 rd/final attempt     Allergies:   Keflex [cephalexin], Hydrochlorothiazide, and Rosuvastatin   Social History   Socioeconomic History   Marital status: Married    Spouse name: Not on file   Number of children: Not on file   Years of education: Not on file   Highest education level: Not on file  Occupational History   Not on file  Tobacco Use   Smoking status: Never   Smokeless tobacco: Former    Types: Chew    Quit date: 2014  Vaping Use   Vaping Use: Never used  Substance and Sexual Activity   Alcohol use: Not Currently   Drug use: Never   Sexual activity: Not on file  Other Topics Concern   Not on file  Social History Narrative   Not on file   Social Determinants of Health   Financial Resource Strain: Not on file  Food Insecurity: Not on file  Transportation Needs: Not on file  Physical Activity: Not on file  Stress: Not on file  Social Connections: Not on file     Family History: The patient's family history includes Heart attack in his father;  Hyperlipidemia in his father; Hypertension in his mother. ROS:   Please see the history of present illness.    All other systems reviewed and are negative.  EKGs/Labs/Other Studies Reviewed:    The following studies were reviewed today:  Cardiac Studies & Procedures   CARDIAC CATHETERIZATION  CARDIAC CATHETERIZATION 08/09/2022  Narrative   Mid LAD lesion is 40% stenosed.   LV end diastolic pressure is mildly elevated.  LVEDP 20 mm Hg.   There is no aortic valve stenosis.  Mild, nonobstructive coronary artery disease.  Very large, dominant right coronary artery which feeds the apex.  LAD and its branches are generally small and diffusely diseased.  Continue medical therapy.  We spoke about the importance of healthy diet, regular exercise.  Findings Coronary Findings Diagnostic  Dominance: Right  Left Anterior Descending There is mild diffuse disease throughout the vessel. Mid LAD lesion is 40% stenosed. The lesion is eccentric.  Left Circumflex There is mild diffuse disease throughout the vessel.  Right Coronary Artery Vessel is large. The vessel exhibits minimal luminal irregularities.  Intervention  No interventions have been documented.        CT SCANS  CT CORONARY MORPH W/CTA COR W/SCORE 11/16/2018  Addendum 11/16/2018  2:01 PM ADDENDUM REPORT: 11/16/2018 13:59  HISTORY: atypical angina  EXAM: Cardiac/Coronary  CT  TECHNIQUE: The patient was scanned on a [employer].  PROTOCOL: A 120 kV prospective scan was triggered in the descending thoracic aorta at 111 HU's. Axial non-contrast 3 mm slices were carried out through the heart. The data set was analyzed on a dedicated work station and scored using the [place]. Gantry rotation speed was 250 msecs and collimation was .6 mm. No beta blockade and 0.8 mg of sl NTG was given. The 3D data set was reconstructed in 5% intervals of the 67-82 % of the R-R cycle. Diastolic phases were analyzed on a  dedicated work station using MPR, MIP and VRT modes. The patient received 80 cc of contrast.  FINDINGS: Coronary calcium score: The patient's coronary artery calcium score is 514, which places the patient in the 96th percentile.  Coronary arteries: Normal coronary origins.  Right dominance.  Right Coronary Artery: Mild ostial mixed atherosclerotic plaque, 25-49% stenosis. Moderate mixed atherosclerotic plaque in the mid RCA, 50-69% stenosis. Moderate primarly atherosclerotic plaque, with 50-69% stenosis in the proximal PDA branch. Moderate mixed atherosclerotic plaque in the proximal posterolateral branch, 50-69% stenosis. Mild diffused mixed plaque throughout the RCA.  Left Main Coronary Artery: Mild mixed atherosclerotic plaque in the ostial left main, 25-49% stenosis. Short left main.  Left Anterior Descending Coronary Artery: Moderate diffuse irregular mixed atherosclerotic disease in the proximal and mid LAD, 50-69% stenosis. In the mid to distal LAD, disease may be severe, image quality limits exact luminal assessment. Main body of the LAD is small caliber. There is a large diagonal branch that creates a dual LAD system, with moderate ostial mixed atherosclerotic plaque, 50-69% stenosis.  Ramus intermedius: Small caliber ramus intermedius with moderate ostial and proximal tubular atherosclerotic plaque, 50-69% stenosis.  Left Circumflex Artery: Mild mixed atherosclerotic plaque at the ostial circumflex artery. Minimal scattered disease throughout the vessel. Motion vs breath hold artifact limits the assessment middle portion of the proximal circumflex, cannot evaluate this segment accurately. Patent OM1 with mild diffuse disease.  Aorta:  Normal size.  No calcifications.  No dissection.  Aortic Valve: No calcifications.  Other findings:  Normal pulmonary vein drainage into the left atrium.  Normal left atrial appendage without a thrombus.  Normal size of the  pulmonary artery.  Patent foramen ovale with left to right shunt.  IMPRESSION: 1. Coronary calcium score is 514, which places the patient in the 96th percentile for age and sex matched control.  2. Normal coronary origin with right dominance.  3. Moderate CAD, CADRADS = 3. Moderate stenosis in the mid RCA, proximal PDA, proximal PL, proximal and mid LAD, ostial small caliber ramus intermedius. CT FFR analysis will be performed and reported separately.  4. Patent foramen ovale with left to right shunt demonstrated on cine images.   Electronically Signed By: Cherlynn Kaiser On: 11/16/2018 13:59  Narrative EXAM: OVER-READ INTERPRETATION  CT CHEST  The following report is an over-read performed by radiologist Dr. Markus Daft of [facility] on  11/14/2018. This over-read does not include interpretation of cardiac or coronary anatomy or pathology. The coronary calcium score/coronary CTA interpretation by the cardiologist is attached.  COMPARISON:  Chest radiograph 07/09/2017  FINDINGS: Vascular: Normal caliber of the visualized thoracic aorta without atherosclerotic disease. Heart size is normal. Normal caliber of the pulmonary arteries. Main pulmonary arteries are patent.  Mediastinum/Nodes: Small mediastinal lymph nodes. No significant hilar lymphadenopathy. Esophagus is unremarkable.  Lungs/Pleura: Few hazy densities in the right lower lobe are nonspecific but likely represent atelectasis. Question a 6 x 6 mm nodular density in the right lower lobe on sequence 12, image 13. No significant airspace disease or consolidation in the lungs. No large pleural effusions. Focal volume loss or scarring in the left lower lobe.  Upper Abdomen: There appears to be a calcification near the gallbladder wall and not clear if this is related to a gallstone.  Musculoskeletal: No acute abnormality.  IMPRESSION: 1. Question a 6 mm nodule in the right lower lobe. There may  be artifact in this area and this nodular density is indeterminate. Non-contrast chest CT at 6-12 months is recommended. If the nodule is stable at time of repeat CT, then future CT at 18-24 months (from today's scan) is considered optional for low-risk patients, but is recommended for high-risk patients. This recommendation follows the consensus statement: Guidelines for Management of Incidental Pulmonary Nodules Detected on CT Images: From the Fleischner Society 2017; Radiology 2017; 284:228-243. 2. Calcification near the periphery or wall of the gallbladder. This is indeterminate and incompletely evaluated. Not clear if this is related to a gallstone. This could be further characterized with a right upper quadrant ultrasound if this is an area of concern.  Electronically Signed: By: Markus Daft M.D. On: 11/14/2018 17:12          See history  Physical Exam:    VS:  BP 96/60 (Patient Position: Sitting)   Pulse 62   Ht '6\' 3"'$  (1.905 m)   Wt 231 lb (104.8 kg)   SpO2 97%   BMI 28.87 kg/m     Wt Readings from Last 3 Encounters:  02/19/23 231 lb (104.8 kg)  08/29/22 225 lb (102.1 kg)  08/09/22 225 lb (102.1 kg)     GEN:  Well nourished, well developed in no acute distress HEENT: Normal NECK: No JVD; No carotid bruits LYMPHATICS: No lymphadenopathy CARDIAC: RRR, no murmurs, rubs, gallops RESPIRATORY:  Clear to auscultation without rales, wheezing or rhonchi  ABDOMEN: Soft, non-tender, non-distended MUSCULOSKELETAL:  No edema; No deformity  SKIN: Warm and dry NEUROLOGIC:  Alert and oriented x 3 PSYCHIATRIC:  Normal affect    Signed, Shirlee More, MD  02/19/2023 8:29 AM    [place]

## 2023-02-19 ENCOUNTER — Ambulatory Visit: Payer: 59 | Attending: Cardiology | Admitting: Cardiology

## 2023-02-19 ENCOUNTER — Encounter: Payer: Self-pay | Admitting: Cardiology

## 2023-02-19 VITALS — BP 96/60 | HR 62 | Ht 75.0 in | Wt 231.0 lb

## 2023-02-19 DIAGNOSIS — E782 Mixed hyperlipidemia: Secondary | ICD-10-CM

## 2023-02-19 DIAGNOSIS — I2510 Atherosclerotic heart disease of native coronary artery without angina pectoris: Secondary | ICD-10-CM

## 2023-02-19 DIAGNOSIS — I10 Essential (primary) hypertension: Secondary | ICD-10-CM | POA: Diagnosis not present

## 2023-02-19 DIAGNOSIS — R55 Syncope and collapse: Secondary | ICD-10-CM

## 2023-02-19 MED ORDER — OLMESARTAN MEDOXOMIL 20 MG PO TABS: 20.0000 mg | ORAL_TABLET | ORAL | 3 refills | Status: DC

## 2023-02-19 MED ORDER — CLOPIDOGREL BISULFATE 75 MG PO TABS: 75.0000 mg | ORAL_TABLET | Freq: Every day | ORAL | 3 refills | Status: DC

## 2023-02-19 NOTE — Patient Instructions (Signed)
Medication Instructions:  Your physician has recommended you make the following change in your medication:   START: Clopidogrel 75 mg daily START: Benicar 20 mg every M-W-F  *If you need a refill on your cardiac medications before your next appointment, please call your pharmacy*   Lab Work: None If you have labs (blood work) drawn today and your tests are completely normal, you will receive your results only by: [place] (if you have MyChart) OR A paper copy in the mail If you have any lab test that is abnormal or we need to change your treatment, we will call you to review the results.   Testing/Procedures: None   Follow-Up: At [facility], you and your health needs are our priority.  As part of our continuing mission to provide you with exceptional heart care, we have created designated Provider Care Teams.  These Care Teams include your primary Cardiologist (physician) and Advanced Practice Providers (APPs -  Physician Assistants and Nurse Practitioners) who all work together to provide you with the care you need, when you need it.  We recommend signing up for the patient portal called "MyChart".  Sign up information is provided on this After Visit Summary.  MyChart is used to connect with patients for Virtual Visits (Telemedicine).  Patients are able to view lab/test results, encounter notes, upcoming appointments, etc.  Non-urgent messages can be sent to your provider as well.   To learn more about what you can do with MyChart, go to [email or web address].    Your next appointment:   3 month(s)  Provider:   Shirlee More, MD    Other Instructions Check your blood pressures both sitting and standing for two weeks and after two weeks send a list of blood pressures to the office.

## 2023-03-27 DIAGNOSIS — K111 Hypertrophy of salivary gland: Secondary | ICD-10-CM | POA: Insufficient documentation

## 2023-03-27 HISTORY — DX: Hypertrophy of salivary gland: K11.1

## 2023-03-27 NOTE — Progress Notes (Signed)
Cardiology Office Note:    Date:  03/28/2023   ID:  Lee Stevens, DOB July 25, 1961, MRN [id number]  PCP:  Olive Bass, MD   [facility] Providers Cardiologist:  Norman Herrlich, MD     Referring MD: Olive Bass, MD     History of Present Illness:    Lee Stevens is a 62 y.o. male with a hx of CAD, hypertension, OSA, GERD, hypothyroidism, CKD stage III, anxiety, hyperlipidemia intolerant of statins.  He establish care with Dr. Dulce Sellar in 2017 for evaluation of chest pain that had been recurrent for at least a decade prior.  Coronary CTA in 2019 which revealed a calcium score of 514 placing him in the 96 percentile, moderate stenosis in the mid RCA, proximal PDA, proximal PL, proximal and mid LAD, ostial small caliber ramus intermedius. FFR analysis did not reveal any hemodynamically significant stenosis.  He was evaluated 08/07/2022 with concerns of sharp chest pain located in the center of his chest, pain could last for seconds to an hour, causing him to feel sick all over his body.  The decision was made to proceed with a left heart cath.  LHC on 08/09/2022 revealed mild nonobstructive coronary artery disease.  Most recently he was evaluated by Dr. Dulce Sellar on 02/19/2023 for recent syncopal episode in the context of pneumonia and hypotension.  He was to trend his blood pressure over the next few weeks and if his systolic remains less than 105, could discontinue his ARB.  Due to his known CAD he was started on Plavix.  He presents today for follow-up at the behest of his PCP after recent episode with atypical chest pain.  He was eating dinner out with a colleague, had a funny sensation, felt lightheaded, dizzy, sick on his stomach along with some chest tightness.  He also explains several other similar events that "seemingly occur out of nowhere", he begins to get tearful when explaining these and voices that it has been very frustrating.  Previously he had been diagnosed with  anxiety in the past and had Xanax that he could take as needed, however he had misplaced his bottle. He was evaluated by his PCP at the beginning of this week and was given a new prescription for Xanax--they requested he follow-up with our office for completeness.  He has taken his Xanax x 1, noticed he felt better and was able to sleep through the night.  PHQ-9 was 6. He currently denies chest pain, palpitations, dyspnea, pnd, orthopnea, n, v, dizziness, syncope, edema, weight gain, or early satiety.   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   Benign hypertension 01/19/2016   Chest pain in adult 03/11/2016   Seen at [facility] ED 12/08/15 by DR Earl Gala, EKG CXR and Troponin normal, elevated CRP and treated as pericarditis    Stress echo negative for ischemia 2013 at [address]   Chronic kidney disease, stage 3 04/19/2017   GERD (gastroesophageal reflux disease) 06/26/2016   History of closure of ileostomy 07/03/2017   Hyperlipidemia, mixed 06/26/2016   Hypomagnesemia 08/04/2017   2018: 1.7   Hyponatremia 06/26/2017   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   Obstructive sleep apnea 06/26/2016   Prediabetes 07/02/2016   2018: 121/5.6   SBO (small bowel obstruction) 06/24/2017   Subclinical hypothyroidism 06/26/2016    Past Surgical History:  Procedure Laterality Date   APPENDECTOMY  CARDIAC CATHETERIZATION     CHOLECYSTECTOMY     ILEOSTOMY     LEFT HEART CATH AND CORONARY ANGIOGRAPHY N/A 08/09/2022   Procedure: LEFT HEART CATH AND CORONARY ANGIOGRAPHY;  Surgeon: Corky Crafts, MD;  Location: [facility] INVASIVE CV LAB;  Service: Cardiovascular;  Laterality: N/A;   TONSILLECTOMY AND ADENOIDECTOMY     TOTAL HIP ARTHROPLASTY      Current Medications: Current Meds  Medication Sig   ALPRAZolam (XANAX) 0.25 MG tablet Take 0.25 mg by mouth as needed for anxiety.   clopidogrel (PLAVIX) 75 MG tablet  Take 1 tablet (75 mg total) by mouth daily.   Evolocumab (REPATHA SURECLICK) 140 MG/ML SOAJ Inject 140 mg into the skin every 14 (fourteen) days.   fluticasone (FLONASE) 50 MCG/ACT nasal spray Place 2 sprays into both nostrils at bedtime.   levothyroxine (SYNTHROID) 75 MCG tablet Take 75 mcg by mouth every morning.   Melatonin 1 MG CAPS Take 1 capsule (1 mg total) by mouth at bedtime.   metoprolol succinate (TOPROL-XL) 25 MG 24 hr tablet TAKE 1 TABLET BY MOUTH ONCE DAILY   Multiple Vitamin (MULTIVITAMIN) tablet Take 1 tablet by mouth daily.   nitroGLYCERIN (NITROSTAT) 0.4 MG SL tablet Place 1 tablet (0.4 mg total) under the tongue every 5 (five) minutes as needed for chest pain.   olmesartan (BENICAR) 20 MG tablet Take 1 tablet (20 mg total) by mouth every Monday, Wednesday, and Friday.   omeprazole (PRILOSEC) 20 MG capsule Take 20 mg by mouth daily.     Allergies:   Keflex [cephalexin], Hydrochlorothiazide, and Rosuvastatin   Social History   Socioeconomic History   Marital status: Married    Spouse name: Not on file   Number of children: Not on file   Years of education: Not on file   Highest education level: Not on file  Occupational History   Not on file  Tobacco Use   Smoking status: Never   Smokeless tobacco: Former    Types: Chew    Quit date: 2014  Vaping Use   Vaping Use: Never used  Substance and Sexual Activity   Alcohol use: Not Currently   Drug use: Never   Sexual activity: Not on file  Other Topics Concern   Not on file  Social History Narrative   Not on file   Social Determinants of Health   Financial Resource Strain: Not on file  Food Insecurity: Not on file  Transportation Needs: Not on file  Physical Activity: Not on file  Stress: Not on file  Social Connections: Not on file     Family History: The patient's family history includes Heart attack in his father; Hyperlipidemia in his father; Hypertension in his mother.  ROS:   Please see the  history of present illness.     All other systems reviewed and are negative.  EKGs/Labs/Other Studies Reviewed:    The following studies were reviewed today:  Cardiac Studies & Procedures   CARDIAC CATHETERIZATION  CARDIAC CATHETERIZATION 08/09/2022  Narrative   Mid LAD lesion is 40% stenosed.   LV end diastolic pressure is mildly elevated.  LVEDP 20 mm Hg.   There is no aortic valve stenosis.  Mild, nonobstructive coronary artery disease.  Very large, dominant right coronary artery which feeds the apex.  LAD and its branches are generally small and diffusely diseased.  Continue medical therapy.  We spoke about the importance of healthy diet, regular exercise.  Findings Coronary Findings Diagnostic  Dominance: Right  Left  Anterior Descending There is mild diffuse disease throughout the vessel. Mid LAD lesion is 40% stenosed. The lesion is eccentric.  Left Circumflex There is mild diffuse disease throughout the vessel.  Right Coronary Artery Vessel is large. The vessel exhibits minimal luminal irregularities.  Intervention  No interventions have been documented.        CT SCANS  CT CORONARY MORPH W/CTA COR W/SCORE 11/16/2018  Addendum 11/16/2018  2:01 PM ADDENDUM REPORT: 11/16/2018 13:59  HISTORY: atypical angina  EXAM: Cardiac/Coronary  CT  TECHNIQUE: The patient was scanned on a [employer].  PROTOCOL: A 120 kV prospective scan was triggered in the descending thoracic aorta at 111 HU's. Axial non-contrast 3 mm slices were carried out through the heart. The data set was analyzed on a dedicated work station and scored using the Agatson method. Gantry rotation speed was 250 msecs and collimation was .6 mm. No beta blockade and 0.8 mg of sl NTG was given. The 3D data set was reconstructed in 5% intervals of the 67-82 % of the R-R cycle. Diastolic phases were analyzed on a dedicated work station using MPR, MIP and VRT modes. The patient received 80 cc  of contrast.  FINDINGS: Coronary calcium score: The patient's coronary artery calcium score is 514, which places the patient in the 96th percentile.  Coronary arteries: Normal coronary origins.  Right dominance.  Right Coronary Artery: Mild ostial mixed atherosclerotic plaque, 25-49% stenosis. Moderate mixed atherosclerotic plaque in the mid RCA, 50-69% stenosis. Moderate primarly atherosclerotic plaque, with 50-69% stenosis in the proximal PDA branch. Moderate mixed atherosclerotic plaque in the proximal posterolateral branch, 50-69% stenosis. Mild diffused mixed plaque throughout the RCA.  Left Main Coronary Artery: Mild mixed atherosclerotic plaque in the ostial left main, 25-49% stenosis. Short left main.  Left Anterior Descending Coronary Artery: Moderate diffuse irregular mixed atherosclerotic disease in the proximal and mid LAD, 50-69% stenosis. In the mid to distal LAD, disease may be severe, image quality limits exact luminal assessment. Main body of the LAD is small caliber. There is a large diagonal branch that creates a dual LAD system, with moderate ostial mixed atherosclerotic plaque, 50-69% stenosis.  Ramus intermedius: Small caliber ramus intermedius with moderate ostial and proximal tubular atherosclerotic plaque, 50-69% stenosis.  Left Circumflex Artery: Mild mixed atherosclerotic plaque at the ostial circumflex artery. Minimal scattered disease throughout the vessel. Motion vs breath hold artifact limits the assessment middle portion of the proximal circumflex, cannot evaluate this segment accurately. Patent OM1 with mild diffuse disease.  Aorta:  Normal size.  No calcifications.  No dissection.  Aortic Valve: No calcifications.  Other findings:  Normal pulmonary vein drainage into the left atrium.  Normal left atrial appendage without a thrombus.  Normal size of the pulmonary artery.  Patent foramen ovale with left to right shunt.  IMPRESSION: 1.  Coronary calcium score is 514, which places the patient in the 96th percentile for age and sex matched control.  2. Normal coronary origin with right dominance.  3. Moderate CAD, CADRADS = 3. Moderate stenosis in the mid RCA, proximal PDA, proximal PL, proximal and mid LAD, ostial small caliber ramus intermedius. CT FFR analysis will be performed and reported separately.  4. Patent foramen ovale with left to right shunt demonstrated on cine images.   Electronically Signed By: Weston Brass On: 11/16/2018 13:59  Narrative EXAM: OVER-READ INTERPRETATION  CT CHEST  The following report is an over-read performed by radiologist Dr. Richarda Overlie of [facility] on 11/14/2018.  This over-read does not include interpretation of cardiac or coronary anatomy or pathology. The coronary calcium score/coronary CTA interpretation by the cardiologist is attached.  COMPARISON:  Chest radiograph 07/09/2017  FINDINGS: Vascular: Normal caliber of the visualized thoracic aorta without atherosclerotic disease. Heart size is normal. Normal caliber of the pulmonary arteries. Main pulmonary arteries are patent.  Mediastinum/Nodes: Small mediastinal lymph nodes. No significant hilar lymphadenopathy. Esophagus is unremarkable.  Lungs/Pleura: Few hazy densities in the right lower lobe are nonspecific but likely represent atelectasis. Question a 6 x 6 mm nodular density in the right lower lobe on sequence 12, image 13. No significant airspace disease or consolidation in the lungs. No large pleural effusions. Focal volume loss or scarring in the left lower lobe.  Upper Abdomen: There appears to be a calcification near the gallbladder wall and not clear if this is related to a gallstone.  Musculoskeletal: No acute abnormality.  IMPRESSION: 1. Question a 6 mm nodule in the right lower lobe. There may be artifact in this area and this nodular density is indeterminate. Non-contrast chest  CT at 6-12 months is recommended. If the nodule is stable at time of repeat CT, then future CT at 18-24 months (from today's scan) is considered optional for low-risk patients, but is recommended for high-risk patients. This recommendation follows the consensus statement: Guidelines for Management of Incidental Pulmonary Nodules Detected on CT Images: From the Fleischner Society 2017; Radiology 2017; 284:228-243. 2. Calcification near the periphery or wall of the gallbladder. This is indeterminate and incompletely evaluated. Not clear if this is related to a gallstone. This could be further characterized with a right upper quadrant ultrasound if this is an area of concern.  Electronically Signed: By: Richarda Overlie M.D. On: 11/14/2018 17:12          EKG:  EKG is  ordered today.  The ekg ordered today demonstrates sinus rhythm, LAFB, heart rate 66 bpm, consistent with prior EKG tracings.  Recent Labs: 08/07/2022: BUN 18; Creatinine, Ser 1.63; Hemoglobin 15.7; Platelets 244; Potassium 4.3; Sodium 138  Recent Lipid Panel No results found for: "CHOL", "TRIG", "HDL", "CHOLHDL", "VLDL", "LDLCALC", "LDLDIRECT"   Risk Assessment/Calculations:                Physical Exam:    VS:  BP 115/70 Comment: home bp reading today  Pulse 66   Ht 6\' 3"  (1.905 m)   Wt 232 lb (105.2 kg)   SpO2 94%   BMI 29.00 kg/m     Wt Readings from Last 3 Encounters:  03/28/23 232 lb (105.2 kg)  02/19/23 231 lb (104.8 kg)  08/29/22 225 lb (102.1 kg)     GEN:  Well nourished, well developed in no acute distress HEENT: Normal NECK: No JVD; No carotid bruits LYMPHATICS: No lymphadenopathy CARDIAC: RRR, no murmurs, rubs, gallops RESPIRATORY:  Clear to auscultation without rales, wheezing or rhonchi  ABDOMEN: Soft, non-tender, non-distended MUSCULOSKELETAL:  No edema; No deformity  SKIN: Warm and dry NEUROLOGIC:  Alert and oriented x 3 PSYCHIATRIC:  Normal affect   ASSESSMENT:    1. Hyperlipidemia,  mixed   2. Essential hypertension   3. CAD in native artery   4. Anxiety   5. Depression, unspecified depression type    PLAN:    In order of problems listed above:  Anxiety/depression-most recently evaluated by his PCP and was started on Xanax 0.25 mg as needed.  He did take x 1 last night, slept better than he has in a very  long time.  We discussed depression, PHQ-9 6, would likely benefit from SSRI, encouraged him to follow back up with his PCP for further discussion.  He can take melatonin 1 mg as needed at bedtime for sleep.  CAD-on 08/09/2022 revealed mild nonobstructive coronary artery disease.  Continue Plavix 75 mg daily, continue Repatha 140 mg injection every 14 days, continue metoprolol 25 mg once daily, continue nitroglycerin--has not needed.  Recent chest pain event that prompted today's episode as outlined in HPI, does not sound to be cardiac in nature, most consistent with panic attack. Stable with no anginal symptoms. No indication for ischemic evaluation.    Hyperlipidemia -LPA on 09/03/2022 was 8.4, LDL 72.  Continue Repatha, well-managed. Heart healthy diet and regular cardiovascular exercise encouraged.    Hypertension-blood pressure elevated today 140/90 in office, at home 115/70, reports it is typically well-controlled at home but has been marginally elevated over the last few days since his panic attack.  He is tearful in the office today.  Most recently there was discussions about reducing his Benicar dose so we will not increase any of his medications today as his blood pressure readings at home are well-controlled.  Continue Toprol 25 mg daily, continue Benicar 20 mg daily.  He will continue to check his blood pressure at home and notify our office of readings.  Disposition-already has appointment scheduled in a few weeks with Dr. Dulce Sellar, if he begins to feel better after evaluation for depression/anxiety, he may call and cancel this appointment.         Medication  Adjustments/Labs and Tests Ordered: Current medicines are reviewed at length with the patient today.  Concerns regarding medicines are outlined above.  Orders Placed This Encounter  Procedures   EKG 12-Lead   Meds ordered this encounter  Medications   Melatonin 1 MG CAPS    Sig: Take 1 capsule (1 mg total) by mouth at bedtime.    Dispense:  90 capsule    Refill:  3    Patient Instructions  .Medication Instructions:  Your physician has recommended you make the following change in your medication:   START: Melatonin 1 mg at bedtime for sleep  *If you need a refill on your cardiac medications before your next appointment, please call your pharmacy*   Lab Work: None If you have labs (blood work) drawn today and your tests are completely normal, you will receive your results only by: MyChart Message (if you have MyChart) OR A paper copy in the mail If you have any lab test that is abnormal or we need to change your treatment, we will call you to review the results.   Testing/Procedures: None   Follow-Up: At [facility], you and your health needs are our priority.  As part of our continuing mission to provide you with exceptional heart care, we have created designated Provider Care Teams.  These Care Teams include your primary Cardiologist (physician) and Advanced Practice Providers (APPs -  Physician Assistants and Nurse Practitioners) who all work together to provide you with the care you need, when you need it.  We recommend signing up for the patient portal called "MyChart".  Sign up information is provided on this After Visit Summary.  MyChart is used to connect with patients for Virtual Visits (Telemedicine).  Patients are able to view lab/test results, encounter notes, upcoming appointments, etc.  Non-urgent messages can be sent to your provider as well.   To learn more about what you can do with  MyChart, go to [email or web address].    Your next appointment:    Keep appointment already scheduled with Dr. Dulce Sellar  Provider:   Wallis Bamberg, NP Rosalita Levan)    Other Instructions Speak with PCP about antidepressant medication    Signed, Flossie Dibble, NP  03/28/2023 10:09 AM    [facility]

## 2023-03-28 ENCOUNTER — Encounter: Payer: Self-pay | Admitting: Cardiology

## 2023-03-28 ENCOUNTER — Ambulatory Visit: Payer: 59 | Attending: Cardiology | Admitting: Cardiology

## 2023-03-28 VITALS — BP 115/70 | HR 66 | Ht 75.0 in | Wt 232.0 lb

## 2023-03-28 DIAGNOSIS — I10 Essential (primary) hypertension: Secondary | ICD-10-CM

## 2023-03-28 DIAGNOSIS — I2510 Atherosclerotic heart disease of native coronary artery without angina pectoris: Secondary | ICD-10-CM

## 2023-03-28 DIAGNOSIS — E782 Mixed hyperlipidemia: Secondary | ICD-10-CM

## 2023-03-28 DIAGNOSIS — F419 Anxiety disorder, unspecified: Secondary | ICD-10-CM | POA: Diagnosis not present

## 2023-03-28 DIAGNOSIS — F32A Depression, unspecified: Secondary | ICD-10-CM

## 2023-03-28 MED ORDER — MELATONIN 1 MG PO CAPS: 1.0000 mg | ORAL_CAPSULE | Freq: Every day | ORAL | 3 refills | Status: DC

## 2023-03-28 NOTE — Patient Instructions (Signed)
.  Medication Instructions:  Your physician has recommended you make the following change in your medication:   START: Melatonin 1 mg at bedtime for sleep  *If you need a refill on your cardiac medications before your next appointment, please call your pharmacy*   Lab Work: None If you have labs (blood work) drawn today and your tests are completely normal, you will receive your results only by: MyChart Message (if you have MyChart) OR A paper copy in the mail If you have any lab test that is abnormal or we need to change your treatment, we will call you to review the results.   Testing/Procedures: None   Follow-Up: At [facility], you and your health needs are our priority.  As part of our continuing mission to provide you with exceptional heart care, we have created designated Provider Care Teams.  These Care Teams include your primary Cardiologist (physician) and Advanced Practice Providers (APPs -  Physician Assistants and Nurse Practitioners) who all work together to provide you with the care you need, when you need it.  We recommend signing up for the patient portal called "MyChart".  Sign up information is provided on this After Visit Summary.  MyChart is used to connect with patients for Virtual Visits (Telemedicine).  Patients are able to view lab/test results, encounter notes, upcoming appointments, etc.  Non-urgent messages can be sent to your provider as well.   To learn more about what you can do with MyChart, go to [email or web address].    Your next appointment:   Keep appointment already scheduled with Dr. Dulce Sellar  Provider:   Wallis Bamberg, NP Rosalita Levan)    Other Instructions Speak with PCP about antidepressant medication

## 2023-04-08 DIAGNOSIS — K117 Disturbances of salivary secretion: Secondary | ICD-10-CM

## 2023-04-08 HISTORY — DX: Disturbances of salivary secretion: K11.7

## 2023-04-17 ENCOUNTER — Telehealth: Payer: Self-pay

## 2023-04-17 ENCOUNTER — Ambulatory Visit: Payer: 59 | Attending: Cardiology

## 2023-04-17 NOTE — Progress Notes (Signed)
   Nurse Visit   Date of Encounter: 04/17/2023 ID: Lee Stevens, DOB 11-17-1961, MRN [id number]  PCP:  Olive Bass, MD   [facility] Providers Cardiologist:  Norman Herrlich, MD      Visit Details   VS:  BP 128/68 (BP Location: Right Arm, Patient Position: Sitting, Cuff Size: Normal)   Pulse 65   Wt 232 lb (105.2 kg)   BMI 29.00 kg/m  , BMI Body mass index is 29 kg/m.  Wt Readings from Last 3 Encounters:  04/17/23 232 lb (105.2 kg)  03/28/23 232 lb (105.2 kg)  02/19/23 231 lb (104.8 kg)     Reason for visit: Perform Vital Sign Check Performed today: Vitals, Provider consulted, Education Changes (medications, testing, etc.) : No new orders at this time Length of Visit: 20 minutes    Medications Adjustments/Labs and Tests Ordered: No orders of the defined types were placed in this encounter.  No orders of the defined types were placed in this encounter.    Signed, Samson Frederic, RN  04/17/2023 3:11 PM

## 2023-04-17 NOTE — Telephone Encounter (Signed)
This patient came into the office for a nurse visit to check his blood pressure manually versus what he was getting with his blood pressure device. When I took his blood pressure manually it was 140/80 HR 64. When the patient took his blood pressure with his blood pressure device, it was 128/68 HR 65.He states that he has already started taking his Benicar again. Patient would like to know if there will be any changes with his Benicar?

## 2023-04-18 NOTE — Telephone Encounter (Signed)
Patient scheduled for a one month follow up with JCW/kbl 04/18/23

## 2023-04-18 NOTE — Telephone Encounter (Signed)
Called patient and informed him of Lee Bamberg, NP's recommendation below:  "Please have him increase his Benicar to 40 mg daily. Lets have him come back in 1 month for visit please with me."  Patient was very hesitant about increasing his Benicar to 40 mg daily. He stated that he would like to continue monitoring his blood pressure for another week or so because his blood pressure has been coming down. He stated that if his blood pressure doesn't keep coming back down he would call us and start the Benicar. Patient had no further questions at this time.

## 2023-04-18 NOTE — Telephone Encounter (Signed)
Left message for the patient to call back.

## 2023-04-24 DIAGNOSIS — R310 Gross hematuria: Secondary | ICD-10-CM | POA: Insufficient documentation

## 2023-04-24 HISTORY — DX: Gross hematuria: R31.0

## 2023-04-25 DIAGNOSIS — N402 Nodular prostate without lower urinary tract symptoms: Secondary | ICD-10-CM

## 2023-04-25 HISTORY — DX: Nodular prostate without lower urinary tract symptoms: N40.2

## 2023-05-03 HISTORY — PX: TURBINATE REDUCTION: SHX6157

## 2023-05-07 ENCOUNTER — Telehealth: Payer: Self-pay | Admitting: Cardiology

## 2023-05-07 MED ORDER — OLMESARTAN MEDOXOMIL 40 MG PO TABS: 40.0000 mg | ORAL_TABLET | Freq: Every day | ORAL | 3 refills | Status: DC

## 2023-05-07 NOTE — Telephone Encounter (Signed)
Pt c/o medication issue:  1. Name of Medication: olmesartan (BENICAR) 20 MG tablet   2. How are you currently taking this medication (dosage and times per day)?   3. Are you having a reaction (difficulty breathing--STAT)?   4. What is your medication issue? Pharmacy calling stating patient has stated this medication is to be taken every day now and they would need a new prescription for the dosing change.

## 2023-05-09 DIAGNOSIS — R1031 Right lower quadrant pain: Secondary | ICD-10-CM | POA: Insufficient documentation

## 2023-05-09 HISTORY — DX: Right lower quadrant pain: R10.31

## 2023-05-11 DIAGNOSIS — D494 Neoplasm of unspecified behavior of bladder: Secondary | ICD-10-CM

## 2023-05-11 HISTORY — DX: Neoplasm of unspecified behavior of bladder: D49.4

## 2023-05-13 ENCOUNTER — Other Ambulatory Visit: Payer: Self-pay

## 2023-05-20 ENCOUNTER — Ambulatory Visit: Payer: 59 | Admitting: Cardiology

## 2023-06-06 ENCOUNTER — Ambulatory Visit: Payer: 59 | Admitting: Cardiology

## 2023-06-10 ENCOUNTER — Encounter (HOSPITAL_BASED_OUTPATIENT_CLINIC_OR_DEPARTMENT_OTHER): Payer: Self-pay | Admitting: Urology

## 2023-06-10 ENCOUNTER — Telehealth: Payer: Self-pay | Admitting: Cardiology

## 2023-06-10 ENCOUNTER — Other Ambulatory Visit: Payer: Self-pay | Admitting: Urology

## 2023-06-10 NOTE — Telephone Encounter (Signed)
   Patient Name: Lee Stevens  DOB: 02-23-1961 MRN: [id number]  Primary Cardiologist: Norman Herrlich, MD  Chart reviewed as part of pre-operative protocol coverage.   Per DAPT protocol patient can hold Plavix 5 days prior to procedure and should resume postprocedure when surgically safe and hemostasis is achieved.   Napoleon Form, Leodis Rains, NP 06/10/2023, 12:19 PM

## 2023-06-10 NOTE — Telephone Encounter (Signed)
   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 Leta Jungling Group or Practice Name:  [facility] Phone number:  [phone] Fax number:  [phone]   Type of Clearance Requested:   - Pharmacy:  Hold Clopidogrel (Plavix) Hold 5 days Prior   Type of Anesthesia:  General    Additional requests/questions:  Please advise surgeon/provider what medications should be held.  Signed, Belisicia T Woods   06/10/2023, 12:12 PM

## 2023-06-11 ENCOUNTER — Encounter (HOSPITAL_BASED_OUTPATIENT_CLINIC_OR_DEPARTMENT_OTHER): Payer: Self-pay | Admitting: Urology

## 2023-06-11 MED ORDER — OLMESARTAN MEDOXOMIL 20 MG PO TABS: 20.0000 mg | ORAL_TABLET | Freq: Two times a day (BID) | ORAL | 3 refills | Status: DC

## 2023-06-11 NOTE — Progress Notes (Addendum)
Spoke w/ via phone for pre-op interview--- pt Lab needs dos----  Mirant results------ current EKG in epic/ chart COVID test -----patient states asymptomatic no test needed Arrive at ------- 1030 on 06-26-2023  NPO after MN NO Solid Food.  Clear liquids from MN until--- 0930 Med rec completed Medications to take morning of surgery ----- toprol, synthroid, if needed may take xanax and do nasal spray Diabetic medication ----- n/a Patient instructed no nail polish to be worn day of surgery Patient instructed to bring photo id and insurance card day of surgery Patient aware to have Driver (ride ) / caregiver    for 24 hours after surgery -0- wife, kim Patient Special Instructions ----- n/a Pre-Op special Instructions ----- pt has tele cardiac clearance for plavix by Robin Searing NP on 06-10-2023 in epic/ chart.  Pt stated his last dose plavix 04-23-2023 due to hematuria.  Asked pt if cardiology was aware when he spoke with them yesterday on the phone, pt stated yes he told them.  Pt stated he is taking plavix instead of asa due to CKD 3. Patient verbalized understanding of instructions that were given at this phone interview. Patient denies shortness of breath, chest pain, fever, cough at this phone interview.   Anesthesia Review:  HTN;  chronic atypical chest pain (pt denies any symptoms for several months);  mild nonob CAD;  CKD 3  PCP: Dr Lorenso Courier Cardiologist : Dr Dulce Sellar 02-19-2023/  Wallis Bamberg NP 03-28-2023 Chest x-ray : CT 06-05-2019 EKG : 03-28-2023 Echo : no Stress test:  stress echo in care everywhere dated 03-27-2026 unable to view  per dr Dulce Sellar note normal Coronary CT FFR:  11-14-2018 Cardiac Cath :  08-09-2022 Activity level: denies sob w/ any activity Sleep Study/ CPAP : no Blood Thinner/ Instructions /Last Dose: Plavix/  last dose 04-23-2023 (see above) ASA / Instructions/ Last Dose : no

## 2023-06-25 NOTE — Anesthesia Preprocedure Evaluation (Signed)
Anesthesia Evaluation  Patient identified by MRN, date of birth, ID band Patient awake    Reviewed: Allergy & Precautions, NPO status , Patient's Chart, lab work & pertinent test results  Airway Mallampati: II  TM Distance: >3 FB Neck ROM: Full    Dental no notable dental hx. (+) Teeth Intact, Dental Advisory Given   Pulmonary sleep apnea    Pulmonary exam normal breath sounds clear to auscultation       Cardiovascular hypertension, Pt. on medications and Pt. on home beta blockers + angina  + CAD  Normal cardiovascular exam Rhythm:Regular Rate:Normal     Neuro/Psych  PSYCHIATRIC DISORDERS Anxiety        GI/Hepatic ,GERD  Medicated and Controlled,,  Endo/Other  Hypothyroidism    Renal/GU Renal disease Bladder dysfunction: Rosuvastatin.  Bladder Tumor    Musculoskeletal   Abdominal   Peds  Hematology   Anesthesia Other Findings ALL: Keflex, hydrochlorothiazide, Rosuvastatin  Reproductive/Obstetrics                             Anesthesia Physical Anesthesia Plan  ASA: 3  Anesthesia Plan: General   Post-op Pain Management: Toradol IV (intra-op)*, Ofirmev IV (intra-op)* and Precedex   Induction: Intravenous  PONV Risk Score and Plan: 3 and Treatment may vary due to age or medical condition, Midazolam, Ondansetron and Dexamethasone  Airway Management Planned: LMA  Additional Equipment: None  Intra-op Plan:   Post-operative Plan: Extubation in OR  Informed Consent: I have reviewed the patients History and Physical, chart, labs and discussed the procedure including the risks, benefits and alternatives for the proposed anesthesia with the patient or authorized representative who has indicated his/her understanding and acceptance.     Dental advisory given  Plan Discussed with: CRNA  Anesthesia Plan Comments:         Anesthesia Quick Evaluation

## 2023-06-26 ENCOUNTER — Encounter (HOSPITAL_BASED_OUTPATIENT_CLINIC_OR_DEPARTMENT_OTHER): Payer: Self-pay | Admitting: Urology

## 2023-06-26 ENCOUNTER — Other Ambulatory Visit: Payer: Self-pay

## 2023-06-26 ENCOUNTER — Ambulatory Visit (HOSPITAL_BASED_OUTPATIENT_CLINIC_OR_DEPARTMENT_OTHER)
Admission: RE | Admit: 2023-06-26 | Discharge: 2023-06-26 | Disposition: A | Discharge status: Home / Self Care | Payer: 59 | Attending: Urology | Admitting: Urology

## 2023-06-26 ENCOUNTER — Ambulatory Visit (HOSPITAL_BASED_OUTPATIENT_CLINIC_OR_DEPARTMENT_OTHER): Payer: 59 | Admitting: Anesthesiology

## 2023-06-26 ENCOUNTER — Encounter (HOSPITAL_BASED_OUTPATIENT_CLINIC_OR_DEPARTMENT_OTHER)
Admission: RE | Disposition: A | Discharge status: Home / Self Care | Payer: Self-pay | Source: Home / Self Care | Attending: Urology

## 2023-06-26 DIAGNOSIS — Z7902 Long term (current) use of antithrombotics/antiplatelets: Secondary | ICD-10-CM | POA: Diagnosis not present

## 2023-06-26 DIAGNOSIS — N183 Chronic kidney disease, stage 3 unspecified: Secondary | ICD-10-CM

## 2023-06-26 DIAGNOSIS — Z87891 Personal history of nicotine dependence: Secondary | ICD-10-CM | POA: Insufficient documentation

## 2023-06-26 DIAGNOSIS — Z9049 Acquired absence of other specified parts of digestive tract: Secondary | ICD-10-CM | POA: Insufficient documentation

## 2023-06-26 DIAGNOSIS — I2510 Atherosclerotic heart disease of native coronary artery without angina pectoris: Secondary | ICD-10-CM | POA: Diagnosis not present

## 2023-06-26 DIAGNOSIS — Z87442 Personal history of urinary calculi: Secondary | ICD-10-CM | POA: Insufficient documentation

## 2023-06-26 DIAGNOSIS — F419 Anxiety disorder, unspecified: Secondary | ICD-10-CM | POA: Diagnosis not present

## 2023-06-26 DIAGNOSIS — C673 Malignant neoplasm of anterior wall of bladder: Secondary | ICD-10-CM | POA: Diagnosis not present

## 2023-06-26 DIAGNOSIS — E038 Other specified hypothyroidism: Secondary | ICD-10-CM | POA: Diagnosis not present

## 2023-06-26 DIAGNOSIS — Z7901 Long term (current) use of anticoagulants: Secondary | ICD-10-CM | POA: Diagnosis not present

## 2023-06-26 DIAGNOSIS — I129 Hypertensive chronic kidney disease with stage 1 through stage 4 chronic kidney disease, or unspecified chronic kidney disease: Secondary | ICD-10-CM

## 2023-06-26 DIAGNOSIS — R7303 Prediabetes: Secondary | ICD-10-CM | POA: Diagnosis not present

## 2023-06-26 DIAGNOSIS — C672 Malignant neoplasm of lateral wall of bladder: Secondary | ICD-10-CM | POA: Insufficient documentation

## 2023-06-26 DIAGNOSIS — Z79899 Other long term (current) drug therapy: Secondary | ICD-10-CM | POA: Diagnosis not present

## 2023-06-26 DIAGNOSIS — Z79890 Hormone replacement therapy (postmenopausal): Secondary | ICD-10-CM | POA: Diagnosis not present

## 2023-06-26 DIAGNOSIS — Z01818 Encounter for other preprocedural examination: Secondary | ICD-10-CM

## 2023-06-26 DIAGNOSIS — C679 Malignant neoplasm of bladder, unspecified: Secondary | ICD-10-CM

## 2023-06-26 DIAGNOSIS — G4730 Sleep apnea, unspecified: Secondary | ICD-10-CM | POA: Diagnosis not present

## 2023-06-26 DIAGNOSIS — E782 Mixed hyperlipidemia: Secondary | ICD-10-CM | POA: Diagnosis not present

## 2023-06-26 DIAGNOSIS — K219 Gastro-esophageal reflux disease without esophagitis: Secondary | ICD-10-CM | POA: Insufficient documentation

## 2023-06-26 HISTORY — DX: Long term (current) use of anticoagulants: Z79.01

## 2023-06-26 HISTORY — DX: Benign prostatic hyperplasia with lower urinary tract symptoms: N40.1

## 2023-06-26 HISTORY — DX: Personal history of other diseases of the digestive system: Z87.19

## 2023-06-26 HISTORY — DX: Personal history of urinary calculi: Z87.442

## 2023-06-26 HISTORY — DX: Personal history of other diseases of the musculoskeletal system and connective tissue: Z87.39

## 2023-06-26 HISTORY — DX: Presence of spectacles and contact lenses: Z97.3

## 2023-06-26 HISTORY — PX: TRANSURETHRAL RESECTION OF BLADDER TUMOR WITH MITOMYCIN-C: SHX6459

## 2023-06-26 LAB — POCT I-STAT, CHEM 8
BUN: 15 mg/dL (ref 6–20)
Calcium, Ion: 1.11 mmol/L — ABNORMAL LOW (ref 1.15–1.40)
Chloride: 107 mmol/L (ref 98–111)
Creatinine, Ser: 1.4 mg/dL — ABNORMAL HIGH (ref 0.61–1.24)
Glucose, Bld: 101 mg/dL — ABNORMAL HIGH (ref 70–99)
HCT: 44 % (ref 39.0–52.0)
Hemoglobin: 15 g/dL (ref 13.0–17.0)
Potassium: 4.5 mmol/L (ref 3.5–5.1)
Sodium: 137 mmol/L (ref 135–145)
TCO2: 20 mmol/L — ABNORMAL LOW (ref 22–32)

## 2023-06-26 SURGERY — TRANSURETHRAL RESECTION OF BLADDER TUMOR WITH MITOMYCIN-C
Anesthesia: General | Site: Bladder

## 2023-06-26 MED ORDER — AMISULPRIDE (ANTIEMETIC) 5 MG/2ML IV SOLN: 10.0000 mg | Freq: Once | INTRAVENOUS | Status: DC | PRN

## 2023-06-26 MED ORDER — TRAMADOL HCL 50 MG PO TABS: 50.0000 mg | ORAL_TABLET | Freq: Four times a day (QID) | ORAL | 0 refills | Status: AC | PRN

## 2023-06-26 MED ORDER — ONDANSETRON HCL 4 MG/2ML IJ SOLN: 4.0000 mg | Freq: Once | INTRAMUSCULAR | Status: DC | PRN

## 2023-06-26 MED ORDER — DEXAMETHASONE SODIUM PHOSPHATE 10 MG/ML IJ SOLN
INTRAMUSCULAR | Status: AC
  Filled 2023-06-26: qty 2

## 2023-06-26 MED ORDER — OXYCODONE HCL 5 MG PO TABS: 5.0000 mg | ORAL_TABLET | Freq: Once | ORAL | Status: DC | PRN

## 2023-06-26 MED ORDER — CIPROFLOXACIN IN D5W 400 MG/200ML IV SOLN
400.0000 mg | INTRAVENOUS | Status: AC
  Administered 2023-06-26: 400 mg via INTRAVENOUS

## 2023-06-26 MED ORDER — LIDOCAINE 2% (20 MG/ML) 5 ML SYRINGE
INTRAMUSCULAR | Status: DC | PRN
  Administered 2023-06-26: 100 mg via INTRAVENOUS

## 2023-06-26 MED ORDER — DEXAMETHASONE SODIUM PHOSPHATE 10 MG/ML IJ SOLN
INTRAMUSCULAR | Status: AC
  Filled 2023-06-26: qty 1

## 2023-06-26 MED ORDER — PROPOFOL 10 MG/ML IV BOLUS
INTRAVENOUS | Status: AC
  Filled 2023-06-26: qty 20

## 2023-06-26 MED ORDER — ACETAMINOPHEN 10 MG/ML IV SOLN
INTRAVENOUS | Status: AC
  Filled 2023-06-26: qty 100

## 2023-06-26 MED ORDER — ACETAMINOPHEN 10 MG/ML IV SOLN: 1000.0000 mg | Freq: Once | INTRAVENOUS | Status: DC | PRN

## 2023-06-26 MED ORDER — DEXAMETHASONE SODIUM PHOSPHATE 10 MG/ML IJ SOLN
INTRAMUSCULAR | Status: DC | PRN
  Administered 2023-06-26: 10 mg via INTRAVENOUS

## 2023-06-26 MED ORDER — SODIUM CHLORIDE 0.9 % IR SOLN
Status: DC | PRN
  Administered 2023-06-26: 3000 mL via INTRAVESICAL

## 2023-06-26 MED ORDER — FENTANYL CITRATE (PF) 250 MCG/5ML IJ SOLN
INTRAMUSCULAR | Status: DC | PRN
  Administered 2023-06-26: 100 ug via INTRAVENOUS

## 2023-06-26 MED ORDER — DEXMEDETOMIDINE HCL IN NACL 80 MCG/20ML IV SOLN
INTRAVENOUS | Status: DC | PRN
  Administered 2023-06-26: 8 ug via INTRAVENOUS

## 2023-06-26 MED ORDER — ONDANSETRON HCL 4 MG/2ML IJ SOLN
INTRAMUSCULAR | Status: AC
  Filled 2023-06-26: qty 2

## 2023-06-26 MED ORDER — KETOROLAC TROMETHAMINE 30 MG/ML IJ SOLN
INTRAMUSCULAR | Status: DC | PRN
  Administered 2023-06-26: 30 mg via INTRAVENOUS

## 2023-06-26 MED ORDER — CIPROFLOXACIN IN D5W 400 MG/200ML IV SOLN
INTRAVENOUS | Status: AC
  Filled 2023-06-26: qty 200

## 2023-06-26 MED ORDER — ACETAMINOPHEN 10 MG/ML IV SOLN
INTRAVENOUS | Status: DC | PRN
  Administered 2023-06-26: 1000 mg via INTRAVENOUS

## 2023-06-26 MED ORDER — GEMCITABINE CHEMO FOR BLADDER INSTILLATION 2000 MG
2000.0000 mg | Freq: Once | INTRAVENOUS | Status: AC
  Administered 2023-06-26: 2000 mg via INTRAVESICAL
  Filled 2023-06-26: qty 2000

## 2023-06-26 MED ORDER — KETOROLAC TROMETHAMINE 30 MG/ML IJ SOLN
INTRAMUSCULAR | Status: AC
  Filled 2023-06-26: qty 1

## 2023-06-26 MED ORDER — ONDANSETRON HCL 4 MG/2ML IJ SOLN
INTRAMUSCULAR | Status: DC | PRN
  Administered 2023-06-26: 4 mg via INTRAVENOUS

## 2023-06-26 MED ORDER — ROCURONIUM BROMIDE 10 MG/ML (PF) SYRINGE
PREFILLED_SYRINGE | INTRAVENOUS | Status: AC
  Filled 2023-06-26: qty 10

## 2023-06-26 MED ORDER — SODIUM CHLORIDE 0.9 % IV SOLN: INTRAVENOUS | Status: DC

## 2023-06-26 MED ORDER — PROPOFOL 10 MG/ML IV BOLUS
INTRAVENOUS | Status: DC | PRN
Start: 2023-06-26 — End: 2023-06-26
  Administered 2023-06-26: 200 mg via INTRAVENOUS

## 2023-06-26 MED ORDER — LIDOCAINE HCL (PF) 2 % IJ SOLN
INTRAMUSCULAR | Status: AC
  Filled 2023-06-26: qty 10

## 2023-06-26 MED ORDER — SULFAMETHOXAZOLE-TRIMETHOPRIM 800-160 MG PO TABS: 1.0000 | ORAL_TABLET | Freq: Two times a day (BID) | ORAL | 0 refills | Status: DC

## 2023-06-26 MED ORDER — MIDAZOLAM HCL 2 MG/2ML IJ SOLN
INTRAMUSCULAR | Status: AC
  Filled 2023-06-26: qty 2

## 2023-06-26 MED ORDER — FENTANYL CITRATE (PF) 100 MCG/2ML IJ SOLN
INTRAMUSCULAR | Status: AC
  Filled 2023-06-26: qty 2

## 2023-06-26 MED ORDER — LIDOCAINE HCL (PF) 2 % IJ SOLN
INTRAMUSCULAR | Status: AC
  Filled 2023-06-26: qty 5

## 2023-06-26 MED ORDER — HYDROMORPHONE HCL 1 MG/ML IJ SOLN
INTRAMUSCULAR | Status: AC
  Filled 2023-06-26: qty 1

## 2023-06-26 MED ORDER — ROCURONIUM BROMIDE 10 MG/ML (PF) SYRINGE
PREFILLED_SYRINGE | INTRAVENOUS | Status: DC | PRN
  Administered 2023-06-26: 60 mg via INTRAVENOUS

## 2023-06-26 MED ORDER — EPHEDRINE SULFATE-NACL 50-0.9 MG/10ML-% IV SOSY
PREFILLED_SYRINGE | INTRAVENOUS | Status: DC | PRN
  Administered 2023-06-26: 5 mg via INTRAVENOUS

## 2023-06-26 MED ORDER — HYDROMORPHONE HCL 1 MG/ML IJ SOLN
0.2500 mg | INTRAMUSCULAR | Status: DC | PRN
  Administered 2023-06-26 (×2): 0.25 mg via INTRAVENOUS

## 2023-06-26 MED ORDER — OXYCODONE HCL 5 MG/5ML PO SOLN: 5.0000 mg | Freq: Once | ORAL | Status: DC | PRN

## 2023-06-26 MED ORDER — MIDAZOLAM HCL 2 MG/2ML IJ SOLN
INTRAMUSCULAR | Status: DC | PRN
  Administered 2023-06-26: 2 mg via INTRAVENOUS

## 2023-06-26 MED ORDER — ROCURONIUM BROMIDE 10 MG/ML (PF) SYRINGE
PREFILLED_SYRINGE | INTRAVENOUS | Status: AC
  Filled 2023-06-26: qty 20

## 2023-06-26 MED ORDER — SUGAMMADEX SODIUM 200 MG/2ML IV SOLN
INTRAVENOUS | Status: DC | PRN
  Administered 2023-06-26: 400 mg via INTRAVENOUS

## 2023-06-26 MED ORDER — HYOSCYAMINE SULFATE 0.125 MG SL SUBL: 0.1250 mg | SUBLINGUAL_TABLET | SUBLINGUAL | 0 refills | Status: DC | PRN

## 2023-06-26 MED ORDER — ONDANSETRON HCL 4 MG/2ML IJ SOLN
INTRAMUSCULAR | Status: AC
  Filled 2023-06-26: qty 4

## 2023-06-26 SURGICAL SUPPLY — 24 items
APL SKNCLS STERI-STRIP NONHPOA (GAUZE/BANDAGES/DRESSINGS)
BAG DRN RND TRDRP ANRFLXCHMBR (UROLOGICAL SUPPLIES) ×1
BAG URINE DRAIN 2000ML AR STRL (UROLOGICAL SUPPLIES) IMPLANT
BAG URO CATCHER STRL LF (MISCELLANEOUS) ×1 IMPLANT
BENZOIN TINCTURE PRP APPL 2/3 (GAUZE/BANDAGES/DRESSINGS) IMPLANT
DRAPE FOOT SWITCH (DRAPES) ×1 IMPLANT
ELECT REM PT RETURN 15FT ADLT (MISCELLANEOUS) ×1 IMPLANT
GLOVE BIO SURGEON STRL SZ7 (GLOVE) ×1 IMPLANT
GOWN STRL REUS W/ TWL LRG LVL3 (GOWN DISPOSABLE) ×1 IMPLANT
GOWN STRL REUS W/TWL LRG LVL3 (GOWN DISPOSABLE) ×1
HOLDER FOLEY CATH W/STRAP (MISCELLANEOUS) IMPLANT
IV NS IRRIG 3000ML ARTHROMATIC (IV SOLUTION) IMPLANT
KIT TURNOVER CYSTO (KITS) IMPLANT
LOOP CUT BIPOLAR 24F LRG (ELECTROSURGICAL) IMPLANT
MANIFOLD NEPTUNE II (INSTRUMENTS) ×1 IMPLANT
NS IRRIG 500ML POUR BTL (IV SOLUTION) IMPLANT
PACK CYSTO (CUSTOM PROCEDURE TRAY) ×1 IMPLANT
PENCIL SMOKE EVACUATOR (MISCELLANEOUS) IMPLANT
PLUG CATH AND CAP STRL 200 (CATHETERS) IMPLANT
SLEEVE SCD COMPRESS KNEE MED (STOCKING) ×1 IMPLANT
SYR TOOMEY IRRIG 70ML (MISCELLANEOUS)
SYRINGE TOOMEY IRRIG 70ML (MISCELLANEOUS) IMPLANT
TUBING CONNECTING 10 (TUBING) IMPLANT
TUBING UROLOGY SET (TUBING) ×1 IMPLANT

## 2023-06-26 NOTE — H&P (Signed)
H&P  History of Present Illness: Lee Stevens is a 62 y.o. year old M who presents today for TURBT. Cysto as an outpatient revealed 2 papillary tumors. He had a contrasted CT scan which showed no upper tract lesions  Past Medical History:  Diagnosis Date   Allergic rhinitis    Anticoagulant long-term use    plavix---   managed by cardiology   Anxiety disorder 2018   Atypical chest pain 09/25/2018   followed by cardiology-- dr Dulce Sellar;  continue's to monitor for long history per dr Dulce Sellar note pt had cardiac cath in 2008 w/ luminal irregularity;  normal stress echo 04/ 2017;   Benign hypertension 01/19/2016   Bladder tumor 05/2023   BPH associated with nocturia    CAD in native artery 12/01/2018   cardiologist--- dr Dulce Sellar;   coronary CT FFR/ morph 11-14-2018  calcium score=514 involving RCA/ LAD/ PDA/ PL;   cardiac cath 08-09-2022  nonob CAD mild LAD 40%   Chronic kidney disease, stage 3 (HCC) 04/19/2017   folllowed by pcp   ETD (Eustachian tube dysfunction), right    GERD (gastroesophageal reflux disease)    History of anal fissures 04/10/2016   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 Christell Constant (note in care everywhere )---  06/ 2024 parotid right greater than left ,  resolved without intervention   Pre-diabetes    Prostate nodule 04/25/2023   Right lower quadrant abdominal pain 05/09/2023   Subclinical hypothyroidism    Tinnitus of both ears 11/26/2018   Wears glasses     Past Surgical History:  Procedure Laterality Date   ANAL FISSURE REPAIR  07/06/2016   @[facility]  BY DR Alveda Reasons;   ENDORECTAL ADVANCEMENT  FLAP 07-06-2016  AND RE-DO 10-09-2016   CARDIAC CATHETERIZATION  2008   per pt's cardiologist-- dr Dulce Sellar-- note , luminal irregurilities   CHOLECYSTECTOMY, LAPAROSCOPIC  2008   COLONOSCOPY  2016   COLOSTOMY TAKEDOWN  06/02/2018   @[facility] ;   ILEOSTOMY TAKEDOWN W/ ANORECTAL EUA   EXPLORATORY LAPAROTOMY W/ BOWEL RESECTION  06/28/2017   @[facility] ;   SMALL BOWEL RESECTION W/ ILEOSTOMY REVISION   EXTRACORPOREAL SHOCK WAVE LITHOTRIPSY  2006   LAPAROSCOPIC APPENDECTOMY  2000   LAPAROSCOPIC LOOP COLOSTOMY  06/07/2017   @[facility]  BY DR S. FULLER;   LOOP ILEOSTOMY CREATION   LEFT HEART CATH AND CORONARY ANGIOGRAPHY N/A 08/09/2022   Procedure: LEFT HEART CATH AND CORONARY ANGIOGRAPHY;  Surgeon: Corky Crafts, MD;  Location: [facility];  Service: Cardiovascular;  Laterality: N/A;   PLACEMENT OF SETON     @[facility] ;   08-29-2016  AND 11-05-2016   RECTAL EXAM UNDER ANESTHESIA  03/07/2018   @[facility]    TONSILLECTOMY AND ADENOIDECTOMY     child   TOTAL HIP ARTHROPLASTY Bilateral 2011   6 months apart same year   TURBINATE REDUCTION  05/03/2023   @[facility]  by dr d. Christell Constant;   BILATERAL INFERIOR    Home Medications:  Current Meds  Medication Sig   ALPRAZolam (XANAX) 0.25 MG tablet Take 0.25 mg by mouth as needed for anxiety.  azelastine (ASTELIN) 0.1 % nasal spray Place into both nostrils 2 (two) times daily as needed for rhinitis. Use in each nostril as directed   Evolocumab (REPATHA SURECLICK) 140 MG/ML SOAJ Inject 140 mg into the skin every 14 (fourteen) days. (Patient taking differently: Inject 1 mL into the skin every 14 (fourteen) days.)   lansoprazole (PREVACID) 15 MG capsule Take 15 mg by mouth at bedtime.   levothyroxine (SYNTHROID) 75 MCG tablet Take 75 mcg by mouth every morning.   metoprolol succinate (TOPROL-XL) 25 MG 24 hr tablet TAKE 1 TABLET BY MOUTH ONCE DAILY (Patient taking differently: Take 25 mg by mouth daily. TAKE 1 TABLET BY MOUTH ONCE DAILY)   Multiple Vitamin (MULTIVITAMIN)  tablet Take 1 tablet by mouth daily.   olmesartan (BENICAR) 20 MG tablet Take 1 tablet (20 mg total) by mouth 2 (two) times daily. (Patient taking differently: Take 20 mg by mouth daily.)    Allergies:  Allergies  Allergen Reactions   Keflex [Cephalexin] Anaphylaxis and Rash   Hydrochlorothiazide     Joint Pain.    Rosuvastatin     Myalgias (intolerance)      Family History  Problem Relation Age of Onset   Hypertension Mother    Heart attack Father    Hyperlipidemia Father     Social History:  reports that he has never smoked. He quit smokeless tobacco use about 20 years ago.  His smokeless tobacco use included snuff. He reports that he does not currently use alcohol. He reports that he does not use drugs.  ROS: A complete review of systems was performed.  All systems are negative except for pertinent findings as noted.  Physical Exam:  Vital signs in last 24 hours: Temp:  [97.6 F (36.4 C)] 97.6 F (36.4 C) (07/17 1044) Pulse Rate:  [61] 61 (07/17 1044) Resp:  [17] 17 (07/17 1044) BP: (145)/(86) 145/86 (07/17 1044) SpO2:  [97 %] 97 % (07/17 1044) Weight:  [98.7 kg] 98.7 kg (07/17 1044) Constitutional:  Alert and oriented, No acute distress Cardiovascular: Regular rate and rhythm Respiratory: Normal respiratory effort, Lungs clear bilaterally GI: Abdomen is soft, nontender, nondistended, no abdominal masses Lymphatic: No lymphadenopathy Neurologic: Grossly intact, no focal deficits Psychiatric: Normal mood and affect   Laboratory Data:  Recent Labs    06/26/23 1126  HGB 15.0  HCT 44.0    Recent Labs    06/26/23 1126  NA 137  K 4.5  CL 107  GLUCOSE 101*  BUN 15  CREATININE 1.40*     Results for orders placed or performed during the hospital encounter of 06/26/23 (from the past 24 hour(s))  I-STAT, chem 8     Status: Abnormal   Collection Time: 06/26/23 11:26 AM  Result Value Ref Range   Sodium 137 135 - 145 mmol/L   Potassium 4.5 3.5 - 5.1 mmol/L    Chloride 107 98 - 111 mmol/L   BUN 15 6 - 20 mg/dL   Creatinine, Ser 7.84 (H) 0.61 - 1.24 mg/dL   Glucose, Bld 696 (H) 70 - 99 mg/dL   Calcium, Ion 2.95 (L) 1.15 - 1.40 mmol/L   TCO2 20 (L) 22 - 32 mmol/L   Hemoglobin 15.0 13.0 - 17.0 g/dL   HCT 28.4 13.2 - 44.0 %   No results found for this or any previous visit (from the past 240 hour(s)).  Renal Function: Recent Labs    06/26/23 1126  CREATININE 1.40*   Estimated Creatinine Clearance: 67.1 mL/min (A) (by  C-G formula based on SCr of 1.4 mg/dL (H)).  Radiologic Imaging: No results found.  Assessment:  Lee Stevens is a 62 y.o. year old M who presents today for TURBT for bladder tumors, possible instillation of gemcitabine  Plan:  --to OR as planned. Procedure and risks reviewed including hematuria, clot retention, infection, damage to bladder requiring prolonged catheterization or operative repair, need for future procedures or recurrence of disease.  Irine Seal, MD 06/26/2023, 11:37 AM  [facility] Pager: [phone]

## 2023-06-26 NOTE — Anesthesia Postprocedure Evaluation (Signed)
Anesthesia Post Note  Patient: Lee Stevens  Procedure(s) Performed: TRANSURETHRAL RESECTION OF BLADDER TUMOR WITH INTRAOPERATIVE INSTILLATION OF GEMCITABINE (Bladder)     Patient location during evaluation: PACU Anesthesia Type: General Level of consciousness: awake and alert Pain management: pain level controlled Vital Signs Assessment: post-procedure vital signs reviewed and stable Respiratory status: spontaneous breathing, nonlabored ventilation, respiratory function stable and patient connected to nasal cannula oxygen Cardiovascular status: blood pressure returned to baseline and stable Postop Assessment: no apparent nausea or vomiting Anesthetic complications: no   No notable events documented.  Last Vitals:  Vitals:   06/26/23 1430 06/26/23 1515  BP: 132/85 131/78  Pulse: (!) 55 (!) 53  Resp: 13 14  Temp:  36.4 C  SpO2: 100% 98%    Last Pain:  Vitals:   06/26/23 1515  TempSrc:   PainSc: 0-No pain                 Trevor Iha

## 2023-06-26 NOTE — Anesthesia Procedure Notes (Signed)
Procedure Name: Intubation Date/Time: 06/26/2023 12:15 PM  Performed by: Dairl Ponder, CRNAPre-anesthesia Checklist: Patient identified, Emergency Drugs available, Suction available and Patient being monitored Patient Re-evaluated:Patient Re-evaluated prior to induction Oxygen Delivery Method: Circle System Utilized Preoxygenation: Pre-oxygenation with 100% oxygen Induction Type: IV induction Ventilation: Mask ventilation without difficulty Laryngoscope Size: Mac and 4 Grade View: Grade I Tube type: Oral Tube size: 7.5 mm Number of attempts: 1 Airway Equipment and Method: Stylet and Oral airway Placement Confirmation: ETT inserted through vocal cords under direct vision, positive ETCO2 and breath sounds checked- equal and bilateral Secured at: 25 cm Tube secured with: Tape Dental Injury: Teeth and Oropharynx as per pre-operative assessment

## 2023-06-26 NOTE — Discharge Instructions (Addendum)
Transurethral Resection of Bladder Tumor (TURBT) or Bladder Biopsy   Definition:  Transurethral Resection of the Bladder Tumor is a surgical procedure used to diagnose and remove tumors within the bladder. TURBT is the most common treatment for early stage bladder cancer.  General instructions:     Your recent bladder surgery requires very little post hospital care but some definite precautions.  Despite the fact that no skin incisions were used, the area around the bladder incisions are raw and covered with scabs to promote healing and prevent bleeding. Certain precautions are needed to insure that the scabs are not disturbed over the next 2-4 weeks while the healing proceeds.  Because the raw surface inside your bladder and the irritating effects of urine you may expect frequency of urination and/or urgency (a stronger desire to urinate) and perhaps even getting up at night more often. This will usually resolve or improve slowly over the healing period. You may see some blood in your urine over the first 6 weeks. Do not be alarmed, even if the urine was clear for a while. Get off your feet and drink lots of fluids until clearing occurs. If you start to pass clots or don't improve call us.  Diet:  You may return to your normal diet immediately. Because of the raw surface of your bladder, alcohol, spicy foods, foods high in acid and drinks with caffeine may cause irritation or frequency and should be used in moderation. To keep your urine flowing freely and avoid constipation, drink plenty of fluids during the day (8-10 glasses). Tip: Avoid cranberry juice because it is very acidic.  Activity:  Your physical activity doesn't need to be restricted. However, if you are very active, you may see some blood in the urine. We suggest that you reduce your activity under the circumstances until the bleeding has stopped.  Bowels:  It is important to keep your bowels regular during the postoperative  period. Straining with bowel movements can cause bleeding. A bowel movement every other day is reasonable. Use a mild laxative if needed, such as milk of magnesia 2-3 tablespoons, or 2 Dulcolax tablets. Call if you continue to have problems. If you had been taking narcotics for pain, before, during or after your surgery, you may be constipated. Take a laxative if necessary.    Medication:  You should resume your pre-surgery medications unless told not to. In addition you may be given an antibiotic to prevent or treat infection. Antibiotics are not always necessary. All medication should be taken as prescribed until the bottles are finished unless you are having an unusual reaction to one of the drugs.     No acetaminophen/Tylenol until after 6:30pm today if needed for pain.    No ibuprofen, Advil, Aleve, Motrin, ketorolac, meloxicam, naproxen, or other NSAIDS until after 6:50pm today if needed for pain.      Post Anesthesia Home Care Instructions  Activity: Get plenty of rest for the remainder of the day. A responsible individual must stay with you for 24 hours following the procedure.  For the next 24 hours, DO NOT: -Drive a car -Advertising copywriter -Drink alcoholic beverages -Take any medication unless instructed by your physician -Make any legal decisions or sign important papers.  Meals: Start with liquid foods such as gelatin or soup. Progress to regular foods as tolerated. Avoid greasy, spicy, heavy foods. If nausea and/or vomiting occur, drink only clear liquids until the nausea and/or vomiting subsides. Call your physician if vomiting continues.  Special  Instructions/Symptoms: Your throat may feel dry or sore from the anesthesia or the breathing tube placed in your throat during surgery. If this causes discomfort, gargle with warm salt water. The discomfort should disappear within 24 hours.

## 2023-06-26 NOTE — Op Note (Signed)
Preoperative diagnosis: Bladder tumor (2.5 cm)  Postoperative diagnosis:  Bladder tumor (2.5 cm)  Procedure:  Cystoscopy Transurethral resection of bladder tumor (left lateral/anterior) Instillation of gemcitabine   Surgeon: Irine Seal M.D.  Anesthesia: General  Complications: None  Intraoperative findings:  Bladder tumor: left lateral/anterior  EBL: Minimal  Specimens: Bladder tumor - left lateral/anterior  Disposition of specimens: Pathology  Indication: Lee Stevens is a patient who was found to have a bladder tumor. After reviewing the management options for treatment, he elected to proceed with the above surgical procedure(s). We have discussed the potential benefits and risks of the procedure, side effects of the proposed treatment, the likelihood of the patient achieving the goals of the procedure, and any potential problems that might occur during the procedure or recuperation. Informed consent has been obtained.  Description of procedure:  The patient was taken to the operating room and general anesthesia was induced.  The patient was placed in the dorsal lithotomy position, prepped and draped in the usual sterile fashion, and preoperative antibiotics were administered. A preoperative time-out was performed.   Cystourethroscopy was performed.  The patient's urethra was examined and wasdemonstrated mild bilobar prostatic hypertrophy  The bladder was then systematically examined in its entirety with the 30 and 70 degree lenses. The above tumor was identified. There were no other suspicious lesions  The bladder was then re-examined after the resectoscope was placed.  The bladder tumor was 2.5 cm.  It was located on the left lateral/anterior bladder wall and appeared papillary with some dystrophic calcifications. Using loop cautery resection, the entire tumor was resected and removed for permanent pathologic analysis.   Hemostasis was then achieved with the loop cautery  and the bladder was emptied and reinspected with no further bleeding noted at the end of the procedure.    An 18 fr foley was placed with return of clear urine. The patient was awakened and brought to the pacu were gemcitabine was inserted via the catheter, which was then clamped. This was held in the bladder for an hour.   Despina Arias MD

## 2023-06-26 NOTE — Transfer of Care (Signed)
Immediate Anesthesia Transfer of Care Note  Patient: Lee Stevens  Procedure(s) Performed: TRANSURETHRAL RESECTION OF BLADDER TUMOR WITH INTRAOPERATIVE INSTILLATION OF GEMCITABINE (Bladder)  Patient Location: PACU  Anesthesia Type:General  Level of Consciousness: drowsy and patient cooperative  Airway & Oxygen Therapy: Patient Spontanous Breathing and Patient connected to nasal cannula oxygen  Post-op Assessment: Report given to RN and Post -op Vital signs reviewed and stable  Post vital signs: Reviewed and stable  Last Vitals:  Vitals Value Taken Time  BP 141/76 06/26/23 1315  Temp    Pulse 63 06/26/23 1316  Resp 12 06/26/23 1316  SpO2 98 % 06/26/23 1316  Vitals shown include unfiled device data.  Last Pain:  Vitals:   06/26/23 1044  TempSrc: Oral         Complications: No notable events documented.

## 2023-06-27 LAB — SURGICAL PATHOLOGY

## 2023-07-01 ENCOUNTER — Encounter (HOSPITAL_BASED_OUTPATIENT_CLINIC_OR_DEPARTMENT_OTHER): Payer: Self-pay | Admitting: Urology

## 2023-08-15 ENCOUNTER — Other Ambulatory Visit (HOSPITAL_COMMUNITY): Payer: Self-pay

## 2023-08-15 ENCOUNTER — Telehealth: Payer: Self-pay

## 2023-08-15 NOTE — Telephone Encounter (Signed)
Pharmacy Patient Advocate Encounter   Received notification from CoverMyMeds that prior authorization for REPATHA  is required/requested.   Insurance verification completed.   The patient is insured through [facility] .   Per test claim: PA required; PA submitted to [facility] via CoverMyMeds Key/confirmation #/EOC Key: [id number]   Status is pending

## 2023-08-15 NOTE — Telephone Encounter (Signed)
Pharmacy Patient Advocate Encounter  Received notification from [facility] that Prior Authorization for Repatha has been APPROVED from 9.5.24 to 9.5.25. Ran test claim, and The Rx is too soon to fill.The Rx is payable again on/after 08/26/23.   This test claim was processed through [facility]- copay amounts may vary at other pharmacies due to pharmacy/plan contracts, or as the patient moves through the different stages of their insurance plan.

## 2023-08-23 DIAGNOSIS — C673 Malignant neoplasm of anterior wall of bladder: Secondary | ICD-10-CM

## 2023-08-23 HISTORY — DX: Malignant neoplasm of anterior wall of bladder: C67.3

## 2023-09-20 ENCOUNTER — Other Ambulatory Visit: Payer: Self-pay

## 2023-09-20 MED ORDER — REPATHA SURECLICK 140 MG/ML ~~LOC~~ SOAJ: 1.0000 mL | SUBCUTANEOUS | 3 refills | Status: DC

## 2023-10-17 ENCOUNTER — Telehealth: Payer: Self-pay | Admitting: Cardiology

## 2023-10-17 ENCOUNTER — Other Ambulatory Visit: Payer: Self-pay | Admitting: Urology

## 2023-10-17 NOTE — Telephone Encounter (Signed)
1st attempt to reach pt to schedule tele visit. Lvm  

## 2023-10-17 NOTE — Telephone Encounter (Signed)
*  STAT* If patient is at the pharmacy, call can be transferred to refill team.   1. Which medications need to be refilled? (please list name of each medication and dose if known) olmesartan (BENICAR) 20 MG tablet   2. Which pharmacy/location (including street and city if local pharmacy) is medication to be sent to? [facility] - [place], [place] - [address]   3. Do they need a 30 day or 90 day supply? 90

## 2023-10-17 NOTE — Telephone Encounter (Signed)
   Pre-operative Risk Assessment    Patient Name: Lee Stevens  DOB: 10/17/1961 MRN: [id number]      Request for Surgical Clearance    Procedure:   Transrectal Ultrasound w/ Prostate Biopsy   Date of Surgery:  Clearance 11/19/23                                 Surgeon:  Dr. Traci Sermon  Surgeon's Group or Practice Name:  [facility]  Phone number:  [phone] Fax number:  [phone]   Type of Clearance Requested:   - Medical  - Pharmacy:  Hold Clopidogrel (Plavix) 5 day hold    Type of Anesthesia:  General    Additional requests/questions:    Signed, April Henson   10/17/2023, 4:21 PM

## 2023-10-17 NOTE — Telephone Encounter (Signed)
   Name: Lee Stevens  DOB: Oct 03, 1961  MRN: [id number]  Primary Cardiologist: Norman Herrlich, MD   Preoperative team, please contact this patient and set up a phone call appointment for further preoperative risk assessment. Please obtain consent and complete medication review. Thank you for your help.  I confirm that guidance regarding antiplatelet and oral anticoagulation therapy has been completed and, if necessary, noted below.  Per protocol patient can hold Plavix 75 mg 5 days prior to procedure and should restart postprocedure when surgically safe and hemostasis is achieved.   I also confirmed the patient resides in the state of [place]. As per [facility] Medical Board telemedicine laws, the patient must reside in the state in which the provider is licensed.   Napoleon Form, Leodis Rains, NP 10/17/2023, 4:26 PM [facility]

## 2023-10-18 ENCOUNTER — Other Ambulatory Visit (HOSPITAL_COMMUNITY): Payer: Self-pay | Admitting: Urology

## 2023-10-18 ENCOUNTER — Telehealth: Payer: Self-pay | Admitting: Cardiology

## 2023-10-18 ENCOUNTER — Other Ambulatory Visit: Payer: Self-pay

## 2023-10-18 DIAGNOSIS — R3989 Other symptoms and signs involving the genitourinary system: Secondary | ICD-10-CM

## 2023-10-18 MED ORDER — OLMESARTAN MEDOXOMIL 20 MG PO TABS: 20.0000 mg | ORAL_TABLET | Freq: Two times a day (BID) | ORAL | 3 refills | Status: DC

## 2023-10-18 NOTE — Telephone Encounter (Signed)
Did you call this patient?

## 2023-10-18 NOTE — Telephone Encounter (Signed)
 Pt returning nurses phone call regarding clearance. Please advise

## 2023-10-18 NOTE — Telephone Encounter (Signed)
Bolick, Rachel A50 minutes ago (9:45 AM)   RB Pt returning nurses phone call regarding clearance. Please advise       Note   Culver, Drain [phone]  Leonia Reader A   Left message to call back to schedule tele pre op appt.

## 2023-10-18 NOTE — Telephone Encounter (Signed)
As of today we have attempted x 2 to reach the pt.

## 2023-10-18 NOTE — Telephone Encounter (Signed)
Pt is returning call for Pre op Clearance

## 2023-10-18 NOTE — Telephone Encounter (Signed)
Patient's Benicar medication was re-filled and the patient was made aware of the re-fill. Patient had no further questions at this time.

## 2023-10-21 ENCOUNTER — Telehealth: Payer: Self-pay | Admitting: *Deleted

## 2023-10-21 NOTE — Telephone Encounter (Signed)
Pt has been scheduled tele pre op appt 10/31/23. Med rec and consent are done.     Patient Consent for Virtual Visit        Lee Stevens has provided verbal consent on 10/21/2023 for a virtual visit (video or telephone).   CONSENT FOR VIRTUAL VISIT FOR:  Lee Stevens  By participating in this virtual visit I agree to the following:  I hereby voluntarily request, consent and authorize [facility] and its employed or contracted physicians, physician assistants, nurse practitioners or other licensed health care professionals (the Practitioner), to provide me with telemedicine health care services (the "Services") as deemed necessary by the treating Practitioner. I acknowledge and consent to receive the Services by the Practitioner via telemedicine. I understand that the telemedicine visit will involve communicating with the Practitioner through live audiovisual communication technology and the disclosure of certain medical information by electronic transmission. I acknowledge that I have been given the opportunity to request an in-person assessment or other available alternative prior to the telemedicine visit and am voluntarily participating in the telemedicine visit.  I understand that I have the right to withhold or withdraw my consent to the use of telemedicine in the course of my care at any time, without affecting my right to future care or treatment, and that the Practitioner or I may terminate the telemedicine visit at any time. I understand that I have the right to inspect all information obtained and/or recorded in the course of the telemedicine visit and may receive copies of available information for a reasonable fee.  I understand that some of the potential risks of receiving the Services via telemedicine include:  Delay or interruption in medical evaluation due to technological equipment failure or disruption; Information transmitted may not be sufficient (e.g. poor  resolution of images) to allow for appropriate medical decision making by the Practitioner; and/or  In rare instances, security protocols could fail, causing a breach of personal health information.  Furthermore, I acknowledge that it is my responsibility to provide information about my medical history, conditions and care that is complete and accurate to the best of my ability. I acknowledge that Practitioner's advice, recommendations, and/or decision may be based on factors not within their control, such as incomplete or inaccurate data provided by me or distortions of diagnostic images or specimens that may result from electronic transmissions. I understand that the practice of medicine is not an exact science and that Practitioner makes no warranties or guarantees regarding treatment outcomes. I acknowledge that a copy of this consent can be made available to me via my patient portal [facility] MyChart), or I can request a printed copy by calling the office of [facility].    I understand that my insurance will be billed for this visit.   I have read or had this consent read to me. I understand the contents of this consent, which adequately explains the benefits and risks of the Services being provided via telemedicine.  I have been provided ample opportunity to ask questions regarding this consent and the Services and have had my questions answered to my satisfaction. I give my informed consent for the services to be provided through the use of telemedicine in my medical care

## 2023-10-21 NOTE — Telephone Encounter (Signed)
Pt has been scheduled tele preop appt 10/31/23. Med rec and consent are done.

## 2023-10-25 DIAGNOSIS — Q2112 Patent foramen ovale: Secondary | ICD-10-CM

## 2023-10-25 HISTORY — DX: Patent foramen ovale: Q21.12

## 2023-10-28 DIAGNOSIS — U071 COVID-19: Secondary | ICD-10-CM | POA: Insufficient documentation

## 2023-10-31 ENCOUNTER — Ambulatory Visit: Payer: 59 | Attending: Internal Medicine | Admitting: Cardiology

## 2023-10-31 DIAGNOSIS — Z01810 Encounter for preprocedural cardiovascular examination: Secondary | ICD-10-CM

## 2023-10-31 NOTE — Progress Notes (Signed)
Virtual Visit via Telephone Note   Because of Lee Stevens's co-morbid illnesses, he is at least at moderate risk for complications without adequate follow up.  This format is felt to be most appropriate for this patient at this time.  The patient did not have access to video technology/had technical difficulties with video requiring transitioning to audio format only (telephone).  All issues noted in this document were discussed and addressed.  No physical exam could be performed with this format.  Please refer to the patient's chart for his consent to telehealth for [facility].  Evaluation Performed:  Preoperative cardiovascular risk assessment _____________   Date:  10/31/2023   Patient ID:  SIPRIANO CANCEL, DOB [birth year], MRN [id number] Patient Location:  Home Provider location:   Office  Primary Care Provider:  Olive Bass, MD Primary Cardiologist:  Norman Herrlich, MD  Chief Complaint / Patient Profile   62 y.o. y/o male with a h/o CAD, hypertension, OSA, GERD, hypothyroidism, CKD stage III, anxiety, hyperlipidemia and statin intolerance who is pending Transrectal Ultrasound w/ Prostate Biopsy  With Dr. Lafonda Mosses and presents today for telephonic preoperative cardiovascular risk assessment.  History of Present Illness    Lee Stevens is a 62 y.o. male who presents via audio/video conferencing for a telehealth visit today.  Pt was last seen in cardiology clinic on 03/28/23 by Wallis Bamberg, NP.  At that time SANATH SANE was doing well.  The patient is now pending procedure as outlined above. Since his last visit, he has remained stable from a cardiac standpoint. He denies chest pain, palpitations, dyspnea, pnd, orthopnea, n, v, dizziness, syncope, edema, weight gain, or early satiety.   Past Medical History    Past Medical History:  Diagnosis Date   Allergic rhinitis    Anticoagulant long-term use    plavix---   managed by cardiology   Anxiety disorder 2018    Atypical chest pain 09/25/2018   followed by cardiology-- dr Dulce Sellar;  continue's to monitor for long history per dr Dulce Sellar note pt had cardiac cath in 2008 w/ luminal irregularity;  normal stress echo 04/ 2017;   Benign hypertension 01/19/2016   Bladder tumor 05/2023   BPH associated with nocturia    CAD in native artery 12/01/2018   cardiologist--- dr Dulce Sellar;   coronary CT FFR/ morph 11-14-2018  calcium score=514 involving RCA/ LAD/ PDA/ PL;   cardiac cath 08-09-2022  nonob CAD mild LAD 40%   Chronic kidney disease, stage 3 (HCC) 04/19/2017   folllowed by pcp   ETD (Eustachian tube dysfunction), right    GERD (gastroesophageal reflux disease)    History of anal fissures 04/10/2016   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 Christell Constant (note in care everywhere )---  06/ 2024 parotid right greater than left ,  resolved without intervention   Pre-diabetes    Prostate nodule 04/25/2023   Right lower quadrant abdominal pain 05/09/2023   Subclinical hypothyroidism    Tinnitus of both ears 11/26/2018   Wears glasses    Past Surgical History:  Procedure  Laterality Date   ANAL FISSURE REPAIR  07/06/2016   @[facility]  BY DR Alveda Reasons;   ENDORECTAL ADVANCEMENT FLAP 07-06-2016  AND RE-DO 10-09-2016   CARDIAC CATHETERIZATION  2008   per pt's cardiologist-- dr Dulce Sellar-- note , luminal irregurilities   CHOLECYSTECTOMY, LAPAROSCOPIC  2008   COLONOSCOPY  2016   COLOSTOMY TAKEDOWN  06/02/2018   @[facility] ;   ILEOSTOMY TAKEDOWN W/ ANORECTAL EUA   EXPLORATORY LAPAROTOMY W/ BOWEL RESECTION  06/28/2017   @[facility] ;   SMALL BOWEL RESECTION W/  ILEOSTOMY REVISION   EXTRACORPOREAL SHOCK WAVE LITHOTRIPSY  2006   LAPAROSCOPIC APPENDECTOMY  2000   LAPAROSCOPIC LOOP COLOSTOMY  06/07/2017   @[facility]  BY DR S. FULLER;   LOOP ILEOSTOMY CREATION   LEFT HEART CATH AND CORONARY ANGIOGRAPHY N/A 08/09/2022   Procedure: LEFT HEART CATH AND CORONARY ANGIOGRAPHY;  Surgeon: Corky Crafts, MD;  Location: [facility];  Service: Cardiovascular;  Laterality: N/A;   PLACEMENT OF SETON     @[facility] ;   08-29-2016  AND 11-05-2016   RECTAL EXAM UNDER ANESTHESIA  03/07/2018   @[facility]    TONSILLECTOMY AND ADENOIDECTOMY     child   TOTAL HIP ARTHROPLASTY Bilateral 2011   6 months apart same year   TRANSURETHRAL RESECTION OF BLADDER TUMOR WITH MITOMYCIN-C N/A 06/26/2023   Procedure: TRANSURETHRAL RESECTION OF BLADDER TUMOR WITH INTRAOPERATIVE INSTILLATION OF GEMCITABINE;  Surgeon: Despina Arias, MD;  Location: [facility];  Service: Urology;  Laterality: N/A;  1 MINUTES NEEDED FOR CASE   TURBINATE REDUCTION  05/03/2023   @[facility]  by dr d. Christell Constant;   BILATERAL INFERIOR   Allergies Allergies  Allergen Reactions   Keflex [Cephalexin] Anaphylaxis and Rash   Hydrochlorothiazide     Joint Pain.    Rosuvastatin     Myalgias (intolerance)     Home Medications    Prior to Admission medications   Medication Sig Start Date End Date Taking? Authorizing Provider  ALPRAZolam Prudy Feeler) 0.25 MG tablet Take 0.25 mg by mouth as needed for anxiety. 03/27/23   [provider]  azelastine (ASTELIN) 0.1 % nasal spray Place into both nostrils 2 (two) times daily as needed for rhinitis. Use in each nostril as directed Patient not taking: Reported on 10/21/2023    [provider]  clopidogrel (PLAVIX) 75 MG tablet Take 1 tablet (75 mg total) by mouth daily. Patient taking differently: Take 75 mg by mouth at bedtime. 02/19/23   Baldo Daub, MD  Evolocumab (REPATHA SURECLICK) 140 MG/ML SOAJ Inject 140 mg into the skin every 14  (fourteen) days. 09/20/23   Baldo Daub, MD  fluticasone (FLONASE) 50 MCG/ACT nasal spray Place 2 sprays into both nostrils at bedtime. Patient not taking: Reported on 10/21/2023 02/14/21   [provider]  hyoscyamine (LEVSIN/SL) 0.125 MG SL tablet Place 1 tablet (0.125 mg total) under the tongue every 4 (four) hours as needed. Patient not taking: Reported on 10/21/2023 06/26/23   Despina Arias, MD  lansoprazole (PREVACID) 15 MG capsule Take 15 mg by mouth at bedtime. 04/25/23   [provider]  levothyroxine (SYNTHROID) 75 MCG tablet Take 75 mcg by mouth every morning. 12/27/20   [provider]  Melatonin 1 MG CAPS Take 1 capsule (1 mg total) by mouth at bedtime. Patient not taking: Reported on 10/21/2023 03/28/23   Flossie Dibble, NP  metoprolol succinate (TOPROL-XL) 25 MG 24 hr tablet TAKE 1 TABLET BY MOUTH ONCE DAILY Patient taking differently: Take 25 mg by  mouth daily. TAKE 1 TABLET BY MOUTH ONCE DAILY 01/18/23   Baldo Daub, MD  Multiple Vitamin (MULTIVITAMIN) tablet Take 1 tablet by mouth daily.    [provider]  nitroGLYCERIN (NITROSTAT) 0.4 MG SL tablet Place 0.4 mg under the tongue every 5 (five) minutes as needed for chest pain. Patient not taking: Reported on 10/21/2023    [provider]  olmesartan (BENICAR) 20 MG tablet Take 1 tablet (20 mg total) by mouth 2 (two) times daily. 10/18/23   Flossie Dibble, NP  sulfamethoxazole-trimethoprim (BACTRIM DS) 800-160 MG tablet Take 1 tablet by mouth 2 (two) times daily. Patient not taking: Reported on 10/21/2023 06/26/23   Despina Arias, MD    Physical Exam    Vital Signs:  JONATHANDAVID MUNKRES does not have vital signs available for review today.  Given telephonic nature of communication, physical exam is limited. AAOx3. NAD. Normal affect.  Speech and respirations are unlabored.  Accessory Clinical Findings  None Assessment & Plan    1.  Preoperative Cardiovascular Risk  Assessment:Transrectal Ultrasound w/ Prostate Biopsy  With Dr. Lafonda Mosses  Mr. [phone] perioperative risk of a major cardiac event is 0.4% according to the Revised Cardiac Risk Index (RCRI).  Therefore, he is at low risk for perioperative complications.   His functional capacity is excellent at 8.97 METs according to the Duke Activity Status Index (DASI). Recommendations: According to ACC/AHA guidelines, no further cardiovascular testing needed.  The patient may proceed to surgery at acceptable risk.   Antiplatelet and/or Anticoagulation Recommendations: Per protocol patient can hold Plavix 5 days prior to procedure and should restart postprocedure when surgically safe and hemostasis is achieved.    The patient was advised that if he develops new symptoms prior to surgery to contact our office to arrange for a follow-up visit, and he verbalized understanding.  A copy of this note will be routed to requesting surgeon.  Time:   Today, I have spent 5 minutes with the patient with telehealth technology discussing medical history, symptoms, and management plan.    Rip Harbour, NP  10/31/2023, 1:27 PM

## 2023-11-12 ENCOUNTER — Encounter (HOSPITAL_BASED_OUTPATIENT_CLINIC_OR_DEPARTMENT_OTHER): Payer: Self-pay | Admitting: Urology

## 2023-11-12 ENCOUNTER — Other Ambulatory Visit: Payer: Self-pay

## 2023-11-13 ENCOUNTER — Encounter (HOSPITAL_BASED_OUTPATIENT_CLINIC_OR_DEPARTMENT_OTHER): Payer: Self-pay | Admitting: Urology

## 2023-11-13 NOTE — Progress Notes (Addendum)
Spoke w/ via phone for pre-op interview---[facility] needs dos----    Istat     Lab results------07/02/23 EKG in chart & Epic COVID test -----patient states asymptomatic no test needed Arrive at -------1245 on Tuesday, 11/19/23 NPO after MN NO Solid Food.  Clear liquids from MN until---1145 Med rec completed Medications to take morning of surgery -----Xanax prn, Synthroid, Metoprolol  Hold Plavix x 5 days per cardiology note in chart. Diabetic medication -----n/a Patient instructed no nail polish to be worn day of surgery Patient instructed to bring photo id and insurance card day of surgery Patient aware to have Driver (ride ) / caregiver    for 24 hours after surgery - wife, Selena Batten Patient Special Instructions -----none Pre-Op special Instructions -----Patient is HOH in left ear. Patient verbalized understanding of instructions that were given at this phone interview. Patient denies chest pain, sob, fever, cough at the interview.   Anesthesia Review:  PCP: Dr. Dina Rich, LOV 10/28/23 in Care Everywhere Cardiologist : Dr. Dulce Sellar, LOV via telephone, [phone] / Cardiac clearance from [facility], NP dated 10/31/23 in chart & Epic. Chest x-ray :  EKG : 03/28/23 Echo : Stress test: Cardiac Cath : 08/09/22 in Epic Activity level:  Sleep Study/ CPAP : Fasting Blood Sugar :      / Checks Blood Sugar -- times a day:   Blood Thinner/ Instructions /Last Dose: Hold Plavix x 5 days prior to surgery per cardiology. Last dose before surgery on 11/14/23. ASA / Instructions/ Last Dose :

## 2023-11-18 ENCOUNTER — Other Ambulatory Visit: Payer: Self-pay | Admitting: Urology

## 2023-11-18 DIAGNOSIS — Z01818 Encounter for other preprocedural examination: Secondary | ICD-10-CM

## 2023-11-18 MED ORDER — SODIUM CHLORIDE 0.9 % IV SOLN: INTRAVENOUS | Status: AC

## 2023-11-18 NOTE — Anesthesia Preprocedure Evaluation (Signed)
Anesthesia Evaluation  Patient identified by MRN, date of birth, ID band Patient awake    Reviewed: Allergy & Precautions, NPO status , Patient's Chart, lab work & pertinent test results  Airway Mallampati: II  TM Distance: >3 FB Neck ROM: Full    Dental no notable dental hx. (+) Teeth Intact, Dental Advisory Given   Pulmonary sleep apnea    Pulmonary exam normal breath sounds clear to auscultation       Cardiovascular hypertension, + CAD  Normal cardiovascular exam Rhythm:Regular Rate:Normal     Neuro/Psych   Anxiety        GI/Hepatic ,GERD  ,,  Endo/Other  Hypothyroidism    Renal/GU Renal InsufficiencyRenal disease   ProstateCA    Musculoskeletal  (+) Arthritis ,    Abdominal   Peds  Hematology   Anesthesia Other Findings All Keflex HCTZ  Reproductive/Obstetrics                             Anesthesia Physical Anesthesia Plan  ASA: 3  Anesthesia Plan: MAC   Post-op Pain Management: Tylenol PO (pre-op)* and Precedex   Induction: Intravenous  PONV Risk Score and Plan: 3 and Treatment may vary due to age or medical condition, Ondansetron, Midazolam and Propofol infusion  Airway Management Planned: Nasal Cannula  Additional Equipment: None  Intra-op Plan:   Post-operative Plan:   Informed Consent: I have reviewed the patients History and Physical, chart, labs and discussed the procedure including the risks, benefits and alternatives for the proposed anesthesia with the patient or authorized representative who has indicated his/her understanding and acceptance.     Dental advisory given  Plan Discussed with: CRNA and Anesthesiologist  Anesthesia Plan Comments:         Anesthesia Quick Evaluation

## 2023-11-19 ENCOUNTER — Encounter (HOSPITAL_BASED_OUTPATIENT_CLINIC_OR_DEPARTMENT_OTHER)
Admission: RE | Disposition: A | Discharge status: Home / Self Care | Payer: Self-pay | Source: Home / Self Care | Attending: Urology

## 2023-11-19 ENCOUNTER — Ambulatory Visit (HOSPITAL_BASED_OUTPATIENT_CLINIC_OR_DEPARTMENT_OTHER): Payer: 59 | Admitting: Anesthesiology

## 2023-11-19 ENCOUNTER — Other Ambulatory Visit: Payer: Self-pay

## 2023-11-19 ENCOUNTER — Ambulatory Visit (HOSPITAL_COMMUNITY)
Admission: RE | Admit: 2023-11-19 | Discharge: 2023-11-19 | Disposition: A | Discharge status: Home / Self Care | Payer: 59 | Source: Ambulatory Visit | Attending: Urology | Admitting: Urology

## 2023-11-19 ENCOUNTER — Encounter (HOSPITAL_BASED_OUTPATIENT_CLINIC_OR_DEPARTMENT_OTHER): Payer: Self-pay | Admitting: Urology

## 2023-11-19 ENCOUNTER — Ambulatory Visit (HOSPITAL_BASED_OUTPATIENT_CLINIC_OR_DEPARTMENT_OTHER)
Admission: RE | Admit: 2023-11-19 | Discharge: 2023-11-19 | Disposition: A | Discharge status: Home / Self Care | Payer: 59 | Attending: Urology | Admitting: Urology

## 2023-11-19 DIAGNOSIS — C61 Malignant neoplasm of prostate: Secondary | ICD-10-CM | POA: Insufficient documentation

## 2023-11-19 DIAGNOSIS — Z01818 Encounter for other preprocedural examination: Secondary | ICD-10-CM

## 2023-11-19 DIAGNOSIS — Z87891 Personal history of nicotine dependence: Secondary | ICD-10-CM | POA: Diagnosis not present

## 2023-11-19 DIAGNOSIS — N429 Disorder of prostate, unspecified: Secondary | ICD-10-CM | POA: Diagnosis present

## 2023-11-19 DIAGNOSIS — R3989 Other symptoms and signs involving the genitourinary system: Secondary | ICD-10-CM | POA: Insufficient documentation

## 2023-11-19 HISTORY — DX: Malignant (primary) neoplasm, unspecified: C80.1

## 2023-11-19 HISTORY — PX: TRANSRECTAL ULTRASOUND: SHX5146

## 2023-11-19 HISTORY — DX: Unspecified osteoarthritis, unspecified site: M19.90

## 2023-11-19 HISTORY — DX: Unspecified hearing loss, unspecified ear: H91.90

## 2023-11-19 LAB — POCT I-STAT, CHEM 8
BUN: 15 mg/dL (ref 6–20)
Calcium, Ion: 1.2 mmol/L (ref 1.15–1.40)
Chloride: 102 mmol/L (ref 98–111)
Creatinine, Ser: 1.5 mg/dL — ABNORMAL HIGH (ref 0.61–1.24)
Glucose, Bld: 99 mg/dL (ref 70–99)
HCT: 45 % (ref 39.0–52.0)
Hemoglobin: 15.3 g/dL (ref 13.0–17.0)
Potassium: 4.3 mmol/L (ref 3.5–5.1)
Sodium: 138 mmol/L (ref 135–145)
TCO2: 23 mmol/L (ref 22–32)

## 2023-11-19 SURGERY — ULTRASOUND, RECTAL APPROACH
Anesthesia: General | Site: Prostate

## 2023-11-19 MED ORDER — MIDAZOLAM HCL 5 MG/5ML IJ SOLN
INTRAMUSCULAR | Status: DC | PRN
  Administered 2023-11-19: 2 mg via INTRAVENOUS

## 2023-11-19 MED ORDER — CIPROFLOXACIN IN D5W 400 MG/200ML IV SOLN
INTRAVENOUS | Status: AC
  Filled 2023-11-19: qty 200

## 2023-11-19 MED ORDER — MIDAZOLAM HCL 2 MG/2ML IJ SOLN
INTRAMUSCULAR | Status: AC
  Filled 2023-11-19: qty 2

## 2023-11-19 MED ORDER — LIDOCAINE HCL (PF) 2 % IJ SOLN
INTRAMUSCULAR | Status: AC
  Filled 2023-11-19: qty 5

## 2023-11-19 MED ORDER — ONDANSETRON HCL 4 MG/2ML IJ SOLN
INTRAMUSCULAR | Status: DC | PRN
  Administered 2023-11-19: 4 mg via INTRAVENOUS

## 2023-11-19 MED ORDER — FENTANYL CITRATE (PF) 100 MCG/2ML IJ SOLN
INTRAMUSCULAR | Status: AC
  Filled 2023-11-19: qty 2

## 2023-11-19 MED ORDER — FENTANYL CITRATE (PF) 100 MCG/2ML IJ SOLN
INTRAMUSCULAR | Status: DC | PRN
  Administered 2023-11-19 (×2): 25 ug via INTRAVENOUS

## 2023-11-19 MED ORDER — PROPOFOL 500 MG/50ML IV EMUL
INTRAVENOUS | Status: DC | PRN
  Administered 2023-11-19: 50 ug/kg/min via INTRAVENOUS

## 2023-11-19 MED ORDER — CIPROFLOXACIN IN D5W 400 MG/200ML IV SOLN
400.0000 mg | INTRAVENOUS | Status: AC
  Administered 2023-11-19: 50 mg via INTRAVENOUS

## 2023-11-19 MED ORDER — DEXAMETHASONE SODIUM PHOSPHATE 10 MG/ML IJ SOLN
INTRAMUSCULAR | Status: AC
  Filled 2023-11-19: qty 1

## 2023-11-19 MED ORDER — DEXAMETHASONE SODIUM PHOSPHATE 4 MG/ML IJ SOLN
INTRAMUSCULAR | Status: DC | PRN
  Administered 2023-11-19: 5 mg via INTRAVENOUS

## 2023-11-19 MED ORDER — LIDOCAINE HCL 2 % IJ SOLN
INTRAMUSCULAR | Status: DC | PRN
  Administered 2023-11-19: 10 mL

## 2023-11-19 MED ORDER — OXYCODONE HCL 5 MG/5ML PO SOLN: 5.0000 mg | Freq: Once | ORAL | Status: DC | PRN

## 2023-11-19 MED ORDER — CLINDAMYCIN PHOSPHATE 900 MG/50ML IV SOLN
INTRAVENOUS | Status: DC | PRN
  Administered 2023-11-19: 900 mg via INTRAVENOUS

## 2023-11-19 MED ORDER — PROPOFOL 10 MG/ML IV BOLUS
INTRAVENOUS | Status: AC
Start: 2023-11-19 — End: ?
  Filled 2023-11-19: qty 20

## 2023-11-19 MED ORDER — HYDROMORPHONE HCL 1 MG/ML IJ SOLN: 0.2500 mg | INTRAMUSCULAR | Status: DC | PRN

## 2023-11-19 MED ORDER — PROPOFOL 1000 MG/100ML IV EMUL
INTRAVENOUS | Status: AC
Start: 2023-11-19 — End: ?
  Filled 2023-11-19: qty 100

## 2023-11-19 MED ORDER — KETOROLAC TROMETHAMINE 30 MG/ML IJ SOLN: 30.0000 mg | Freq: Once | INTRAMUSCULAR | Status: DC | PRN

## 2023-11-19 MED ORDER — SODIUM CHLORIDE 0.9 % IV SOLN: INTRAVENOUS | Status: DC

## 2023-11-19 MED ORDER — ONDANSETRON HCL 4 MG/2ML IJ SOLN
INTRAMUSCULAR | Status: AC
  Filled 2023-11-19: qty 2

## 2023-11-19 MED ORDER — LIDOCAINE HCL (CARDIAC) PF 100 MG/5ML IV SOSY
PREFILLED_SYRINGE | INTRAVENOUS | Status: DC | PRN
  Administered 2023-11-19: 100 mg via INTRAVENOUS

## 2023-11-19 MED ORDER — ONDANSETRON HCL 4 MG/2ML IJ SOLN: 4.0000 mg | Freq: Once | INTRAMUSCULAR | Status: DC | PRN

## 2023-11-19 MED ORDER — CLINDAMYCIN PHOSPHATE 900 MG/50ML IV SOLN
INTRAVENOUS | Status: AC
Start: 2023-11-19 — End: ?
  Filled 2023-11-19: qty 50

## 2023-11-19 MED ORDER — OXYCODONE HCL 5 MG PO TABS
5.0000 mg | ORAL_TABLET | Freq: Once | ORAL | Status: DC | PRN
Start: 2023-11-19 — End: 2023-11-20

## 2023-11-19 SURGICAL SUPPLY — 13 items
GLOVE BIO SURGEON STRL SZ7 (GLOVE) IMPLANT
GLOVE SURG SS PI 6.5 STRL IVOR (GLOVE) IMPLANT
INST BIOPSY MAXCORE 18GX25 (NEEDLE) IMPLANT
INSTR BIOPSY MAXCORE 18GX20 (NEEDLE) IMPLANT
KIT TURNOVER CYSTO (KITS) ×1 IMPLANT
NDL HYPO 22X1.5 SAFETY MO (MISCELLANEOUS) IMPLANT
NDL SAFETY ECLIPSE 18X1.5 (NEEDLE) IMPLANT
NDL SPNL 22GX7 QUINCKE BK (NEEDLE) ×1 IMPLANT
NEEDLE HYPO 22X1.5 SAFETY MO (MISCELLANEOUS) IMPLANT
NEEDLE SPNL 22GX7 QUINCKE BK (NEEDLE) ×1 IMPLANT
SLEEVE SCD COMPRESS KNEE MED (STOCKING) ×1 IMPLANT
SYR CONTROL 10ML LL (SYRINGE) IMPLANT
UNDERPAD 30X36 HEAVY ABSORB (UNDERPADS AND DIAPERS) ×1 IMPLANT

## 2023-11-19 NOTE — H&P (Signed)
H&P  History of Present Illness: Lee Stevens is a 62 y.o. year old M who presents today for TRUS biopsy due to irregular prostate exam  Past Medical History:  Diagnosis Date   Allergic rhinitis    Anticoagulant long-term use    plavix---   managed by cardiology   Anxiety disorder 2018   Arthritis    neck   Atypical chest pain 09/25/2018   followed by cardiology-- dr Dulce Sellar;  continue's to monitor for long history per dr Dulce Sellar note pt had cardiac cath in 2008 w/ luminal irregularity;  normal stress echo 04/ 2017;   Benign hypertension 01/19/2016   Bladder tumor 05/2023   BPH associated with nocturia    CAD in native artery 12/01/2018   cardiologist--- dr Dulce Sellar;   coronary CT FFR/ morph 11-14-2018  calcium score=514 involving RCA/ LAD/ PDA/ PL;   cardiac cath 08-09-2022  nonob CAD mild LAD 40%   Cancer (HCC)    bladder   Chronic kidney disease, stage 3 (HCC) 04/19/2017   folllowed by pcp   ETD (Eustachian tube dysfunction), right    GERD (gastroesophageal reflux disease)    History of anal fissures 04/10/2016   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 Christell Constant (note in care everywhere )---  06/ 2024 parotid right greater than left ,  resolved without intervention   Prostate nodule 04/25/2023   Right lower quadrant abdominal pain 05/09/2023   Subclinical hypothyroidism    Tinnitus of both ears 11/26/2018   Wears glasses     Past Surgical History:  Procedure Laterality Date   ANAL FISSURE REPAIR  07/06/2016   @[facility]  BY DR Alveda Reasons;   ENDORECTAL ADVANCEMENT FLAP  07-06-2016  AND RE-DO 10-09-2016   CARDIAC CATHETERIZATION  2008   per pt's cardiologist-- dr Dulce Sellar-- note , luminal irregurilities   CHOLECYSTECTOMY, LAPAROSCOPIC  2008   COLONOSCOPY  2016   COLOSTOMY TAKEDOWN  06/02/2018   @[facility] ;   ILEOSTOMY TAKEDOWN W/ ANORECTAL EUA   EXPLORATORY LAPAROTOMY W/ BOWEL RESECTION  06/28/2017   @[facility] ;   SMALL BOWEL RESECTION W/ ILEOSTOMY REVISION   EXTRACORPOREAL SHOCK WAVE LITHOTRIPSY  2006   LAPAROSCOPIC APPENDECTOMY  2000   LAPAROSCOPIC LOOP COLOSTOMY  06/07/2017   @[facility]  BY DR S. FULLER;   LOOP ILEOSTOMY CREATION   LEFT HEART CATH AND CORONARY ANGIOGRAPHY N/A 08/09/2022   Procedure: LEFT HEART CATH AND CORONARY ANGIOGRAPHY;  Surgeon: Corky Crafts, MD;  Location: [facility];  Service: Cardiovascular;  Laterality: N/A;   PLACEMENT OF SETON     @[facility] ;   08-29-2016  AND 11-05-2016   RECTAL EXAM UNDER ANESTHESIA  03/07/2018   @[facility]    TONSILLECTOMY AND ADENOIDECTOMY     child   TOTAL HIP ARTHROPLASTY Bilateral 2011   6 months apart same year   TRANSURETHRAL RESECTION OF BLADDER TUMOR WITH MITOMYCIN-C N/A 06/26/2023   Procedure: TRANSURETHRAL RESECTION OF BLADDER TUMOR WITH INTRAOPERATIVE INSTILLATION OF GEMCITABINE;  Surgeon: Despina Arias, MD;  Location: [facility];  Service: Urology;  Laterality: N/A;  75 MINUTES NEEDED FOR CASE   TURBINATE REDUCTION  05/03/2023   @[facility]  by dr d. Christell Constant;   BILATERAL INFERIOR    Home Medications:  Current Meds  Medication Sig   ALPRAZolam (XANAX) 0.25 MG tablet Take 0.25 mg by mouth as needed for anxiety.   Evolocumab (REPATHA SURECLICK) 140 MG/ML SOAJ Inject 140 mg into the skin every 14 (fourteen) days.   lansoprazole (PREVACID) 15 MG capsule Take 15 mg by mouth at bedtime.   levothyroxine (SYNTHROID) 75 MCG tablet Take 75 mcg by mouth every morning.   metoprolol succinate (TOPROL-XL) 25 MG 24 hr tablet TAKE 1 TABLET BY MOUTH ONCE DAILY (Patient taking differently: Take 25 mg by  mouth daily. TAKE 1 TABLET BY MOUTH ONCE DAILY)   Multiple Vitamin (MULTIVITAMIN) tablet Take 1 tablet by mouth daily.   olmesartan (BENICAR) 20 MG tablet Take 1 tablet (20 mg total) by mouth 2 (two) times daily. (Patient taking differently: Take 20 mg by mouth daily. ONLY takes 20 mg in morning.)   sulfamethoxazole-trimethoprim (BACTRIM DS) 800-160 MG tablet Take 1 tablet by mouth 2 (two) times daily.    Allergies:  Allergies  Allergen Reactions   Keflex [Cephalexin] Anaphylaxis and Rash   Hydrochlorothiazide     Joint Pain.    Rosuvastatin     Myalgias (intolerance)      Family History  Problem Relation Age of Onset   Hypertension Mother    Heart attack Father    Hyperlipidemia Father     Social History:  reports that he has never smoked. He quit smokeless tobacco use about 20 years ago.  His smokeless tobacco use included snuff. He reports that he does not currently use alcohol. He reports that he does not use drugs.  ROS: A complete review of systems was performed.  All systems are negative except for pertinent findings as noted.  Physical Exam:  Vital signs in last 24 hours: Temp:  [97.6 F (36.4 C)] 97.6 F (36.4 C) (12/10 1311) Pulse Rate:  [64] 64 (12/10 1311) Resp:  [17] 17 (12/10 1311) BP: (132)/(83) 132/83 (12/10 1311) SpO2:  [98 %] 98 % (12/10 1311) Weight:  [95.2 kg] 95.2 kg (12/10 1311) Constitutional:  Alert and oriented, No acute distress Cardiovascular: Regular rate and rhythm Respiratory: Normal respiratory effort, Lungs clear bilaterally GI: Abdomen is soft, nontender, nondistended, no abdominal masses Lymphatic: No lymphadenopathy Neurologic: Grossly intact, no focal deficits Psychiatric: Normal mood and affect   Laboratory Data:  Recent Labs    11/19/23 1333  HGB 15.3  HCT 45.0    Recent Labs    11/19/23 1333  NA 138  K 4.3  CL 102  GLUCOSE 99  BUN 15  CREATININE 1.50*     Results for orders placed or performed during the  hospital encounter of 11/19/23 (from the past 24 hour(s))  I-STAT, chem 8     Status: Abnormal   Collection Time: 11/19/23  1:33 PM  Result Value Ref Range   Sodium 138 135 - 145 mmol/L   Potassium 4.3 3.5 - 5.1 mmol/L   Chloride 102 98 - 111 mmol/L   BUN 15 6 - 20 mg/dL   Creatinine, Ser 9.62 (H) 0.61 - 1.24 mg/dL   Glucose, Bld 99 70 - 99 mg/dL   Calcium, Ion 9.52 1.15 - 1.40 mmol/L   TCO2 23 22 - 32 mmol/L   Hemoglobin 15.3 13.0 - 17.0 g/dL   HCT 84.1 32.4 - 40.1 %  No results found for this or any previous visit (from the past 240 hour(s)).  Renal Function: Recent Labs    11/19/23 1333  CREATININE 1.50*   Estimated Creatinine Clearance: 62.6 mL/min (A) (by C-G formula based on SCr of 1.5 mg/dL (H)).  Radiologic Imaging: No results found.  Assessment:  Lee Stevens is a 62 y.o. year old M with irregular prostate exam  Plan:  To OR for prostate biopsy. Procedure and risks reviewed.  Irine Seal, MD 11/19/2023, 2:22 PM  [facility] Pager: [phone]

## 2023-11-19 NOTE — Discharge Instructions (Addendum)
You should avoid strenuous activities today but may resume all normal activities tomorrow.  2.   You can take Tylenol as needed for any pain or discomfort.        Post Anesthesia Home Care Instructions  Activity: Get plenty of rest for the remainder of the day. A responsible individual must stay with you for 24 hours following the procedure.  For the next 24 hours, DO NOT: -Drive a car -Advertising copywriter -Drink alcoholic beverages -Take any medication unless instructed by your physician -Make any legal decisions or sign important papers.  Meals: Start with liquid foods such as gelatin or soup. Progress to regular foods as tolerated. Avoid greasy, spicy, heavy foods. If nausea and/or vomiting occur, drink only clear liquids until the nausea and/or vomiting subsides. Call your physician if vomiting continues.  Special Instructions/Symptoms: Your throat may feel dry or sore from the anesthesia or the breathing tube placed in your throat during surgery. If this causes discomfort, gargle with warm salt water. The discomfort should disappear within 24 hours.

## 2023-11-19 NOTE — Transfer of Care (Signed)
Immediate Anesthesia Transfer of Care Note  Patient: Lee Stevens  Procedure(s) Performed: TRANSRECTAL ULTRASOUND WITH PROSTATE BIOPSY (Prostate)  Patient Location: PACU  Anesthesia Type:MAC  Level of Consciousness: awake, alert , oriented, and patient cooperative  Airway & Oxygen Therapy: Patient Spontanous Breathing and Patient connected to face mask oxygen  Post-op Assessment: Report given to RN and Post -op Vital signs reviewed and stable  Post vital signs: Reviewed and stable  Last Vitals:  Vitals Value Taken Time  BP 110/74 11/19/23 1541  Temp    Pulse 58 11/19/23 1544  Resp 12 11/19/23 1544  SpO2 93 % 11/19/23 1544  Vitals shown include unfiled device data.  Last Pain:  Vitals:   11/19/23 1311  TempSrc: Oral         Complications: No notable events documented.

## 2023-11-19 NOTE — Op Note (Signed)
Preoperative diagnosis: abnormal DRE  Postoperative diagnosis: same  Procedure: 1. Transrectal ultrasound of the prostate 2. Prostate needle biopsy  Surgeon: Irine Seal MD  Anesthesia: IV sedation  Complications: None  EBL: Minimal  Specimens: Specimens were obtained from the lateral and parasagittal regions of the base, mid, and apex of the prostate bilaterally. A total of 12 biopsy cores were obtained.  Prostate volume was 13.67. no abnormal hypoechoic areas or masses identifeid via TRUS  Disposition specimens: Pathology lab.   Description of procedure:  The patient was taken to the operating room and conscious sedation was administered. He had been provided preoperative antibiotics and did prepare himself with an enema prior to his procedure. He was laid in the left lateral decubitus position. The transrectal ultrasound probe was then placed into the rectum and used to visualize the prostate. 10 cc of 1% lidocaine was then utilized to provide local anesthesia with a periprostatic nerve block. Images of the prostate were then obtained systematically. . The prostate volume was measured at 13.7 cc. Systematic biopsies of the prostate were then performed under ultrasound guidance. A total of 12 cores were obtained. Biopsies were taken from the lateral and parasagittal regions of the base, mid, and apex of the prostate bilaterally.  Following removal of the transrectal ultrasound, a digital rectal exam was performed and it was confirmed that no significant bleeding was present. The patient tolerated the procedure well and without complications.

## 2023-11-20 NOTE — Anesthesia Postprocedure Evaluation (Signed)
Anesthesia Post Note  Patient: Lee Stevens  Procedure(s) Performed: TRANSRECTAL ULTRASOUND WITH PROSTATE BIOPSY (Prostate)     Patient location during evaluation: PACU Anesthesia Type: MAC Level of consciousness: awake and alert Pain management: pain level controlled Vital Signs Assessment: post-procedure vital signs reviewed and stable Respiratory status: spontaneous breathing, nonlabored ventilation, respiratory function stable and patient connected to nasal cannula oxygen Cardiovascular status: stable and blood pressure returned to baseline Postop Assessment: no apparent nausea or vomiting Anesthetic complications: no   No notable events documented.  Last Vitals:  Vitals:   11/19/23 1615 11/19/23 1630  BP: 124/85 128/84  Pulse: (!) 57 (!) 53  Resp: 11 11  Temp:    SpO2: 93% 93%    Last Pain:  Vitals:   11/19/23 1541  TempSrc:   PainSc: 0-No pain                 Trevor Iha

## 2023-11-20 NOTE — Addendum Note (Signed)
Addendum  created 11/20/23 1640 by Trevor Iha, MD   Intraprocedure Staff edited

## 2023-11-21 ENCOUNTER — Encounter (HOSPITAL_BASED_OUTPATIENT_CLINIC_OR_DEPARTMENT_OTHER): Payer: Self-pay | Admitting: Urology

## 2023-11-21 LAB — SURGICAL PATHOLOGY

## 2023-11-28 ENCOUNTER — Other Ambulatory Visit (HOSPITAL_COMMUNITY): Payer: Self-pay | Admitting: Urology

## 2023-11-28 DIAGNOSIS — C61 Malignant neoplasm of prostate: Secondary | ICD-10-CM

## 2023-12-05 ENCOUNTER — Encounter (HOSPITAL_COMMUNITY)
Admission: RE | Admit: 2023-12-05 | Discharge: 2023-12-05 | Disposition: A | Discharge status: Home / Self Care | Payer: 59 | Source: Ambulatory Visit | Attending: Urology | Admitting: Urology

## 2023-12-05 DIAGNOSIS — C61 Malignant neoplasm of prostate: Secondary | ICD-10-CM | POA: Diagnosis present

## 2023-12-05 LAB — GLUCOSE, CAPILLARY: Glucose-Capillary: 100 mg/dL — ABNORMAL HIGH (ref 70–99)

## 2023-12-05 MED ORDER — FLOTUFOLASTAT F 18 GALLIUM 296-5846 MBQ/ML IV SOLN
8.5800 | Freq: Once | INTRAVENOUS | Status: AC
  Administered 2023-12-05: 8.58 via INTRAVENOUS

## 2023-12-12 NOTE — Progress Notes (Signed)
 GU Location of Tumor / Histology: Prostate Ca  If Prostate Cancer, Gleason Score is (4 + 3) and PSA is (1.35 on 04/25/2023)  PSA  0.76 on 10/25/2022 PSA  0.53 on 10/23/2021  Biopsies     12/05/2023 Dr. Arlyss Foot NM PET (PSMA) Skull to Mid Thigh CLINICAL DATA: Prostate carcinoma with biochemical recurrence.   IMPRESSION: 1. Mild nonspecific radiotracer activity within the prostate gland. 2. No evidence metastatic adenopathy in the pelvis or periaortic retroperitoneum. 3. No evidence of visceral metastasis or skeletal metastasis.   Past/Anticipated interventions by urology, if any:     Past/Anticipated interventions by medical oncology, if any: NA  Weight changes, if any:  No  IPSS:  6 SHIM:  24  Bowel/Bladder complaints, if any:  No  Nausea/Vomiting, if any: No  Pain issues, if any:  0/10  SAFETY ISSUES: Prior radiation?  No Pacemaker/ICD? No Possible current pregnancy? Male Is the patient on methotrexate? No  Current Complaints / other details:

## 2023-12-13 DIAGNOSIS — R102 Pelvic and perineal pain: Secondary | ICD-10-CM | POA: Diagnosis not present

## 2023-12-13 DIAGNOSIS — C61 Malignant neoplasm of prostate: Secondary | ICD-10-CM | POA: Diagnosis not present

## 2023-12-13 DIAGNOSIS — C673 Malignant neoplasm of anterior wall of bladder: Secondary | ICD-10-CM | POA: Diagnosis not present

## 2023-12-16 NOTE — Progress Notes (Signed)
 Radiation Oncology         [phone] ________________________________  Initial Outpatient Consultation  Name: Lee Stevens MRN: [id number]  Date: 12/17/2023  DOB: 1961/06/08  RR:Inlhy, Lamar CROME, MD  Lovie Arlyss CROME, MD   REFERRING PHYSICIAN: Lovie Arlyss CROME, MD  DIAGNOSIS: 63 y.o. gentleman with Stage T2a adenocarcinoma of the prostate with Gleason score of 4+3, and PSA of 1.35.    ICD-10-CM   1. Malignant neoplasm of prostate (HCC)  C61       HISTORY OF PRESENT ILLNESS: Lee Stevens is a 63 y.o. male with a diagnosis of prostate cancer. He was initially referred for evaluation in urology by Dr. Lovie on 06/07/23 for an episode of painless gross hematuria and abnormal finding on CT A/P on 05/07/23 that showed a 1.1 x 0.6 x 1 cm sessile solid nodular lesion along the left lower lateral bladder wall. Accordingly, a urology consult and cystoscopy were recommended. At the time of his consult with Dr. Lovie on 06/07/23, digital rectal examination showed no nodules or induration but an in-office cystoscopy, also performed that day, showed a left lateral bladder wall/dome tumor. He subsequently underwent TURBT on 06/26/23 and was found to have noninvasive low grade papillary urothelial carcinoma. When he returned for routine follow up on 10/10/23, he reported he had been told he had an irregular prostate exam by his GI provider. A repeat DRE performed that day confirmed a hard right lobe of the prostate. The patient's most recent PSA from 04/2023 was WNL at 1.35. The patient proceeded to transrectal ultrasound with 12 biopsies of the prostate on 11/19/23.  The prostate volume measured 13.67 cc.  Out of 12 core biopsies, 8 were positive.  The maximum Gleason score was 4+3, and this was seen in the right base lateral, right base, right mid, and left base. Additionally, Gleason 3+4 was seen in the right mid lateral and left base lateral, and small foci of Gleason 3+3 in the left mid lateral and left  apex lateral.  He underwent staging PSMA PET scan on 12/05/23 showing no evidence of disease outside of the prostate.  The patient reviewed the biopsy and imaging results with his urologist and he has kindly been referred today for discussion of potential radiation treatment options. He is also scheduled for a consult visit with Dr. Renda 01/07/24 to discuss his surgical options.  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 Taft Riding at [facility]).   PREVIOUS RADIATION THERAPY: No  PAST MEDICAL HISTORY:  Past Medical History:  Diagnosis Date   Allergic rhinitis    Anticoagulant long-term use    plavix ---   managed by cardiology   Anxiety disorder 2018   Arthritis    neck   Atypical chest pain 09/25/2018   followed by cardiology-- dr monetta;  continue's to monitor for long history per dr monetta note pt had cardiac cath in 2008 w/ luminal irregularity;  normal stress echo 04/ 2017;   Benign hypertension 01/19/2016   Bladder tumor 05/2023   BPH associated with nocturia    CAD in native artery 12/01/2018   cardiologist--- dr monetta;   coronary CT FFR/ morph 11-14-2018  calcium  score=514 involving RCA/ LAD/ PDA/ PL;   cardiac cath 08-09-2022  nonob CAD mild LAD 40%   Cancer (HCC)    bladder   Chronic kidney disease, stage 3 (HCC) 04/19/2017  folllowed by pcp   ETD (Eustachian tube dysfunction), right    GERD (gastroesophageal reflux disease)    History of anal fissures 04/10/2016   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 georgina (note in care everywhere )---  06/ 2024 parotid right greater than left ,  resolved without intervention   Prostate nodule 04/25/2023   Right lower quadrant abdominal pain 05/09/2023   Subclinical hypothyroidism    Tinnitus of both ears 11/26/2018   Wears glasses       PAST SURGICAL HISTORY: Past Surgical History:  Procedure Laterality Date   ANAL FISSURE REPAIR  07/06/2016   @[facility]  BY DR CANDIE RIDING;   ENDORECTAL ADVANCEMENT FLAP 07-06-2016  AND RE-DO 10-09-2016   CARDIAC CATHETERIZATION  2008   per pt's cardiologist-- dr monetta-- note , luminal irregurilities   CHOLECYSTECTOMY, LAPAROSCOPIC  2008   COLONOSCOPY  2016   COLOSTOMY TAKEDOWN  06/02/2018   @[facility] ;   ILEOSTOMY TAKEDOWN W/ ANORECTAL EUA   EXPLORATORY LAPAROTOMY W/ BOWEL RESECTION  06/28/2017   @[facility] ;   SMALL BOWEL RESECTION W/ ILEOSTOMY REVISION   EXTRACORPOREAL SHOCK WAVE LITHOTRIPSY  2006   LAPAROSCOPIC APPENDECTOMY  2000   LAPAROSCOPIC LOOP COLOSTOMY  06/07/2017   @[facility]  BY DR S. FULLER;   LOOP ILEOSTOMY CREATION   LEFT HEART CATH AND CORONARY ANGIOGRAPHY N/A 08/09/2022   Procedure: LEFT HEART CATH AND CORONARY ANGIOGRAPHY;  Surgeon: Dann Candyce RAMAN, MD;  Location: [facility];  Service: Cardiovascular;  Laterality: N/A;   PLACEMENT OF SETON     @[facility] ;   08-29-2016  AND 11-05-2016   PROSTATE BIOPSY     RECTAL EXAM UNDER ANESTHESIA  03/07/2018   @[facility]    TONSILLECTOMY AND ADENOIDECTOMY     child   TOTAL HIP ARTHROPLASTY Bilateral 2011   6 months apart same year   TRANSRECTAL ULTRASOUND N/A 11/19/2023   Procedure: TRANSRECTAL ULTRASOUND WITH PROSTATE BIOPSY;  Surgeon: Lovie Arlyss CROME, MD;  Location[facility];  Service: Urology;  Laterality: N/A;   TRANSURETHRAL RESECTION OF BLADDER TUMOR WITH MITOMYCIN -C N/A 06/26/2023   Procedure: TRANSURETHRAL RESECTION OF BLADDER TUMOR WITH INTRAOPERATIVE INSTILLATION OF GEMCITABINE ;  Surgeon: Lovie Arlyss CROME, MD;  Location: [facility];  Service: Urology;  Laterality: N/A;  24 MINUTES NEEDED FOR CASE   TURBINATE REDUCTION  05/03/2023   @[facility]  by dr d. georgina;   BILATERAL INFERIOR    FAMILY HISTORY:  Family History  Problem Relation Age of Onset   Hypertension Mother    Heart attack Father    Hyperlipidemia Father     SOCIAL HISTORY:  Social History   Socioeconomic History   Marital status: Married    Spouse name: Not on file   Number of children: Not on file   Years of education: Not on file   Highest education level: Not on file  Occupational History   Not on file  Tobacco Use   Smoking status: Never   Smokeless tobacco: Former    Types: Snuff  Quit date: 2004  Vaping Use   Vaping status: Never Used  Substance and Sexual Activity   Alcohol use: Not Currently   Drug use: Never   Sexual activity: Not on file  Other Topics Concern   Not on file  Social History Narrative   Not on file   Social Drivers of Health   Financial Resource Strain: Low Risk  (12/05/2021)   Received from [facility]   Overall Financial Resource Strain (CARDIA)    Difficulty of Paying Living Expenses: Not hard at all  Food Insecurity: No Food Insecurity (12/17/2023)   Hunger Vital Sign    Worried About Running Out of Food in the Last Year: Never true    Ran Out of Food in the Last Year: Never true  Transportation Needs: No Transportation Needs (12/17/2023)   PRAPARE - Administrator, Civil Service (Medical): No    Lack of Transportation (Non-Medical): No  Physical Activity: Insufficiently Active (12/05/2021)   Received from [facility], [facility]   Exercise Vital Sign    Days of Exercise per Week: 2 days    Minutes of Exercise per Session: 20 min  Stress: No Stress Concern Present (12/05/2021)   Received from [facility], [facility] - Occupational Stress Questionnaire    Feeling of Stress  : Not at all  Social Connections: Unknown (04/23/2022)   Received from [facility]   Social Network    Social Network: Not on file  Intimate Partner Violence: Not At Risk (12/17/2023)   Humiliation, Afraid, Rape, and Kick questionnaire    Fear of Current or Ex-Partner: No    Emotionally Abused: No    Physically Abused: No    Sexually Abused: No    ALLERGIES: Keflex [cephalexin], Hydrochlorothiazide, and Rosuvastatin  MEDICATIONS:  Current Outpatient Medications  Medication Sig Dispense Refill   acyclovir (ZOVIRAX) 200 MG capsule SMARTSIG:1 Capsule(s) By Mouth Every 3 Hours     dicyclomine (BENTYL) 10 MG capsule Take 10 mg by mouth every 6 (six) hours as needed.     tamsulosin (FLOMAX) 0.4 MG CAPS capsule Take 0.4 mg by mouth daily.     ALPRAZolam  (XANAX ) 0.25 MG tablet Take 0.25 mg by mouth as needed for anxiety.     clopidogrel  (PLAVIX ) 75 MG tablet Take 1 tablet (75 mg total) by mouth daily. (Patient taking differently: Take 75 mg by mouth at bedtime.) 90 tablet 3   Evolocumab  (REPATHA  SURECLICK) 140 MG/ML SOAJ Inject 140 mg into the skin every 14 (fourteen) days. 6 mL 3   lansoprazole (PREVACID) 15 MG capsule Take 15 mg by mouth at bedtime.     levothyroxine  (SYNTHROID ) 75 MCG tablet Take 75 mcg by mouth every morning.     metoprolol  succinate (TOPROL -XL) 25 MG 24 hr tablet TAKE 1 TABLET BY MOUTH ONCE DAILY (Patient taking differently: Take 25 mg by mouth daily. TAKE 1 TABLET BY MOUTH ONCE DAILY) 90 tablet 3   Multiple Vitamin (MULTIVITAMIN) tablet Take 1 tablet by mouth daily.     nitroGLYCERIN  (NITROSTAT ) 0.4 MG SL tablet Place 0.4 mg under the tongue every 5 (five) minutes as needed for chest pain. (Patient not taking: Reported on 10/21/2023)     olmesartan  (BENICAR ) 20 MG tablet Take 1 tablet (20 mg total) by mouth 2 (two) times daily. (Patient taking differently: Take 20 mg by mouth daily. ONLY takes 20 mg in morning.) 180 tablet 3   No  current  facility-administered medications for this encounter.    REVIEW OF SYSTEMS:  On review of systems, the patient reports that he is doing well overall. He denies any chest pain, shortness of breath, cough, fevers, chills, night sweats, unintended weight changes. He denies any bowel disturbances, and denies abdominal pain, nausea or vomiting. He denies any new musculoskeletal or joint aches or pains. His IPSS was 6, indicating mild urinary symptoms. His SHIM was 24, indicating he does not have erectile dysfunction. A complete review of systems is obtained and is otherwise negative.    PHYSICAL EXAM:  Wt Readings from Last 3 Encounters:  12/17/23 215 lb (97.5 kg)  11/19/23 209 lb 14.4 oz (95.2 kg)  06/26/23 217 lb 11.2 oz (98.7 kg)   Temp Readings from Last 3 Encounters:  12/17/23 97.8 F (36.6 C)  11/19/23 97.7 F (36.5 C)  06/26/23 97.6 F (36.4 C)   BP Readings from Last 3 Encounters:  12/17/23 136/70  11/19/23 128/84  06/26/23 131/78   Pulse Readings from Last 3 Encounters:  12/17/23 60  11/19/23 (!) 53  06/26/23 (!) 53    /10  In general this is a well appearing Caucasian man in no acute distress. He's alert and oriented x4 and appropriate throughout the examination. Cardiopulmonary assessment is negative for acute distress, and he exhibits normal effort.    KPS = 100  100 - Normal; no complaints; no evidence of disease. 90   - Able to carry on normal activity; minor signs or symptoms of disease. 80   - Normal activity with effort; some signs or symptoms of disease. 31   - Cares for self; unable to carry on normal activity or to do active work. 60   - Requires occasional assistance, but is able to care for most of his personal needs. 50   - Requires considerable assistance and frequent medical care. 40   - Disabled; requires special care and assistance. 30   - Severely disabled; hospital admission is indicated although death not imminent. 20   - Very sick; hospital  admission necessary; active supportive treatment necessary. 10   - Moribund; fatal processes progressing rapidly. 0     - Dead  Karnofsky DA, Abelmann WH, Craver LS and Burchenal [facility] [phone]) The use of the nitrogen mustards in the palliative treatment of carcinoma: with particular reference to bronchogenic carcinoma Cancer 1 634-56  LABORATORY DATA:  Lab Results  Component Value Date   WBC 5.4 08/07/2022   HGB 15.3 11/19/2023   HCT 45.0 11/19/2023   MCV 88 08/07/2022   PLT 244 08/07/2022   Lab Results  Component Value Date   NA 138 11/19/2023   K 4.3 11/19/2023   CL 102 11/19/2023   CO2 30 (H) 08/07/2022   No results found for: ALT, AST, GGT, ALKPHOS, BILITOT   RADIOGRAPHY: NM PET (PSMA) SKULL TO MID THIGH Result Date: 12/09/2023 CLINICAL DATA:  Prostate carcinoma with biochemical recurrence. EXAM: NUCLEAR MEDICINE PET SKULL BASE TO THIGH TECHNIQUE: 8.58 mCi Flotufolastat (Posluma ) was injected intravenously. Full-ring PET imaging was performed from the skull base to thigh after the radiotracer. CT data was obtained and used for attenuation correction and anatomic localization. COMPARISON:  None Available. FINDINGS: NECK No radiotracer activity in neck lymph nodes. Incidental CT finding: None CHEST No radiotracer accumulation within mediastinal or hilar lymph nodes. No suspicious pulmonary nodules on the CT scan. Incidental CT finding: None. ABDOMEN/PELVIS Prostate: Significant streak artifact through the pelvis related to the bilateral hip  prosthetics. Mild radiotracer activity in the prostate gland is nonspecific. Lymph nodes: No abnormal radiotracer accumulation within pelvic or abdominal nodes. Liver: No evidence of liver metastasis. Incidental CT finding: Postcholecystectomy. SKELETON No aggressive osseous lesion. No abnormal radiotracer activity. Bilateral hip prosthetics. IMPRESSION: 1. Mild nonspecific radiotracer activity within the prostate gland. 2. No evidence metastatic  adenopathy in the pelvis or periaortic retroperitoneum. 3. No evidence of visceral metastasis or skeletal metastasis. Electronically Signed   By: Jackquline Boxer M.D.   On: 12/09/2023 10:40   US  PROSTATE BIOPSY MULTIPLE Result Date: 11/19/2023 Please see Notes tab for imaging impression.  US  Transrectal Complete Result Date: 11/19/2023 Please see Notes tab for imaging impression.  US  Guided Needle Placement Result Date: 11/19/2023 CLINICAL DATA:  Ultrasound was provided for use by the ordering physician.  No provider Interpretation or professional fees incurred.    US  Intraoperative Result Date: 11/19/2023 CLINICAL DATA:  Ultrasound was provided for use by the ordering physician.  No provider Interpretation or professional fees incurred.       IMPRESSION/PLAN: 1. 63 y.o. gentleman with Stage T2a adenocarcinoma of the prostate with Gleason Score of 4+3, and PSA of 1.35. We discussed the patient's workup and outlined the nature of prostate cancer in this setting. The patient's T stage, Gleason's score, and PSA put him into the unfavorable intermediate risk group. Accordingly, he is eligible for a variety of potential treatment options including brachytherapy, 5.5 weeks of external radiation, or prostatectomy. We discussed the available radiation techniques, and focused on the details and logistics of delivery. The patient is not an ideal candidate for brachytherapy with a prostate volume of 14 cc. Therefore, we discussed and outlined the risks, benefits, short and long-term effects associated with radiotherapy and compared and contrasted these with prostatectomy. We discussed the role of SpaceOAR gel in reducing the rectal toxicity associated with radiotherapy. He appears to have a good understanding of his disease and our treatment recommendations which are of curative intent.  He was encouraged to ask questions that were answered to his stated satisfaction.  At the conclusion of our  conversation, the patient is interested in moving forward with his scheduled consult visit with Dr. Renda on 01/07/24 to discuss his surgical treatment options prior to making a final decision regarding his treatment preference. We will share our discussion with Dr. Lovie and Dr. Renda and plan to follow up with the patient in early 01/2024 to confirm his treatment decision. He has our contact information and knows that he is welcome to call at anytime with any questions or concerns in the interim. We enjoyed meeting him and his wife today and look forward to continuing to participate in his care.  We personally spent 70 minutes in this encounter including chart review, reviewing radiological studies, meeting face-to-face with the patient, entering orders and completing documentation.    Sabra MICAEL Rusk, PA-C    Donnice Barge, MD  [facility]  Radiation Oncology Direct Dial: [phone]  Fax: [phone] [email or web address]  Skype  LinkedIn   This document serves as a record of services personally performed by Donnice Barge, MD and Sabra Rusk, PA-C. It was created on their behalf by Izetta Neither, a trained medical scribe. The creation of this record is based on the scribe's personal observations and the provider's statements to them. This document has been checked and approved by the attending provider.

## 2023-12-17 ENCOUNTER — Ambulatory Visit
Admission: RE | Admit: 2023-12-17 | Discharge: 2023-12-17 | Disposition: A | Discharge status: Home / Self Care | Payer: Self-pay | Source: Ambulatory Visit | Attending: Radiation Oncology | Admitting: Radiation Oncology

## 2023-12-17 ENCOUNTER — Ambulatory Visit
Admission: RE | Admit: 2023-12-17 | Discharge: 2023-12-17 | Disposition: A | Discharge status: Home / Self Care | Payer: BC Managed Care – PPO | Source: Ambulatory Visit | Attending: Radiation Oncology | Admitting: Radiation Oncology

## 2023-12-17 ENCOUNTER — Encounter: Payer: Self-pay | Admitting: Radiation Oncology

## 2023-12-17 VITALS — BP 136/70 | HR 60 | Temp 97.8°F | Resp 20 | Ht 75.0 in | Wt 215.0 lb

## 2023-12-17 DIAGNOSIS — Z87442 Personal history of urinary calculi: Secondary | ICD-10-CM | POA: Insufficient documentation

## 2023-12-17 DIAGNOSIS — E038 Other specified hypothyroidism: Secondary | ICD-10-CM | POA: Diagnosis not present

## 2023-12-17 DIAGNOSIS — Z79890 Hormone replacement therapy (postmenopausal): Secondary | ICD-10-CM | POA: Diagnosis not present

## 2023-12-17 DIAGNOSIS — E782 Mixed hyperlipidemia: Secondary | ICD-10-CM | POA: Insufficient documentation

## 2023-12-17 DIAGNOSIS — I129 Hypertensive chronic kidney disease with stage 1 through stage 4 chronic kidney disease, or unspecified chronic kidney disease: Secondary | ICD-10-CM | POA: Diagnosis not present

## 2023-12-17 DIAGNOSIS — N183 Chronic kidney disease, stage 3 unspecified: Secondary | ICD-10-CM | POA: Diagnosis not present

## 2023-12-17 DIAGNOSIS — Z79899 Other long term (current) drug therapy: Secondary | ICD-10-CM | POA: Diagnosis not present

## 2023-12-17 DIAGNOSIS — I10 Essential (primary) hypertension: Secondary | ICD-10-CM | POA: Insufficient documentation

## 2023-12-17 DIAGNOSIS — I2510 Atherosclerotic heart disease of native coronary artery without angina pectoris: Secondary | ICD-10-CM | POA: Insufficient documentation

## 2023-12-17 DIAGNOSIS — Z7901 Long term (current) use of anticoagulants: Secondary | ICD-10-CM | POA: Diagnosis not present

## 2023-12-17 DIAGNOSIS — Z191 Hormone sensitive malignancy status: Secondary | ICD-10-CM | POA: Diagnosis not present

## 2023-12-17 DIAGNOSIS — K219 Gastro-esophageal reflux disease without esophagitis: Secondary | ICD-10-CM | POA: Insufficient documentation

## 2023-12-17 DIAGNOSIS — C61 Malignant neoplasm of prostate: Secondary | ICD-10-CM | POA: Insufficient documentation

## 2023-12-17 DIAGNOSIS — Z7902 Long term (current) use of antithrombotics/antiplatelets: Secondary | ICD-10-CM | POA: Diagnosis not present

## 2023-12-17 DIAGNOSIS — Z87891 Personal history of nicotine dependence: Secondary | ICD-10-CM | POA: Insufficient documentation

## 2023-12-17 HISTORY — DX: Malignant neoplasm of prostate: C61

## 2023-12-17 NOTE — Progress Notes (Signed)
 Introduced myself to the patient, and his wife, as the prostate nurse navigator.  No barriers to care identified at this time.  He is here to discuss his radiation treatment options and also has a scheduled surgical consult with Dr. Renda on 1/28.  I will contact [facility] to see if there is a possibility of a sooner date.  I gave him my business card and asked him to call me with questions or concerns.  Verbalized understanding.

## 2024-01-03 ENCOUNTER — Encounter: Payer: Self-pay | Admitting: *Deleted

## 2024-01-03 ENCOUNTER — Other Ambulatory Visit: Payer: Self-pay | Admitting: *Deleted

## 2024-01-03 DIAGNOSIS — I129 Hypertensive chronic kidney disease with stage 1 through stage 4 chronic kidney disease, or unspecified chronic kidney disease: Secondary | ICD-10-CM

## 2024-01-03 DIAGNOSIS — Q2112 Patent foramen ovale: Secondary | ICD-10-CM

## 2024-01-03 DIAGNOSIS — E782 Mixed hyperlipidemia: Secondary | ICD-10-CM

## 2024-01-03 DIAGNOSIS — I2510 Atherosclerotic heart disease of native coronary artery without angina pectoris: Secondary | ICD-10-CM

## 2024-01-03 MED ORDER — METOPROLOL SUCCINATE ER 25 MG PO TB24: 25.0000 mg | ORAL_TABLET | Freq: Every day | ORAL | 0 refills | Status: DC

## 2024-01-03 NOTE — Telephone Encounter (Signed)
Rx refill sent to pharmacy.

## 2024-01-07 DIAGNOSIS — C61 Malignant neoplasm of prostate: Secondary | ICD-10-CM | POA: Diagnosis not present

## 2024-01-09 NOTE — Progress Notes (Signed)
Patient was recent radiation oncology consult on 12/17/23 for his stage T2a adenocarcinoma of the prostate with Gleason score of 4+3, and PSA of 1.35 and has confirmed he will proceed with surgery with Dr. Laverle Patter.  Dr. Laverle Patter aware.

## 2024-01-10 ENCOUNTER — Telehealth: Payer: Self-pay

## 2024-01-10 NOTE — Telephone Encounter (Signed)
 Error

## 2024-01-16 ENCOUNTER — Other Ambulatory Visit (HOSPITAL_COMMUNITY): Payer: Self-pay

## 2024-01-16 ENCOUNTER — Telehealth: Payer: Self-pay

## 2024-01-16 NOTE — Telephone Encounter (Signed)
 Pharmacy Patient Advocate Encounter   Received notification from CoverMyMeds that prior authorization for REPATHA  is required/requested.   Insurance verification completed.   The patient is insured through [facility] .   Per test claim: PA required; PA submitted to above mentioned insurance via CoverMyMeds Key/confirmation #/EOC [id number] Status is pending

## 2024-01-16 NOTE — Telephone Encounter (Signed)
 Pharmacy Patient Advocate Encounter  Received notification from [employer] that Prior Authorization for REPATHA  has been APPROVED from 01/14/24 to 01/13/25. Ran test claim, Copay is $24.99. This test claim was processed through [facility]- copay amounts may vary at other pharmacies due to pharmacy/plan contracts, or as the patient moves through the different stages of their insurance plan.

## 2024-01-21 ENCOUNTER — Other Ambulatory Visit: Payer: Self-pay | Admitting: Urology

## 2024-01-21 DIAGNOSIS — C61 Malignant neoplasm of prostate: Secondary | ICD-10-CM

## 2024-01-22 ENCOUNTER — Other Ambulatory Visit (HOSPITAL_COMMUNITY): Payer: Self-pay | Admitting: Urology

## 2024-01-22 DIAGNOSIS — C61 Malignant neoplasm of prostate: Secondary | ICD-10-CM

## 2024-01-27 ENCOUNTER — Ambulatory Visit (HOSPITAL_COMMUNITY)
Admission: RE | Admit: 2024-01-27 | Discharge: 2024-01-27 | Disposition: A | Discharge status: Home / Self Care | Payer: BC Managed Care – PPO | Source: Ambulatory Visit | Attending: Urology | Admitting: Urology

## 2024-01-27 DIAGNOSIS — C61 Malignant neoplasm of prostate: Secondary | ICD-10-CM | POA: Insufficient documentation

## 2024-01-27 DIAGNOSIS — N4289 Other specified disorders of prostate: Secondary | ICD-10-CM | POA: Diagnosis not present

## 2024-01-27 DIAGNOSIS — Z96643 Presence of artificial hip joint, bilateral: Secondary | ICD-10-CM | POA: Diagnosis not present

## 2024-01-27 DIAGNOSIS — N400 Enlarged prostate without lower urinary tract symptoms: Secondary | ICD-10-CM | POA: Diagnosis not present

## 2024-01-27 MED ORDER — GADOBUTROL 1 MMOL/ML IV SOLN
10.0000 mL | Freq: Once | INTRAVENOUS | Status: AC | PRN
  Administered 2024-01-27: 10 mL via INTRAVENOUS

## 2024-02-04 ENCOUNTER — Other Ambulatory Visit: Payer: Self-pay | Admitting: Urology

## 2024-02-04 ENCOUNTER — Telehealth: Payer: Self-pay | Admitting: Cardiology

## 2024-02-04 ENCOUNTER — Telehealth: Payer: Self-pay | Admitting: *Deleted

## 2024-02-04 NOTE — Telephone Encounter (Signed)
 Pt has been scheduled tele preop appt 02/17/24. Med rec and consent are done.     Patient Consent for Virtual Visit        Lee Stevens has provided verbal consent on 02/04/2024 for a virtual visit (video or telephone).   CONSENT FOR VIRTUAL VISIT FOR:  Lee Stevens  By participating in this virtual visit I agree to the following:  I hereby voluntarily request, consent and authorize [facility] and its employed or contracted physicians, physician assistants, nurse practitioners or other licensed health care professionals (the Practitioner), to provide me with telemedicine health care services (the "Services") as deemed necessary by the treating Practitioner. I acknowledge and consent to receive the Services by the Practitioner via telemedicine. I understand that the telemedicine visit will involve communicating with the Practitioner through live audiovisual communication technology and the disclosure of certain medical information by electronic transmission. I acknowledge that I have been given the opportunity to request an in-person assessment or other available alternative prior to the telemedicine visit and am voluntarily participating in the telemedicine visit.  I understand that I have the right to withhold or withdraw my consent to the use of telemedicine in the course of my care at any time, without affecting my right to future care or treatment, and that the Practitioner or I may terminate the telemedicine visit at any time. I understand that I have the right to inspect all information obtained and/or recorded in the course of the telemedicine visit and may receive copies of available information for a reasonable fee.  I understand that some of the potential risks of receiving the Services via telemedicine include:  Delay or interruption in medical evaluation due to technological equipment failure or disruption; Information transmitted may not be sufficient (e.g. poor resolution  of images) to allow for appropriate medical decision making by the Practitioner; and/or  In rare instances, security protocols could fail, causing a breach of personal health information.  Furthermore, I acknowledge that it is my responsibility to provide information about my medical history, conditions and care that is complete and accurate to the best of my ability. I acknowledge that Practitioner's advice, recommendations, and/or decision may be based on factors not within their control, such as incomplete or inaccurate data provided by me or distortions of diagnostic images or specimens that may result from electronic transmissions. I understand that the practice of medicine is not an exact science and that Practitioner makes no warranties or guarantees regarding treatment outcomes. I acknowledge that a copy of this consent can be made available to me via my patient portal [facility] MyChart), or I can request a printed copy by calling the office of  [facility].    I understand that my insurance will be billed for this visit.   I have read or had this consent read to me. I understand the contents of this consent, which adequately explains the benefits and risks of the Services being provided via telemedicine.  I have been provided ample opportunity to ask questions regarding this consent and the Services and have had my questions answered to my satisfaction. I give my informed consent for the services to be provided through the use of telemedicine in my medical care

## 2024-02-04 NOTE — Telephone Encounter (Signed)
   Name: Lee Stevens  DOB: 23-Sep-1961  MRN: [id number]  Primary Cardiologist: Norman Herrlich, MD   Preoperative team, please contact this patient and set up a phone call appointment for further preoperative risk assessment. Please obtain consent and complete medication review. Thank you for your help.  I confirm that guidance regarding antiplatelet and oral anticoagulation therapy has been completed and, if necessary, noted below.  Per protocol patient can hold Plavix 5 days prior to procedure and should restart postprocedure when surgically safe and hemostasis is achieved.  I also confirmed the patient resides in the state of [place]. As per [facility] telemedicine laws, the patient must reside in the state in which the provider is licensed.   Napoleon Form, Leodis Rains, NP 02/04/2024, 2:47 PM [facility]

## 2024-02-04 NOTE — Telephone Encounter (Signed)
   Pre-operative Risk Assessment    Patient Name: Lee Stevens  DOB: March 06, 1961 MRN: [id number]   Date of last office visit: 03/28/2023 Date of next office visit: N/A   Request for Surgical Clearance    Procedure:   Prostatectomy   Date of Surgery:  Clearance 02/27/24                                Surgeon:  Dr. Everardo All [facility] or Practice Name:  [facility] Phone number:  [phone] Ext. [phone] Fax number:  [phone]   Type of Clearance Requested:   - Medical  - Pharmacy:  Hold Clopidogrel (Plavix) 5 days prior    Type of Anesthesia:  General    Additional requests/questions:  Please fax a copy of medical clearance to the surgeon's office.  Mardelle Matte   02/04/2024, 2:38 PM

## 2024-02-04 NOTE — Telephone Encounter (Signed)
 Pt has been scheduled tele preop appt 02/17/24. Med rec and consent are done.

## 2024-02-05 ENCOUNTER — Other Ambulatory Visit: Payer: Self-pay | Admitting: Urology

## 2024-02-05 DIAGNOSIS — C61 Malignant neoplasm of prostate: Secondary | ICD-10-CM | POA: Diagnosis not present

## 2024-02-05 DIAGNOSIS — M6281 Muscle weakness (generalized): Secondary | ICD-10-CM | POA: Diagnosis not present

## 2024-02-11 DIAGNOSIS — Z8551 Personal history of malignant neoplasm of bladder: Secondary | ICD-10-CM | POA: Diagnosis not present

## 2024-02-11 DIAGNOSIS — C61 Malignant neoplasm of prostate: Secondary | ICD-10-CM | POA: Diagnosis not present

## 2024-02-12 DIAGNOSIS — M62838 Other muscle spasm: Secondary | ICD-10-CM | POA: Diagnosis not present

## 2024-02-12 DIAGNOSIS — M6281 Muscle weakness (generalized): Secondary | ICD-10-CM | POA: Diagnosis not present

## 2024-02-12 DIAGNOSIS — N393 Stress incontinence (female) (male): Secondary | ICD-10-CM | POA: Diagnosis not present

## 2024-02-13 DIAGNOSIS — M25512 Pain in left shoulder: Secondary | ICD-10-CM

## 2024-02-13 HISTORY — DX: Pain in left shoulder: M25.512

## 2024-02-17 ENCOUNTER — Ambulatory Visit: Payer: BC Managed Care – PPO | Attending: Physician Assistant | Admitting: Physician Assistant

## 2024-02-17 DIAGNOSIS — Z01810 Encounter for preprocedural cardiovascular examination: Secondary | ICD-10-CM

## 2024-02-17 NOTE — Telephone Encounter (Signed)
 Notes faxed to surgeon. Tereso Newcomer, PA-C  02/17/2024 6:09 PM

## 2024-02-17 NOTE — Progress Notes (Signed)
   Virtual Visit via Telephone Note   Because of MYLIK PRO co-morbid illnesses, he is at least at moderate risk for complications without adequate follow up.  This format is felt to be most appropriate for this patient at this time.  Due to technical limitations with video connection Web designer), today's appointment will be conducted as an audio only telehealth visit, and Lee Stevens verbally agreed to proceed in this manner.   All issues noted in this document were discussed and addressed.  No physical exam could be performed with this format.  Evaluation Performed:  Preoperative cardiovascular risk assessment _____________   Date:  02/17/2024   Patient ID:  Lee Stevens, DOB Jun 21, 1961, MRN [id number] Patient Location:  Home Provider location:   Office  Primary Care Provider:  Olive Bass, MD Primary Cardiologist:  Norman Herrlich, MD  Chief Complaint / Patient Profile   63 y.o. y/o male with a h/o  Coronary artery disease, nonobstructive CCTA 11/14/2018: CAC score 514 (96 percentile LHC 08/09/2022: Mid LAD 40 Hypertension  Hyperlipidemia  Patent Foramen Ovale  Chronic kidney disease OSA Tinnitus  ASA DCd in past 2/2 this>>tinnitus +/- improved  who is pending prostatectomy with Dr. Laverle Patter 02/27/2024 under general anesthesia and presents today for telephonic preoperative cardiovascular risk assessment.  Request is to hold Plavix for 5 days.  History of Present Illness    Lee Stevens is a 63 y.o. male who presents via audio/video conferencing for a telehealth visit today.  Pt was last seen in cardiology clinic on 03/28/2023 by Wallis Bamberg, NP.  At that time Lee Stevens he was seen for chest pain felt to be noncardiac and no further workup was pursued.  He had a telephone visit in November 2024 for surgical clearance.  He was doing well at that time without chest pain.  The patient is now pending procedure as outlined above. Since his last visit, he has done well  without chest discomfort, shortness of breath or syncope.  He went to the [employer] basketball game recently and walked for several miles to the stadium and back without any issues.  Physical Exam    Vital Signs:  Lee Stevens does not have vital signs available for review today.  Given telephonic nature of communication, physical exam is limited. AAOx3. NAD. Normal affect.  Speech and respirations are unlabored.  Accessory Clinical Findings    None  Assessment & Plan    1.  Preoperative Cardiovascular Risk Assessment: Lee Stevens perioperative risk of a major cardiac event is 0.9% according to the Revised Cardiac Risk Index (RCRI).  Therefore, he is at low risk for perioperative complications.   His functional capacity is good at 4.31 METs according to the Duke Activity Status Index (DASI). Recommendations: According to ACC/AHA guidelines, no further cardiovascular testing needed.  The patient may proceed to surgery at acceptable risk.   Antiplatelet and/or Anticoagulation Recommendations: Clopidogrel (Plavix) can be held for 5 days prior to his surgery and resumed as soon as possible post op.  A copy of this note will be routed to requesting surgeon.  Time:   Today, I have spent 6 minutes with the patient with telehealth technology discussing medical history, symptoms, and management plan.     Tereso Newcomer, PA-C  02/17/2024, 2:15 PM

## 2024-02-19 DIAGNOSIS — M25512 Pain in left shoulder: Secondary | ICD-10-CM | POA: Diagnosis not present

## 2024-02-19 DIAGNOSIS — M542 Cervicalgia: Secondary | ICD-10-CM

## 2024-02-19 HISTORY — DX: Cervicalgia: M54.2

## 2024-02-20 ENCOUNTER — Encounter: Payer: Self-pay | Admitting: Pharmacy Technician

## 2024-02-20 ENCOUNTER — Other Ambulatory Visit (HOSPITAL_COMMUNITY): Payer: Self-pay

## 2024-02-20 NOTE — Patient Instructions (Signed)
 SURGICAL WAITING ROOM VISITATION  Patients having surgery or a procedure may have no more than 2 support people in the waiting area - these visitors may rotate.    Children under the age of 60 must have an adult with them who is not the patient.  Due to an increase in RSV and influenza rates and associated hospitalizations, children ages 71 and under may not visit patients in [facility] hospitals.  Visitors with respiratory illnesses are discouraged from visiting and should remain at home.  If the patient needs to stay at the hospital during part of their recovery, the visitor guidelines for inpatient rooms apply. Pre-op nurse will coordinate an appropriate time for 1 support person to accompany patient in pre-op.  This support person may not rotate.    Please refer to the [facility] website for the visitor guidelines for Inpatients (after your surgery is over and you are in a regular room).    Your procedure is scheduled on: 02/27/24   Report to [facility] Main Entrance    Report to admitting at 9:00 AM   Call this number if you have problems the morning of surgery [phone]   Follow a clear liquid diet the day before surgery.  Water Non-Citrus Juices (without pulp, NO RED-Apple, White grape, White cranberry) Black Coffee (NO MILK/CREAM OR CREAMERS, sugar ok)  Clear Tea (NO MILK/CREAM OR CREAMERS, sugar ok) regular and decaf                             Plain Jell-O (NO RED)                                           Fruit ices (not with fruit pulp, NO RED)                                     Popsicles (NO RED)                                                               Sports drinks like Gatorade (NO RED)              Nothing to drink after midnight.          If you have questions, please contact your surgeon's office.   FOLLOW BOWEL PREP AND ANY ADDITIONAL PRE OP INSTRUCTIONS YOU RECEIVED FROM YOUR SURGEON'S OFFICE!!!     Oral Hygiene is also important to  reduce your risk of infection.                                    Remember - BRUSH YOUR TEETH THE MORNING OF SURGERY WITH YOUR REGULAR TOOTHPASTE  DENTURES WILL BE REMOVED PRIOR TO SURGERY PLEASE DO NOT APPLY "Poly grip" OR ADHESIVES!!!   Stop all vitamins and herbal supplements 7 days before surgery.   Take these medicines the morning of surgery with A SIP OF WATER: Tylenol, Alprazolam, Levothyroxine, Metoprolol, Gabapentin  You may not have any metal on your body including jewelry, and body piercing             Do not wear lotions, powders, cologne, or deodorant              Men may shave face and neck.   Do not bring valuables to the hospital. [facility] IS NOT             RESPONSIBLE   FOR VALUABLES.   Contacts, glasses, dentures or bridgework may not be worn into surgery.   Bring small overnight bag day of surgery.   DO NOT BRING YOUR HOME MEDICATIONS TO THE HOSPITAL. PHARMACY WILL DISPENSE MEDICATIONS LISTED ON YOUR MEDICATION LIST TO YOU DURING YOUR ADMISSION IN THE HOSPITAL!   Special Instructions: Bring a copy of your healthcare power of attorney and living will documents the day of surgery if you haven't scanned them before.              Please read over the following fact sheets you were given: IF YOU HAVE QUESTIONS ABOUT YOUR PRE-OP INSTRUCTIONS PLEASE CALL [phone]    If you received a COVID test during your pre-op visit  it is requested that you wear a mask when out in public, stay away from anyone that may not be feeling well and notify your surgeon if you develop symptoms. If you test positive for Covid or have been in contact with anyone that has tested positive in the last 10 days please notify you surgeon.     - Preparing for Surgery Before surgery, you can play an important role.  Because skin is not sterile, your skin needs to be as free of germs as possible.  You can reduce the number of germs on your skin by  washing with CHG (chlorahexidine gluconate) soap before surgery.  CHG is an antiseptic cleaner which kills germs and bonds with the skin to continue killing germs even after washing. Please DO NOT use if you have an allergy to CHG or antibacterial soaps.  If your skin becomes reddened/irritated stop using the CHG and inform your nurse when you arrive at Short Stay. Do not shave (including legs and underarms) for at least 48 hours prior to the first CHG shower.  You may shave your face/neck.  Please follow these instructions carefully:  1.  Shower with CHG Soap the night before surgery and the  morning of surgery.  2.  If you choose to wash your hair, wash your hair first as usual with your normal  shampoo.  3.  After you shampoo, rinse your hair and body thoroughly to remove the shampoo.                             4.  Use CHG as you would any other liquid soap.  You can apply chg directly to the skin and wash.  Gently with a scrungie or clean washcloth.  5.  Apply the CHG Soap to your body ONLY FROM THE NECK DOWN.   Do   not use on face/ open                           Wound or open sores. Avoid contact with eyes, ears mouth and   genitals (private parts).  Wash face,  Genitals (private parts) with your normal soap.             6.  Wash thoroughly, paying special attention to the area where your    surgery  will be performed.  7.  Thoroughly rinse your body with warm water from the neck down.  8.  DO NOT shower/wash with your normal soap after using and rinsing off the CHG Soap.                9.  Pat yourself dry with a clean towel.            10.  Wear clean pajamas.            11.  Place clean sheets on your bed the night of your first shower and do not  sleep with pets. Day of Surgery : Do not apply any lotions/deodorants the morning of surgery.  Please wear clean clothes to the hospital/surgery center.  FAILURE TO FOLLOW THESE INSTRUCTIONS MAY RESULT IN THE CANCELLATION  OF YOUR SURGERY  PATIENT SIGNATURE_________________________________  NURSE SIGNATURE__________________________________  ________________________________________________________________________ WHAT IS A BLOOD TRANSFUSION? Blood Transfusion Information  A transfusion is the replacement of blood or some of its parts. Blood is made up of multiple cells which provide different functions. Red blood cells carry oxygen and are used for blood loss replacement. White blood cells fight against infection. Platelets control bleeding. Plasma helps clot blood. Other blood products are available for specialized needs, such as hemophilia or other clotting disorders. BEFORE THE TRANSFUSION  Who gives blood for transfusions?  Healthy volunteers who are fully evaluated to make sure their blood is safe. This is blood bank blood. Transfusion therapy is the safest it has ever been in the practice of medicine. Before blood is taken from a donor, a complete history is taken to make sure that person has no history of diseases nor engages in risky social behavior (examples are intravenous drug use or sexual activity with multiple partners). The donor's travel history is screened to minimize risk of transmitting infections, such as malaria. The donated blood is tested for signs of infectious diseases, such as HIV and hepatitis. The blood is then tested to be sure it is compatible with you in order to minimize the chance of a transfusion reaction. If you or a relative donates blood, this is often done in anticipation of surgery and is not appropriate for emergency situations. It takes many days to process the donated blood. RISKS AND COMPLICATIONS Although transfusion therapy is very safe and saves many lives, the main dangers of transfusion include:  Getting an infectious disease. Developing a transfusion reaction. This is an allergic reaction to something in the blood you were given. Every precaution is taken to prevent  this. The decision to have a blood transfusion has been considered carefully by your caregiver before blood is given. Blood is not given unless the benefits outweigh the risks. AFTER THE TRANSFUSION Right after receiving a blood transfusion, you will usually feel much better and more energetic. This is especially true if your red blood cells have gotten low (anemic). The transfusion raises the level of the red blood cells which carry oxygen, and this usually causes an energy increase. The nurse administering the transfusion will monitor you carefully for complications. HOME CARE INSTRUCTIONS  No special instructions are needed after a transfusion. You may find your energy is better. Speak with your caregiver about any limitations on activity for underlying diseases you may  have. SEEK MEDICAL CARE IF:  Your condition is not improving after your transfusion. You develop redness or irritation at the intravenous (IV) site. SEEK IMMEDIATE MEDICAL CARE IF:  Any of the following symptoms occur over the next 12 hours: Shaking chills. You have a temperature by mouth above 102 F (38.9 C), not controlled by medicine. Chest, back, or muscle pain. People around you feel you are not acting correctly or are confused. Shortness of breath or difficulty breathing. Dizziness and fainting. You get a rash or develop hives. You have a decrease in urine output. Your urine turns a dark color or changes to pink, red, or brown. Any of the following symptoms occur over the next 10 days: You have a temperature by mouth above 102 F (38.9 C), not controlled by medicine. Shortness of breath. Weakness after normal activity. The white part of the eye turns yellow (jaundice). You have a decrease in the amount of urine or are urinating less often. Your urine turns a dark color or changes to pink, red, or brown. Document Released: 11/23/2000 Document Revised: 02/18/2012 Document Reviewed: 07/12/2008 [facility] Patient  Information 2014 [place], [place].  _______________________________________________________________________

## 2024-02-20 NOTE — Telephone Encounter (Signed)
 error

## 2024-02-20 NOTE — Telephone Encounter (Deleted)
 Pharmacy Patient Advocate Encounter   Received notification from CoverMyMeds that prior authorization for Repatha is required/requested.   Insurance verification completed.   The patient is insured through  [employer]  .   Per test claim: PA required; PA submitted to above mentioned insurance via CoverMyMeds Key/confirmation #/EOC [id number] Status is pending

## 2024-02-20 NOTE — Progress Notes (Signed)
 COVID Vaccine Completed: yes  Date of COVID positive in last 90 days:  PCP - Dina Rich, MD Cardiologist - Norman Herrlich, MD  Cardiac clearance by Tereso Newcomer, PA 02/17/24 in Epic   PET- 12/05/23 Epic Chest x-ray -  EKG - 07/02/23 Epic Stress Test - 2017 [facility] ECHO - 03/27/16 [facility] Cardiac Cath - 08/09/22 Epic Pacemaker/ICD device last checked: Spinal Cord Stimulator:  Bowel Prep -   Sleep Study -  CPAP -   Fasting Blood Sugar - preDM Checks Blood Sugar _____ times a day  Last dose of GLP1 agonist-  N/A GLP1 instructions:  Hold 7 days before surgery    Last dose of SGLT-2 inhibitors-  N/A SGLT-2 instructions:  Hold 3 days before surgery    Blood Thinner Instructions: Plavix Aspirin Instructions: Last Dose:  Activity level:  Can go up a flight of stairs and perform activities of daily living without stopping and without symptoms of chest pain or shortness of breath.  Able to exercise without symptoms  Unable to go up a flight of stairs without symptoms of     Anesthesia review: PFO, CAD, HTN, OSA, CKD  Patient denies shortness of breath, fever, cough and chest pain at PAT appointment  Patient verbalized understanding of instructions that were given to them at the PAT appointment. Patient was also instructed that they will need to review over the PAT instructions again at home before surgery.

## 2024-02-21 ENCOUNTER — Other Ambulatory Visit: Payer: Self-pay

## 2024-02-21 ENCOUNTER — Encounter (HOSPITAL_COMMUNITY)
Admission: RE | Admit: 2024-02-21 | Discharge: 2024-02-21 | Disposition: A | Discharge status: Home / Self Care | Payer: BC Managed Care – PPO | Source: Ambulatory Visit | Attending: Urology | Admitting: Urology

## 2024-02-21 ENCOUNTER — Encounter (HOSPITAL_COMMUNITY): Payer: Self-pay

## 2024-02-21 DIAGNOSIS — I129 Hypertensive chronic kidney disease with stage 1 through stage 4 chronic kidney disease, or unspecified chronic kidney disease: Secondary | ICD-10-CM | POA: Insufficient documentation

## 2024-02-21 DIAGNOSIS — I2510 Atherosclerotic heart disease of native coronary artery without angina pectoris: Secondary | ICD-10-CM | POA: Insufficient documentation

## 2024-02-21 DIAGNOSIS — Z01812 Encounter for preprocedural laboratory examination: Secondary | ICD-10-CM | POA: Insufficient documentation

## 2024-02-21 DIAGNOSIS — Z7902 Long term (current) use of antithrombotics/antiplatelets: Secondary | ICD-10-CM | POA: Insufficient documentation

## 2024-02-21 DIAGNOSIS — N183 Chronic kidney disease, stage 3 unspecified: Secondary | ICD-10-CM | POA: Diagnosis not present

## 2024-02-21 DIAGNOSIS — C61 Malignant neoplasm of prostate: Secondary | ICD-10-CM | POA: Diagnosis not present

## 2024-02-21 LAB — CBC
HCT: 45.9 % (ref 39.0–52.0)
Hemoglobin: 15.4 g/dL (ref 13.0–17.0)
MCH: 30.4 pg (ref 26.0–34.0)
MCHC: 33.6 g/dL (ref 30.0–36.0)
MCV: 90.5 fL (ref 80.0–100.0)
Platelets: 270 10*3/uL (ref 150–400)
RBC: 5.07 MIL/uL (ref 4.22–5.81)
RDW: 12.7 % (ref 11.5–15.5)
WBC: 9.3 10*3/uL (ref 4.0–10.5)
nRBC: 0 % (ref 0.0–0.2)

## 2024-02-21 LAB — BASIC METABOLIC PANEL
Anion gap: 9 (ref 5–15)
BUN: 23 mg/dL — ABNORMAL HIGH (ref 6–20)
CO2: 24 mmol/L (ref 22–32)
Calcium: 9.6 mg/dL (ref 8.9–10.3)
Chloride: 106 mmol/L (ref 98–111)
Creatinine, Ser: 1.29 mg/dL — ABNORMAL HIGH (ref 0.61–1.24)
GFR, Estimated: >60 mL/min (ref >60)
Glucose, Bld: 130 mg/dL — ABNORMAL HIGH (ref 70–99)
Potassium: 4.7 mmol/L (ref 3.5–5.1)
Sodium: 139 mmol/L (ref 135–145)

## 2024-02-24 DIAGNOSIS — M542 Cervicalgia: Secondary | ICD-10-CM | POA: Diagnosis not present

## 2024-02-24 NOTE — Progress Notes (Addendum)
 Anesthesia Chart Review   Case: [id number] Date/Time: 02/27/24 1100   Procedures:      XI ROBOTIC ASSISTED LAPAROSCOPIC RADICAL PROSTATECTOMY LEVEL 3 - 210 MINUTES     BILATERAL PELVIC LYMPH NODE DISSECTION (Bilateral)   Anesthesia type: General   Pre-op diagnosis: PROSTATE CANCER   Location: [facility] ROOM 03 / [facility]   Surgeons: Heloise Purpura, MD       DISCUSSION:63 y.o. never smoker with h/o HTN, nonobstructive CAD, CKD Stage III, prostate cancer scheduled for above procedure 02/27/2024 with Dr. Heloise Purpura.   Per cardiology preoperative evaluation 02/17/2024, "Mr. Turpen's perioperative risk of a major cardiac event is 0.9% according to the Revised Cardiac Risk Index (RCRI).  Therefore, he is at low risk for perioperative complications.   His functional capacity is good at 4.31 METs according to the Duke Activity Status Index (DASI). Recommendations: According to ACC/AHA guidelines, no further cardiovascular testing needed.  The patient may proceed to surgery at acceptable risk.   Antiplatelet and/or Anticoagulation Recommendations: Clopidogrel (Plavix) can be held for 5 days prior to his surgery and resumed as soon as possible post op."  VS: BP 131/83   Pulse 68   Temp 36.7 C (Oral)   Resp 16   Ht 6\' 3"  (1.905 m)   Wt 95.7 kg   SpO2 98%   BMI 26.37 kg/m   PROVIDERS: Olive Bass, MD is PCP   Cardiologist - Norman Herrlich, MD  LABS: Labs reviewed: Acceptable for surgery. (all labs ordered are listed, but only abnormal results are displayed)  Labs Reviewed  BASIC METABOLIC PANEL - Abnormal; Notable for the following components:      Result Value   Glucose, Bld 130 (*)    BUN 23 (*)    Creatinine, Ser 1.29 (*)    All other components within normal limits  CBC  TYPE AND SCREEN     IMAGES:   EKG:   CV: Cardiac Cath 08/09/2022   Mid LAD lesion is 40% stenosed.   LV end diastolic pressure is mildly elevated.  LVEDP 20 mm Hg.   There is no aortic valve stenosis.    Mild, nonobstructive coronary artery disease.  Very large, dominant right coronary artery which feeds the apex.  LAD and its branches are generally small and diffusely diseased.  Continue medical therapy.  We spoke about the importance of healthy diet, regular exercise. Past Medical History:  Diagnosis Date   Allergic rhinitis    Anticoagulant long-term use    plavix---   managed by cardiology   Anxiety disorder 2018   Arthritis    neck   Atypical chest pain 09/25/2018   followed by cardiology-- dr Dulce Sellar;  continue's to monitor for long history per dr Dulce Sellar note pt had cardiac cath in 2008 w/ luminal irregularity;  normal stress echo 04/ 2017;   Benign hypertension 01/19/2016   Bladder tumor 05/2023   BPH associated with nocturia    CAD in native artery 12/01/2018   cardiologist--- dr Dulce Sellar;   coronary CT FFR/ morph 11-14-2018  calcium score=514 involving RCA/ LAD/ PDA/ PL;   cardiac cath 08-09-2022  nonob CAD mild LAD 40%   Cancer (HCC)    bladder   Chronic kidney disease, stage 3 (HCC) 04/19/2017   folllowed by pcp   ETD (Eustachian tube dysfunction), right    GERD (gastroesophageal reflux disease)    History of anal fissures 04/10/2016   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 Christell Constant (note in care everywhere )---  06/ 2024 parotid right greater than left ,  resolved without intervention   Prostate nodule 04/25/2023   Right lower quadrant abdominal pain 05/09/2023   Subclinical hypothyroidism    Tinnitus of both ears 11/26/2018   Wears glasses     Past Surgical  History:  Procedure Laterality Date   ANAL FISSURE REPAIR  07/06/2016   @[facility]  BY DR Alveda Reasons;   ENDORECTAL ADVANCEMENT FLAP 07-06-2016  AND RE-DO 10-09-2016   CARDIAC CATHETERIZATION  2008   per pt's cardiologist-- dr Dulce Sellar-- note , luminal irregurilities   CHOLECYSTECTOMY, LAPAROSCOPIC  2008   COLONOSCOPY  2016   COLOSTOMY TAKEDOWN  06/02/2018   @[facility] ;   ILEOSTOMY TAKEDOWN W/ ANORECTAL EUA   EXPLORATORY LAPAROTOMY W/ BOWEL RESECTION  06/28/2017   @[facility] ;   SMALL BOWEL RESECTION W/ ILEOSTOMY REVISION   EXTRACORPOREAL SHOCK WAVE LITHOTRIPSY  2006   LAPAROSCOPIC APPENDECTOMY  2000   LAPAROSCOPIC LOOP COLOSTOMY  06/07/2017   @[facility]  BY DR S. FULLER;   LOOP ILEOSTOMY CREATION   LEFT HEART CATH AND CORONARY ANGIOGRAPHY N/A 08/09/2022   Procedure: LEFT HEART CATH AND CORONARY ANGIOGRAPHY;  Surgeon: Corky Crafts, MD;  Location: [facility];  Service: Cardiovascular;  Laterality: N/A;   PLACEMENT OF SETON     @[facility] ;   08-29-2016  AND 11-05-2016   PROSTATE BIOPSY     RECTAL EXAM UNDER ANESTHESIA  03/07/2018   @[facility]    TONSILLECTOMY AND ADENOIDECTOMY     child   TOTAL HIP ARTHROPLASTY Bilateral 2011   6 months apart same year   TRANSRECTAL ULTRASOUND N/A 11/19/2023   Procedure: TRANSRECTAL ULTRASOUND WITH PROSTATE BIOPSY;  Surgeon: Despina Arias, MD;  Location: [facility];  Service: Urology;  Laterality: N/A;   TRANSURETHRAL RESECTION OF BLADDER TUMOR WITH MITOMYCIN-C N/A 06/26/2023   Procedure: TRANSURETHRAL RESECTION OF BLADDER TUMOR WITH INTRAOPERATIVE INSTILLATION OF GEMCITABINE;  Surgeon: Despina Arias, MD;  Location: [facility];  Service: Urology;  Laterality: N/A;  75 MINUTES NEEDED FOR CASE   TURBINATE REDUCTION  05/03/2023   @[facility]  by dr d. Christell Constant;   BILATERAL INFERIOR    MEDICATIONS:  gabapentin (NEURONTIN) 100 MG capsule   traMADol (ULTRAM) 50 MG tablet   acetaminophen (TYLENOL) 500 MG tablet   ALPRAZolam (XANAX) 0.25 MG  tablet   clopidogrel (PLAVIX) 75 MG tablet   Evolocumab (REPATHA SURECLICK) 140 MG/ML SOAJ   ibuprofen (ADVIL) 200 MG tablet   lansoprazole (PREVACID) 15 MG capsule   levothyroxine (SYNTHROID) 75 MCG tablet   metoprolol succinate (TOPROL-XL) 25 MG 24 hr tablet   nitroGLYCERIN (NITROSTAT) 0.4 MG SL tablet   olmesartan (BENICAR) 20 MG tablet   No current facility-administered medications for this encounter.     Jodell Cipro Ward, PA-C [facility] [phone]

## 2024-02-26 NOTE — H&P (Signed)
 Office Visit Report     02/11/2024   --------------------------------------------------------------------------------   Lee Stevens  MRN: [id number]  DOB: Apr 19, 1961, 63 year old Male  SSN:    PRIMARY CARE:  Robert L. Sol Passer, MD  PRIMARY CARE FAX:  [phone]  REFERRING:  Alen Blew. Lafonda Mosses, MD  PROVIDER:  Alen Blew. Lafonda Mosses, M.D.  TREATING:  Lee Stevens, M.D.  LOCATION:  [facility] [phone]     --------------------------------------------------------------------------------   CC/HPI: CC: Prostate Cancer   Mr. Lee Stevens is a 63 year old gentleman followed by Dr. Lafonda Mosses. He was incidentally found to have a bladder mass on imaging prompting urologic evaluation. He was diagnosed with a 2.5 cm bladder mass and underwent TURBT by Dr. Lafonda Mosses on 06/26/23 revealing a low grade, Ta urothelial carcinoma. Muscularis propria was not noted in the specimen. He underwent cystoscopy last in October with no recurrences noted.   He was also found to have a firm right sided area of induration on his prostate exam. His PSA was 1.35. He underwent a TRUS biopsy of the prostate by Dr. Lafonda Mosses on 11/19/23 and this confirmed Gleason 4+3=7 adenocarcinoma with 8 out of 12 biopsy cores positive for malignancy.   Family history: None.   Imaging studies: PSMA PET scan (11/25/23) - No metastatic disease.   PMH: He has a history of CAD (on Plavix but no stent), hypertension, hypothyroidism, GERD, and bladder cancer. He is followed by Dr. Dulce Stevens in cardiology. He has undergone a cardiac catheterization but has not required intervention or stent placement. He apparently was on aspirin 81 mg preventatively but switched to Plavix for cardiac prevention after he was developing some tinnitus in his ear and it was felt that aspirin may be contributory.  PSH: He had an anal fistula with multiple attempts at transanal repair that failed. He eventually underwent exploratory laparotomy and ileostomy with  subsequent takedown, open appendectomy, laparoscopic cholecystectomy. He does have a right lower quadrant incision as well as a midline incision.   TNM stage: cT2b N0 M0 (right lateral prostate)  PSA: 1.35  Gleason score: 4+3=7 (GG 3)  Biopsy (11/19/23): 8/12 cores positive  Left: L lateral mid (<5%, 3+3=6), L mid (5%, 3+3=6), L lateral base (20%, 3+4=7), L base (20%, 4+3=7)  Right: R mid (30%, 4+3=7), R lateral mid (40%, 3+4=7), R base (70%, 4+3=7), R lateral base (20%, 4+3=7)  Prostate volume: 13.7 cc   Nomogram  OC disease: 39%  EPE: 59%  SVI: 10%  LNI: 15%  PFS (5 year, 10 year): 73%, 59%   Urinary function: IPSS is 3.  Erectile function: SHIM score is 25.   Interval history:   [name] returns today. He has made the decision to proceed with surgical treatment of his prostate cancer. He follows up today to review his recent MRI as well as to undergo surveillance cystoscopy for his history of bladder cancer. He denies any recent hematuria. He does have his preoperative cardiac risk assessment set up for a telehealth visit with cardiology early next week.     ALLERGIES: Keflex - Trouble Breathing    MEDICATIONS: Metoprolol Succinate 25 mg tablet, extended release 24 hr  Plavix 75 mg tablet  Benicar 20 mg tablet  Levothyroxine 75Mg   Multivitamin  Prevacid 15 mg tablet,disintegrating, delayed release     GU PSH: Cystoscopy - 10/10/2023, 06/07/2023       PSH Notes: Nasal Surgery - 2024  Ileostomy - 2019    NON-GU PSH: Appendectomy Cholecystectomy (laparoscopic)  Hip Replacement - about 2015 Visit Complexity (formerly GPC1X) - 10/10/2023     GU PMH: Prostate Cancer - 02/05/2024, - 01/07/2024, - 12/13/2023, - 11/25/2023 Bladder Cancer Anterior - 12/13/2023, - 10/10/2023, - 07/04/2023 Pelvic/perineal pain - 12/13/2023 Prostate Disorder, Unspec - 11/25/2023, - 10/10/2023 Weak Urinary Stream - 07/04/2023 Bladder tumor/neoplasm - 06/07/2023 Gross hematuria - 06/07/2023     NON-GU PMH: Muscle weakness (generalized) - 02/05/2024 Other muscle spasm - 02/05/2024 Coronary Artery Disease GERD Hypercholesterolemia Hypertension    FAMILY HISTORY: 2 daughters - Daughter 1 son - Son Death In The Family Father - Father Kidney Stones - Father Myocardial Infarction - Father   SOCIAL HISTORY: Marital Status: Married Preferred Language: English; Ethnicity: Not Hispanic Or Latino; Race: White Current Smoking Status: Patient has never smoked.   Tobacco Use Assessment Completed: Used Tobacco in last 30 days? Does not use smokeless tobacco. Has never drank.  Does not drink caffeine.    REVIEW OF SYSTEMS:    GU Review Male:   Patient denies frequent urination, hard to postpone urination, burning/ pain with urination, get up at night to urinate, leakage of urine, stream starts and stops, trouble starting your streams, and have to strain to urinate .  Gastrointestinal (Lower):   Patient denies diarrhea and constipation.  Gastrointestinal (Upper):   Patient denies nausea and vomiting.  Constitutional:   Patient denies fever, night sweats, weight loss, and fatigue.  Skin:   Patient denies skin rash/ lesion and itching.  Eyes:   Patient denies blurred vision and double vision.  Ears/ Nose/ Throat:   Patient denies sore throat and sinus problems.  Hematologic/Lymphatic:   Patient denies swollen glands and easy bruising.  Cardiovascular:   Patient denies leg swelling and chest pains.  Respiratory:   Patient denies cough and shortness of breath.  Endocrine:   Patient denies excessive thirst.  Musculoskeletal:   Patient denies back pain and joint pain.  Neurological:   Patient denies headaches and dizziness.  Psychologic:   Patient denies depression and anxiety.   VITAL SIGNS: None   GU PHYSICAL EXAMINATION:    Urethral Meatus: Normal size. No lesion, no wart, no discharge, no polyp. Normal location.   MULTI-SYSTEM PHYSICAL EXAMINATION:    Constitutional:  Well-nourished. No physical deformities. Normally developed. Good grooming.  Respiratory: No labored breathing, no use of accessory muscles.   Cardiovascular: Normal temperature, normal extremity pulses, no swelling, no varicosities.  Gastrointestinal: He has a well-healed midline periumbilical incision as well as an ileostomy scar and right lower quadrant scar from his appendectomy.     Complexity of Data:  Lab Test Review:   PSA  Records Review:   Pathology Reports, Previous Patient Records  X-Ray Review: MRI Prostate GSORAD: Reviewed Films.     PROCEDURES:         Flexible Cystoscopy - 52000  Indication: Bladder cancer Risks, benefits, and potential complications of the procedure were discussed with the patient including infection, bleeding, voiding discomfort, urinary retention, fever, chills, sepsis, and others. All questions were answered. Informed consent was obtained. Sterile technique and intraurethral analgesia were used.  Meatus:  Normal size. Normal location. Normal condition.  Urethra:  No strictures.  External Sphincter:  Normal.  Verumontanum:  Normal.  Prostate:  Non-obstructing. No hyperplasia.  Bladder Neck:  Non-obstructing.  Ureteral Orifices:  Normal location. Normal size. Normal shape. Effluxed clear urine.  Bladder:  No trabeculation. No tumors. Normal mucosa. No stones.      Chaperone: SM The procedure was well-tolerated and  without complications. Instructions were given to call the office immediately if questions or problems.         Visit Complexity - G2211          Urinalysis - 81003 Dipstick Dipstick Cont'd  Color: Yellow Bilirubin: Neg  Appearance: Clear Ketones: Neg  Specific Gravity: 1.015 Blood: Neg  pH: <=5.0 Protein: Neg  Glucose: Neg Urobilinogen: 0.2    Nitrites: Neg    Leukocyte Esterase: Neg    Notes:      ASSESSMENT:      ICD-10 Details  1 GU:   Prostate Cancer - C61   2   History of bladder cancer - Z85.51    PLAN:            Schedule Return Visit/Planned Activity: Keep Scheduled Appointment          Document Letter(s):  Created for Patient: Clinical Summary         Notes:   1. Unfavorable intermediate risk prostate cancer: He affirms his decision to proceed with surgical treatment of his prostate cancer. He does have his cardiac risk assessment scheduled for next week. We reviewed his MRI which does not demonstrate any clear extra prostatic extension and we will plan to proceed with a bilateral nerve sparing procedure with possible partial nerve sparing on the right side. He will stop his Plavix 5 days prior to surgery.   2. Bladder cancer: No evidence for cystoscopic recurrence. Follow-up in 1 year for ongoing surveillance cystoscopy.   CC: Dr. Dina Rich  Dr. Irine Seal  Dr. Margaretmary Dys           Next Appointment:      Next Appointment: 02/27/2024 11:30 AM    Appointment Type: Surgery     Location: [facility] [phone]    Provider: Heloise Stevens, M.D.    Reason for Visit: [facility] -- [facility] AND [facility]      * Signed by Lee Stevens, M.D. on 02/12/24 at 3:55 PM (EST*

## 2024-02-27 ENCOUNTER — Encounter (HOSPITAL_COMMUNITY): Payer: Self-pay | Admitting: Urology

## 2024-02-27 ENCOUNTER — Encounter (HOSPITAL_COMMUNITY)
Admission: RE | Disposition: A | Discharge status: Home / Self Care | Payer: Self-pay | Source: Home / Self Care | Attending: Urology

## 2024-02-27 ENCOUNTER — Observation Stay (HOSPITAL_COMMUNITY)
Admission: RE | Admit: 2024-02-27 | Discharge: 2024-02-28 | Disposition: A | Discharge status: Home / Self Care | Payer: BC Managed Care – PPO | Attending: Urology | Admitting: Urology

## 2024-02-27 ENCOUNTER — Ambulatory Visit (HOSPITAL_COMMUNITY): Admitting: Anesthesiology

## 2024-02-27 ENCOUNTER — Ambulatory Visit (HOSPITAL_COMMUNITY): Payer: Self-pay | Admitting: Physician Assistant

## 2024-02-27 ENCOUNTER — Other Ambulatory Visit: Payer: Self-pay

## 2024-02-27 ENCOUNTER — Other Ambulatory Visit: Payer: Self-pay | Admitting: Cardiology

## 2024-02-27 DIAGNOSIS — Z8551 Personal history of malignant neoplasm of bladder: Secondary | ICD-10-CM | POA: Insufficient documentation

## 2024-02-27 DIAGNOSIS — N183 Chronic kidney disease, stage 3 unspecified: Secondary | ICD-10-CM | POA: Diagnosis not present

## 2024-02-27 DIAGNOSIS — Z7902 Long term (current) use of antithrombotics/antiplatelets: Secondary | ICD-10-CM | POA: Insufficient documentation

## 2024-02-27 DIAGNOSIS — Z79899 Other long term (current) drug therapy: Secondary | ICD-10-CM | POA: Diagnosis not present

## 2024-02-27 DIAGNOSIS — Z8616 Personal history of COVID-19: Secondary | ICD-10-CM | POA: Insufficient documentation

## 2024-02-27 DIAGNOSIS — C61 Malignant neoplasm of prostate: Principal | ICD-10-CM | POA: Diagnosis present

## 2024-02-27 DIAGNOSIS — I2510 Atherosclerotic heart disease of native coronary artery without angina pectoris: Secondary | ICD-10-CM | POA: Insufficient documentation

## 2024-02-27 DIAGNOSIS — I129 Hypertensive chronic kidney disease with stage 1 through stage 4 chronic kidney disease, or unspecified chronic kidney disease: Secondary | ICD-10-CM | POA: Insufficient documentation

## 2024-02-27 DIAGNOSIS — E039 Hypothyroidism, unspecified: Secondary | ICD-10-CM | POA: Diagnosis not present

## 2024-02-27 DIAGNOSIS — I10 Essential (primary) hypertension: Secondary | ICD-10-CM | POA: Diagnosis not present

## 2024-02-27 HISTORY — DX: Malignant neoplasm of prostate: C61

## 2024-02-27 HISTORY — PX: ROBOT ASSISTED LAPAROSCOPIC RADICAL PROSTATECTOMY: SHX5141

## 2024-02-27 HISTORY — PX: PELVIC LYMPH NODE DISSECTION: SHX6543

## 2024-02-27 LAB — ABO/RH: ABO/RH(D): O POS

## 2024-02-27 LAB — TYPE AND SCREEN
ABO/RH(D): O POS
Antibody Screen: NEGATIVE

## 2024-02-27 LAB — HEMOGLOBIN AND HEMATOCRIT, BLOOD
HCT: 39.1 % (ref 39.0–52.0)
Hemoglobin: 13.5 g/dL (ref 13.0–17.0)

## 2024-02-27 SURGERY — XI ROBOTIC ASSISTED LAPAROSCOPIC RADICAL PROSTATECTOMY LEVEL 3
Anesthesia: General

## 2024-02-27 MED ORDER — DIPHENHYDRAMINE HCL 12.5 MG/5ML PO ELIX: 12.5000 mg | ORAL_SOLUTION | Freq: Four times a day (QID) | ORAL | Status: DC | PRN

## 2024-02-27 MED ORDER — HYDROMORPHONE HCL 1 MG/ML IJ SOLN
0.2500 mg | INTRAMUSCULAR | Status: DC | PRN
  Administered 2024-02-27 (×4): 0.5 mg via INTRAVENOUS

## 2024-02-27 MED ORDER — OXYCODONE HCL 5 MG/5ML PO SOLN: 5.0000 mg | Freq: Once | ORAL | Status: AC | PRN

## 2024-02-27 MED ORDER — STERILE WATER FOR IRRIGATION IR SOLN
Status: DC | PRN
  Administered 2024-02-27: 1000 mL

## 2024-02-27 MED ORDER — SULFAMETHOXAZOLE-TRIMETHOPRIM 800-160 MG PO TABS: 1.0000 | ORAL_TABLET | Freq: Two times a day (BID) | ORAL | 0 refills | Status: DC

## 2024-02-27 MED ORDER — OXYCODONE HCL 5 MG PO TABS
ORAL_TABLET | ORAL | Status: AC
  Filled 2024-02-27: qty 1

## 2024-02-27 MED ORDER — ACETAMINOPHEN 10 MG/ML IV SOLN
INTRAVENOUS | Status: AC
  Filled 2024-02-27: qty 100

## 2024-02-27 MED ORDER — BUPIVACAINE-EPINEPHRINE (PF) 0.25% -1:200000 IJ SOLN
INTRAMUSCULAR | Status: DC | PRN
  Administered 2024-02-27: 30 mL

## 2024-02-27 MED ORDER — OXYCODONE HCL 5 MG PO TABS
5.0000 mg | ORAL_TABLET | Freq: Once | ORAL | Status: AC | PRN
  Administered 2024-02-27: 5 mg via ORAL

## 2024-02-27 MED ORDER — MORPHINE SULFATE (PF) 2 MG/ML IV SOLN: 2.0000 mg | INTRAVENOUS | Status: DC | PRN

## 2024-02-27 MED ORDER — EPHEDRINE SULFATE-NACL 50-0.9 MG/10ML-% IV SOSY
PREFILLED_SYRINGE | INTRAVENOUS | Status: DC | PRN
  Administered 2024-02-27 (×5): 10 mg via INTRAVENOUS

## 2024-02-27 MED ORDER — VANCOMYCIN HCL IN DEXTROSE 1-5 GM/200ML-% IV SOLN
1000.0000 mg | Freq: Two times a day (BID) | INTRAVENOUS | Status: AC
  Administered 2024-02-28: 1000 mg via INTRAVENOUS
  Filled 2024-02-27: qty 200

## 2024-02-27 MED ORDER — SUGAMMADEX SODIUM 200 MG/2ML IV SOLN
INTRAVENOUS | Status: AC
  Filled 2024-02-27: qty 4

## 2024-02-27 MED ORDER — ALPRAZOLAM 0.25 MG PO TABS: 0.2500 mg | ORAL_TABLET | Freq: Every day | ORAL | Status: DC | PRN

## 2024-02-27 MED ORDER — BUPIVACAINE-EPINEPHRINE (PF) 0.25% -1:200000 IJ SOLN
INTRAMUSCULAR | Status: AC
  Filled 2024-02-27: qty 30

## 2024-02-27 MED ORDER — KETOROLAC TROMETHAMINE 15 MG/ML IJ SOLN
INTRAMUSCULAR | Status: AC
  Filled 2024-02-27: qty 1

## 2024-02-27 MED ORDER — MIDAZOLAM HCL 2 MG/2ML IJ SOLN
INTRAMUSCULAR | Status: DC | PRN
  Administered 2024-02-27: 2 mg via INTRAVENOUS

## 2024-02-27 MED ORDER — ROCURONIUM BROMIDE 100 MG/10ML IV SOLN
INTRAVENOUS | Status: DC | PRN
  Administered 2024-02-27: 70 mg via INTRAVENOUS
  Administered 2024-02-27: 10 mg via INTRAVENOUS
  Administered 2024-02-27: 20 mg via INTRAVENOUS
  Administered 2024-02-27 (×2): 10 mg via INTRAVENOUS

## 2024-02-27 MED ORDER — SODIUM CHLORIDE 0.9 % IV BOLUS
1000.0000 mL | Freq: Once | INTRAVENOUS | Status: AC
  Administered 2024-02-27: 1000 mL via INTRAVENOUS

## 2024-02-27 MED ORDER — FENTANYL CITRATE (PF) 100 MCG/2ML IJ SOLN
INTRAMUSCULAR | Status: AC
  Filled 2024-02-27: qty 2

## 2024-02-27 MED ORDER — CELECOXIB 200 MG PO CAPS
200.0000 mg | ORAL_CAPSULE | Freq: Once | ORAL | Status: AC
  Administered 2024-02-27: 200 mg via ORAL
  Filled 2024-02-27: qty 1

## 2024-02-27 MED ORDER — KETOROLAC TROMETHAMINE 15 MG/ML IJ SOLN
15.0000 mg | Freq: Four times a day (QID) | INTRAMUSCULAR | Status: DC
  Administered 2024-02-27 – 2024-02-28 (×3): 15 mg via INTRAVENOUS
  Filled 2024-02-27 (×2): qty 1

## 2024-02-27 MED ORDER — TRIPLE ANTIBIOTIC 3.5-400-5000 EX OINT: 1.0000 | TOPICAL_OINTMENT | Freq: Three times a day (TID) | CUTANEOUS | Status: DC | PRN

## 2024-02-27 MED ORDER — DROPERIDOL 2.5 MG/ML IJ SOLN
INTRAMUSCULAR | Status: AC
Start: 2024-02-27 — End: 2024-02-28
  Filled 2024-02-27: qty 2

## 2024-02-27 MED ORDER — ONDANSETRON HCL 4 MG/2ML IJ SOLN
INTRAMUSCULAR | Status: AC
  Filled 2024-02-27: qty 2

## 2024-02-27 MED ORDER — PROPOFOL 10 MG/ML IV BOLUS
INTRAVENOUS | Status: AC
  Filled 2024-02-27: qty 20

## 2024-02-27 MED ORDER — DEXAMETHASONE SODIUM PHOSPHATE 10 MG/ML IJ SOLN
INTRAMUSCULAR | Status: AC
  Filled 2024-02-27: qty 1

## 2024-02-27 MED ORDER — LACTATED RINGERS IV SOLN: INTRAVENOUS | Status: DC

## 2024-02-27 MED ORDER — SUGAMMADEX SODIUM 200 MG/2ML IV SOLN
INTRAVENOUS | Status: DC | PRN
Start: 2024-02-27 — End: 2024-02-27
  Administered 2024-02-27: 200 mg via INTRAVENOUS
  Administered 2024-02-27: 25 mg via INTRAVENOUS

## 2024-02-27 MED ORDER — NITROGLYCERIN 0.4 MG SL SUBL: 0.4000 mg | SUBLINGUAL_TABLET | SUBLINGUAL | Status: DC | PRN

## 2024-02-27 MED ORDER — HYDROMORPHONE HCL 1 MG/ML IJ SOLN
INTRAMUSCULAR | Status: AC
Start: 2024-02-27 — End: 2024-02-28
  Filled 2024-02-27: qty 2

## 2024-02-27 MED ORDER — ACETAMINOPHEN 10 MG/ML IV SOLN
1000.0000 mg | Freq: Four times a day (QID) | INTRAVENOUS | Status: DC
  Administered 2024-02-27: 1000 mg via INTRAVENOUS

## 2024-02-27 MED ORDER — MIDAZOLAM HCL 2 MG/2ML IJ SOLN
INTRAMUSCULAR | Status: AC
  Filled 2024-02-27: qty 2

## 2024-02-27 MED ORDER — DOCUSATE SODIUM 100 MG PO CAPS
100.0000 mg | ORAL_CAPSULE | Freq: Two times a day (BID) | ORAL | Status: DC
  Administered 2024-02-28 (×2): 100 mg via ORAL
  Filled 2024-02-27 (×2): qty 1

## 2024-02-27 MED ORDER — CHLORHEXIDINE GLUCONATE 0.12 % MT SOLN
15.0000 mL | Freq: Once | OROMUCOSAL | Status: AC
  Administered 2024-02-27: 15 mL via OROMUCOSAL

## 2024-02-27 MED ORDER — ACETAMINOPHEN 325 MG PO TABS: 650.0000 mg | ORAL_TABLET | ORAL | Status: DC | PRN

## 2024-02-27 MED ORDER — ORAL CARE MOUTH RINSE: 15.0000 mL | Freq: Once | OROMUCOSAL | Status: AC

## 2024-02-27 MED ORDER — IRBESARTAN 150 MG PO TABS
150.0000 mg | ORAL_TABLET | Freq: Every day | ORAL | Status: DC
  Administered 2024-02-28: 150 mg via ORAL
  Filled 2024-02-27: qty 1

## 2024-02-27 MED ORDER — TRAMADOL HCL 50 MG PO TABS
50.0000 mg | ORAL_TABLET | Freq: Four times a day (QID) | ORAL | 0 refills | Status: DC | PRN
Start: 2024-02-27 — End: 2024-09-01

## 2024-02-27 MED ORDER — DROPERIDOL 2.5 MG/ML IJ SOLN
0.6250 mg | Freq: Once | INTRAMUSCULAR | Status: AC
  Administered 2024-02-27: 0.625 mg via INTRAVENOUS

## 2024-02-27 MED ORDER — PROPOFOL 10 MG/ML IV BOLUS
INTRAVENOUS | Status: DC | PRN
  Administered 2024-02-27: 200 mg via INTRAVENOUS

## 2024-02-27 MED ORDER — MORPHINE SULFATE (PF) 4 MG/ML IV SOLN
INTRAVENOUS | Status: AC
  Administered 2024-02-27: 2 mg via INTRAVENOUS
  Filled 2024-02-27: qty 1

## 2024-02-27 MED ORDER — KCL IN DEXTROSE-NACL 20-5-0.45 MEQ/L-%-% IV SOLN
INTRAVENOUS | Status: AC
  Filled 2024-02-27: qty 1000

## 2024-02-27 MED ORDER — HYOSCYAMINE SULFATE 0.125 MG SL SUBL: 0.1250 mg | SUBLINGUAL_TABLET | Freq: Four times a day (QID) | SUBLINGUAL | Status: DC | PRN

## 2024-02-27 MED ORDER — PANTOPRAZOLE SODIUM 20 MG PO TBEC
20.0000 mg | DELAYED_RELEASE_TABLET | Freq: Every day | ORAL | Status: DC
  Administered 2024-02-28: 20 mg via ORAL
  Filled 2024-02-27: qty 1

## 2024-02-27 MED ORDER — ACETAMINOPHEN 500 MG PO TABS
1000.0000 mg | ORAL_TABLET | Freq: Once | ORAL | Status: AC
  Administered 2024-02-27: 1000 mg via ORAL
  Filled 2024-02-27: qty 2

## 2024-02-27 MED ORDER — LIDOCAINE HCL (PF) 2 % IJ SOLN
INTRAMUSCULAR | Status: DC | PRN
  Administered 2024-02-27: 80 mg via INTRADERMAL

## 2024-02-27 MED ORDER — VANCOMYCIN HCL 1000 MG IV SOLR
INTRAVENOUS | Status: DC | PRN
  Administered 2024-02-27: 1000 mg via INTRAVENOUS

## 2024-02-27 MED ORDER — LEVOTHYROXINE SODIUM 75 MCG PO TABS
75.0000 ug | ORAL_TABLET | Freq: Every day | ORAL | Status: DC
  Administered 2024-02-28: 75 ug via ORAL
  Filled 2024-02-27: qty 1

## 2024-02-27 MED ORDER — ZOLPIDEM TARTRATE 5 MG PO TABS: 5.0000 mg | ORAL_TABLET | Freq: Every evening | ORAL | Status: DC | PRN

## 2024-02-27 MED ORDER — GABAPENTIN 100 MG PO CAPS
100.0000 mg | ORAL_CAPSULE | Freq: Two times a day (BID) | ORAL | Status: DC
  Administered 2024-02-28: 100 mg via ORAL
  Filled 2024-02-27 (×2): qty 1

## 2024-02-27 MED ORDER — METOPROLOL SUCCINATE ER 50 MG PO TB24
25.0000 mg | ORAL_TABLET | Freq: Every day | ORAL | Status: DC
Start: 2024-02-28 — End: 2024-02-28
  Administered 2024-02-28: 25 mg via ORAL
  Filled 2024-02-27: qty 1

## 2024-02-27 MED ORDER — ONDANSETRON HCL 4 MG/2ML IJ SOLN: 4.0000 mg | INTRAMUSCULAR | Status: DC | PRN

## 2024-02-27 MED ORDER — SODIUM CHLORIDE 0.9 % IV SOLN
INTRAVENOUS | Status: DC | PRN
Start: 2024-02-27 — End: 2024-02-27

## 2024-02-27 MED ORDER — DOCUSATE SODIUM 100 MG PO CAPS: 100.0000 mg | ORAL_CAPSULE | Freq: Two times a day (BID) | ORAL | Status: DC

## 2024-02-27 MED ORDER — DIPHENHYDRAMINE HCL 50 MG/ML IJ SOLN: 12.5000 mg | Freq: Four times a day (QID) | INTRAMUSCULAR | Status: DC | PRN

## 2024-02-27 MED ORDER — FENTANYL CITRATE (PF) 100 MCG/2ML IJ SOLN
INTRAMUSCULAR | Status: DC | PRN
  Administered 2024-02-27: 100 ug via INTRAVENOUS
  Administered 2024-02-27: 50 ug via INTRAVENOUS

## 2024-02-27 MED ORDER — ONDANSETRON HCL 4 MG/2ML IJ SOLN
INTRAMUSCULAR | Status: DC | PRN
  Administered 2024-02-27: 4 mg via INTRAVENOUS

## 2024-02-27 MED ORDER — VANCOMYCIN HCL IN DEXTROSE 1-5 GM/200ML-% IV SOLN
1000.0000 mg | Freq: Once | INTRAVENOUS | Status: AC
  Administered 2024-02-27: 1000 mg via INTRAVENOUS
  Filled 2024-02-27: qty 200

## 2024-02-27 MED ORDER — LIDOCAINE HCL (PF) 2 % IJ SOLN
INTRAMUSCULAR | Status: AC
  Filled 2024-02-27: qty 5

## 2024-02-27 MED ORDER — KCL IN DEXTROSE-NACL 20-5-0.45 MEQ/L-%-% IV SOLN
INTRAVENOUS | Status: DC
  Administered 2024-02-27: 150 mL/h via INTRAVENOUS
  Filled 2024-02-27 (×3): qty 1000

## 2024-02-27 MED ORDER — DEXAMETHASONE SODIUM PHOSPHATE 10 MG/ML IJ SOLN
INTRAMUSCULAR | Status: DC | PRN
  Administered 2024-02-27: 5 mg via INTRAVENOUS

## 2024-02-27 SURGICAL SUPPLY — 59 items
APPLICATOR COTTON TIP 6 STRL (MISCELLANEOUS) ×2 IMPLANT
APPLICATOR COTTON TIP 6IN STRL (MISCELLANEOUS) ×2 IMPLANT
BAG COUNTER SPONGE SURGICOUNT (BAG) IMPLANT
CATH FOLEY 2WAY SLVR 18FR 30CC (CATHETERS) ×2 IMPLANT
CATH ROBINSON RED A/P 16FR (CATHETERS) ×2 IMPLANT
CATH ROBINSON RED A/P 8FR (CATHETERS) ×2 IMPLANT
CATH TIEMANN FOLEY 18FR 5CC (CATHETERS) ×2 IMPLANT
CHLORAPREP W/TINT 26 (MISCELLANEOUS) ×2 IMPLANT
CLIP LIGATING HEM O LOK PURPLE (MISCELLANEOUS) ×2 IMPLANT
COVER SURGICAL LIGHT HANDLE (MISCELLANEOUS) ×2 IMPLANT
COVER TIP SHEARS 8 DVNC (MISCELLANEOUS) ×2 IMPLANT
CUTTER ECHEON FLEX ENDO 45 340 (ENDOMECHANICALS) ×2 IMPLANT
DERMABOND ADVANCED .7 DNX12 (GAUZE/BANDAGES/DRESSINGS) ×2 IMPLANT
DRAIN CHANNEL RND F F (WOUND CARE) IMPLANT
DRAPE ARM DVNC X/XI (DISPOSABLE) ×8 IMPLANT
DRAPE COLUMN DVNC XI (DISPOSABLE) ×2 IMPLANT
DRAPE SURG IRRIG POUCH 19X23 (DRAPES) ×2 IMPLANT
DRIVER NDL LRG 8 DVNC XI (INSTRUMENTS) ×4 IMPLANT
DRIVER NDLE LRG 8 DVNC XI (INSTRUMENTS) ×4 IMPLANT
DRSG TEGADERM 4X4.75 (GAUZE/BANDAGES/DRESSINGS) ×2 IMPLANT
ELECT PENCIL ROCKER SW 15FT (MISCELLANEOUS) ×2 IMPLANT
ELECT REM PT RETURN 15FT ADLT (MISCELLANEOUS) ×2 IMPLANT
FORCEPS BPLR LNG DVNC XI (INSTRUMENTS) ×2 IMPLANT
FORCEPS PROGRASP DVNC XI (FORCEP) ×2 IMPLANT
GAUZE SPONGE 4X4 12PLY STRL (GAUZE/BANDAGES/DRESSINGS) ×2 IMPLANT
GLOVE BIO SURGEON STRL SZ 6.5 (GLOVE) ×2 IMPLANT
GLOVE SURG LX STRL 7.5 STRW (GLOVE) ×4 IMPLANT
GOWN STRL REUS W/ TWL XL LVL3 (GOWN DISPOSABLE) ×4 IMPLANT
GOWN STRL SURGICAL XL XLNG (GOWN DISPOSABLE) ×2 IMPLANT
HOLDER FOLEY CATH W/STRAP (MISCELLANEOUS) ×2 IMPLANT
IRRIG SUCT STRYKERFLOW 2 WTIP (MISCELLANEOUS) ×2 IMPLANT
IRRIGATION SUCT STRKRFLW 2 WTP (MISCELLANEOUS) ×2 IMPLANT
IV LACTATED RINGERS 1000ML (IV SOLUTION) ×2 IMPLANT
KIT TURNOVER KIT A (KITS) IMPLANT
NDL SAFETY ECLIPSE 18X1.5 (NEEDLE) IMPLANT
PACK ROBOT UROLOGY CUSTOM (CUSTOM PROCEDURE TRAY) ×2 IMPLANT
PLUG CATH AND CAP STRL 200 (CATHETERS) ×2 IMPLANT
RELOAD STAPLE 45 4.1 GRN THCK (STAPLE) ×2 IMPLANT
SCISSORS LAP 5X45 EPIX DISP (ENDOMECHANICALS) IMPLANT
SCISSORS MNPLR CVD DVNC XI (INSTRUMENTS) ×2 IMPLANT
SEAL UNIV 5-12 XI (MISCELLANEOUS) ×8 IMPLANT
SET CYSTO W/LG BORE CLAMP LF (SET/KITS/TRAYS/PACK) IMPLANT
SET TUBE SMOKE EVAC HIGH FLOW (TUBING) ×2 IMPLANT
SOL ELECTROSURG ANTI STICK (MISCELLANEOUS) ×2 IMPLANT
SOL PREP POV-IOD 4OZ 10% (MISCELLANEOUS) ×2 IMPLANT
SOLUTION ELECTROSURG ANTI STCK (MISCELLANEOUS) ×2 IMPLANT
SPIKE FLUID TRANSFER (MISCELLANEOUS) ×2 IMPLANT
STAPLE RELOAD 45 GRN (STAPLE) ×2 IMPLANT
SUT ETHILON 3 0 PS 1 (SUTURE) ×2 IMPLANT
SUT MNCRL 3 0 RB1 (SUTURE) ×2 IMPLANT
SUT MNCRL 3 0 VIOLET RB1 (SUTURE) ×2 IMPLANT
SUT MNCRL AB 4-0 PS2 18 (SUTURE) ×4 IMPLANT
SUT PDS PLUS AB 0 CT-2 (SUTURE) ×4 IMPLANT
SUT VIC AB 0 CT1 27XBRD ANTBC (SUTURE) ×4 IMPLANT
SUT VIC AB 2-0 SH 27X BRD (SUTURE) ×2 IMPLANT
SUT VIC AB 3-0 SH 27X BRD (SUTURE) IMPLANT
SYR 27GX1/2 1ML LL SAFETY (SYRINGE) ×2 IMPLANT
TROCAR Z THREAD OPTICAL 12X100 (TROCAR) IMPLANT
WATER STERILE IRR 1000ML POUR (IV SOLUTION) ×2 IMPLANT

## 2024-02-27 NOTE — Anesthesia Preprocedure Evaluation (Addendum)
 Anesthesia Evaluation  Patient identified by MRN, date of birth, ID band Patient awake    Reviewed: Allergy & Precautions, NPO status   Airway Mallampati: II  TM Distance: >3 FB Neck ROM: Full    Dental no notable dental hx.    Pulmonary sleep apnea    Pulmonary exam normal        Cardiovascular hypertension, Pt. on medications and Pt. on home beta blockers + CAD   Rhythm:Regular Rate:Normal     Neuro/Psych   Anxiety     negative neurological ROS     GI/Hepatic Neg liver ROS,GERD  Medicated,,  Endo/Other  Hypothyroidism    Renal/GU CRFRenal disease  negative genitourinary   Musculoskeletal  (+) Arthritis , Osteoarthritis,    Abdominal   Peds  Hematology Lab Results      Component                Value               Date                      WBC                      9.3                 02/21/2024                HGB                      15.4                02/21/2024                HCT                      45.9                02/21/2024                MCV                      90.5                02/21/2024                PLT                      270                 02/21/2024              Anesthesia Other Findings   Reproductive/Obstetrics                             Anesthesia Physical Anesthesia Plan  ASA: 3  Anesthesia Plan: General   Post-op Pain Management: Celebrex PO (pre-op)* and Tylenol PO (pre-op)*   Induction: Intravenous  PONV Risk Score and Plan: 2 and Ondansetron, Dexamethasone, Midazolam and Treatment may vary due to age or medical condition  Airway Management Planned: Mask  Additional Equipment: None  Intra-op Plan:   Post-operative Plan: Extubation in OR  Informed Consent: I have reviewed the patients History and Physical, chart, labs and discussed the procedure including the risks, benefits and alternatives for the proposed anesthesia with the patient or  authorized representative  who has indicated his/her understanding and acceptance.     Dental advisory given  Plan Discussed with: CRNA  Anesthesia Plan Comments:        Anesthesia Quick Evaluation

## 2024-02-27 NOTE — Discharge Instructions (Signed)
Activity:  You are encouraged to ambulate frequently (about every hour during waking hours) to help prevent blood clots from forming in your legs or lungs.  However, you should not engage in any heavy lifting (> 10-15 lbs), strenuous activity, or straining. Diet: You should continue a clear liquid diet until passing gas from below.  Once this occurs, you may advance your diet to a soft diet that would be easy to digest (i.e soups, scrambled eggs, mashed potatoes, etc.) for 24 hours just as you would if getting over a bad stomach flu.  If tolerating this diet well for 24 hours, you may then begin eating regular food.  It will be normal to have some amount of bloating, nausea, and abdominal discomfort intermittently. Prescriptions:  You will be provided a prescription for pain medication to take as needed.  If your pain is not severe enough to require the prescription pain medication, you may take Tylenol instead.  You should also take an over the counter stool softener (Colace 100 mg twice daily) to avoid straining with bowel movements as the pain medication may constipate you. Finally, you will also be provided a prescription for an antibiotic to begin the day prior to your return visit in the office for catheter removal. Catheter care: You will be taught how to take care of the catheter by the nursing staff prior to discharge from the hospital.  You may use both a leg bag and the larger bedside bag but it is recommended to at least use the bigger bedside bag at nighttime as the leg bag is small and will fill up overnight and also does not drain as well when lying flat. You may periodically feel a strong urge to void with the catheter in place.  This is a bladder spasm and most often can occur when having a bowel movement or when you are moving around. It is typically self-limited and usually will stop after a few minutes.  You may use some Vaseline or Neosporin around the tip of the catheter to reduce friction  at the tip of the penis. Incisions: You may remove your dressing bandages the 2nd day after surgery.  You most likely will have a few small staples in each of the incisions and once the bandages are removed, the incisions may stay open to air.  You may start showering (not soaking or bathing in water) 48 hours after surgery and the incisions simply need to be patted dry after the shower.  No additional care is needed. What to call us about: You should call the office [phone]) if you develop fever > 101, persistent vomiting, or the catheter stops draining. Also, feel free to call with any other questions you may have and remember the handout that was provided to you as a reference preoperatively which answers many of the common questions that arise after surgery. You may resume aspirin, advil, aleve, vitamins, and supplements 7 days after surgery.  You may resume Plavix 3 days after surgery.

## 2024-02-27 NOTE — Progress Notes (Signed)
 Post-op note  Subjective: The patient is doing well. Reports some moderate abdominal pain. HDS.   Objective: Vital signs in last 24 hours: Temp:  [97.8 F (36.6 C)] 97.8 F (36.6 C) (03/20 0853) Pulse Rate:  [57-66] 66 (03/20 1600) Resp:  [10-16] 11 (03/20 1615) BP: (140-141)/(73-88) 141/73 (03/20 1600) SpO2:  [97 %] 97 % (03/20 1600) Weight:  [95.7 kg] 95.7 kg (03/20 0855)  Intake/Output from previous day: No intake/output data recorded. Intake/Output this shift: Total I/O In: 1450 [I.V.:1200; IV Piggyback:250] Out: 100 [Blood:100]  Physical Exam:  General: Alert and oriented. Abdomen: Soft, Nondistended. Incisions: Clean and dry.  Lab Results: No results for input(s): "HGB", "HCT" in the last 72 hours.  Assessment/Plan: POD#0   1) Continue to monitor, post op labs in process   Roby Lofts, MD Resident Physician [facility]   LOS: 0 days   Zettie Pho 02/27/2024, 4:26 PM

## 2024-02-27 NOTE — Anesthesia Procedure Notes (Signed)
 Procedure Name: Intubation Date/Time: 02/27/2024 11:21 AM  Performed by: Dennison Nancy, CRNAPre-anesthesia Checklist: Patient identified, Emergency Drugs available, Suction available, Patient being monitored and Timeout performed Patient Re-evaluated:Patient Re-evaluated prior to induction Oxygen Delivery Method: Circle system utilized Preoxygenation: Pre-oxygenation with 100% oxygen Induction Type: IV induction Ventilation: Mask ventilation without difficulty Laryngoscope Size: Mac and 4 Grade View: Grade II Tube type: Oral Tube size: 7.5 mm Number of attempts: 1 Airway Equipment and Method: Stylet Placement Confirmation: ETT inserted through vocal cords under direct vision, positive ETCO2, CO2 detector and breath sounds checked- equal and bilateral Secured at: 23 cm Tube secured with: Tape (secured with pink Hy-tape) Dental Injury: Teeth and Oropharynx as per pre-operative assessment

## 2024-02-27 NOTE — Progress Notes (Signed)
 Patient ID: TAYVON CULLEY, male   DOB: [birth year], 63 y.o.   MRN: [id number]  Post-op note  Subjective: The patient is doing well.  No complaints.  Objective: Vital signs in last 24 hours: Temp:  [97.5 F (36.4 C)-97.8 F (36.6 C)] 97.5 F (36.4 C) (03/20 1519) Pulse Rate:  [57-70] 70 (03/20 1642) Resp:  [10-16] 11 (03/20 1642) BP: (138-141)/(73-88) 138/86 (03/20 1632) SpO2:  [96 %-98 %] 96 % (03/20 1642) Weight:  [95.7 kg] 95.7 kg (03/20 0855)  Intake/Output from previous day: No intake/output data recorded. Intake/Output this shift: Total I/O In: 1450 [I.V.:1200; IV Piggyback:250] Out: 100 [Blood:100]  Physical Exam:  General: Alert and oriented. Abdomen: Soft, Nondistended. Incisions: Clean and dry. GU: Urine clear.  Lab Results: Recent Labs    02/27/24 1557  HGB 13.5  HCT 39.1    Assessment/Plan: POD#0   1) Continue to monitor, ambulate, IS   Moody Bruins. MD   LOS: 0 days   Crecencio Mc 02/27/2024, 5:12 PM

## 2024-02-27 NOTE — Op Note (Addendum)
 Preoperative diagnosis: Clinically localized adenocarcinoma of the prostate (clinical stage T2b N0 M0)  Postoperative diagnosis: Clinically localized adenocarcinoma of the prostate (clinical stage T2b N0 M0)  Procedure:  Robotic assisted laparoscopic radical prostatectomy (bilateral nerve sparing) Bilateral robotic assisted laparoscopic pelvic lymphadenectomy Laparoscopic adhesiolysis  Surgeon: Moody Bruins. M.D.  Assistant: Harrie Foreman, PA-C  An assistant was required for this surgical procedure.  The duties of the assistant included but were not limited to suctioning, passing suture, camera manipulation, retraction. This procedure would not be able to be performed without an Geophysicist/field seismologist.  Resident: Dr. Michaelene Song  Anesthesia: General  Complications: None  EBL: 100 mL  IVF:  1300 mL crystalloid  Specimens: Prostate and seminal vesicles Right pelvic lymph nodes Left pelvic lymph nodes Apical margin  Disposition of specimens: Pathology  Drains: 20 Fr coude catheter # 19 Blake pelvic drain  Indication: Lee Stevens is a 63 y.o. year old patient with clinically localized prostate cancer.  After a thorough review of the management options for treatment of prostate cancer, he elected to proceed with surgical therapy and the above procedure(s).  We have discussed the potential benefits and risks of the procedure, side effects of the proposed treatment, the likelihood of the patient achieving the goals of the procedure, and any potential problems that might occur during the procedure or recuperation. Informed consent has been obtained.  Description of procedure:  The patient was taken to the operating room and a general anesthetic was administered. He was given preoperative antibiotics, placed in the dorsal lithotomy position, and prepped and draped in the usual sterile fashion. Next a preoperative timeout was performed. A urethral catheter was placed into the bladder  and a site was selected near the umbilicus for placement of the camera port. This was placed using a standard open Hassan technique which allowed entry into the peritoneal cavity under direct vision and without difficulty.  There were some omental adhesions noted on the left side of the abdomen.  An 8 mm robotic port was placed and a pneumoperitoneum established. The camera was then used to inspect the abdomen and there was no evidence of any intra-abdominal injuries or other abnormalities except the adhesions. The remaining abdominal ports were then placed. 8 mm robotic ports were placed in the right lower quadrant, left lower quadrant, and far left lateral abdominal wall. A 5 mm port was placed in the right upper quadrant and a 12 mm port was placed in the right lateral abdominal wall for laparoscopic assistance. The right sided ports were placed first and laparoscopic scissors were used to sharply take down the omental adhesions on the left side of the abdomen.  This required about 30 minutes of operative time.  All ports were placed under direct vision without difficulty. The surgical cart was then docked.   Utilizing the cautery scissors, the bladder was reflected posteriorly allowing entry into the space of Retzius and identification of the endopelvic fascia and prostate. The periprostatic fat was then removed from the prostate allowing full exposure of the endopelvic fascia. The endopelvic fascia was then incised from the apex back to the base of the prostate bilaterally and the underlying levator muscle fibers were swept laterally off the prostate thereby isolating the dorsal venous complex. The dorsal vein was then stapled and divided with a 45 mm Flex Echelon stapler. Attention then turned to the bladder neck which was divided anteriorly thereby allowing entry into the bladder and exposure of the urethral catheter. The catheter  balloon was deflated and the catheter was brought into the operative field  and used to retract the prostate anteriorly. The posterior bladder neck was then examined and was divided allowing further dissection between the bladder and prostate posteriorly until the vasa deferentia and seminal vessels were identified. The vasa deferentia were isolated, divided, and lifted anteriorly. The seminal vesicles were dissected down to their tips with care to control the seminal vascular arterial blood supply. These structures were then lifted anteriorly and the space between Denonvillier's fascia and the anterior rectum was developed with a combination of sharp and blunt dissection. This isolated the vascular pedicles of the prostate.  The lateral prostatic fascia was then sharply incised allowing release of the neurovascular bundles bilaterally. The vascular pedicles of the prostate were then ligated with Weck clips between the prostate and neurovascular bundles and divided with sharp cold scissor dissection resulting in neurovascular bundle preservation. The neurovascular bundles were then separated off the apex of the prostate and urethra bilaterally.  The urethra was then sharply transected allowing the prostate specimen to be disarticulated. The pelvis was copiously irrigated and hemostasis was ensured. There was no evidence for rectal injury.  Attention then turned to the right pelvic sidewall. The fibrofatty tissue between the external iliac vein, confluence of the iliac vessels, hypogastric artery, and Cooper's ligament was dissected free from the pelvic sidewall with care to preserve the obturator nerve. Weck clips were used for lymphostasis and hemostasis. An identical procedure was performed on the contralateral side and the lymphatic packets were removed for permanent pathologic analysis.  Attention then turned to the urethral anastomosis. A 2-0 Vicryl slip knot was placed between Denonvillier's fascia, the posterior bladder neck, and the posterior urethra to reapproximate these  structures. A double-armed 3-0 Monocryl suture was then used to perform a 360 running tension-free anastomosis between the bladder neck and urethra. A new urethral catheter was then placed into the bladder and irrigated. There were no blood clots within the bladder and the anastomosis appeared to be watertight. A #19 Blake drain was then brought through the left lateral 8 mm port site and positioned appropriately within the pelvis. It was secured to the skin with a nylon suture. The surgical cart was then undocked. The right lateral 12 mm port site was closed at the fascial level with a 0 Vicryl suture placed laparoscopically. All remaining ports were then removed under direct vision. The prostate specimen was removed intact within the Endopouch retrieval bag via the periumbilical camera port site. This fascial opening was closed with two running 0 PDS sutures. 0.25% Marcaine was then injected into all port sites and all incisions were reapproximated at the skin level with 4-0 Monocryl subcuticular sutures and Dermabond. The patient appeared to tolerate the procedure well and without complications. The patient was able to be extubated and transferred to the recovery unit in satisfactory condition.   Moody Bruins MD

## 2024-02-27 NOTE — Telephone Encounter (Signed)
 Rx refill sent to pharmacy.

## 2024-02-27 NOTE — Interval H&P Note (Signed)
 History and Physical Interval Note:  02/27/2024 10:05 AM  Lee Stevens  has presented today for surgery, with the diagnosis of PROSTATE CANCER.  The various methods of treatment have been discussed with the patient and family. After consideration of risks, benefits and other options for treatment, the patient has consented to  Procedure(s) with comments: XI ROBOTIC ASSISTED LAPAROSCOPIC RADICAL PROSTATECTOMY LEVEL 3 (N/A) - 210 MINUTES BILATERAL PELVIC LYMPH NODE DISSECTION (Bilateral) as a surgical intervention.  The patient's history has been reviewed, patient examined, no change in status, stable for surgery.  I have reviewed the patient's chart and labs.  Questions were answered to the patient's satisfaction.     [employer]

## 2024-02-27 NOTE — Transfer of Care (Signed)
 Immediate Anesthesia Transfer of Care Note  Patient: Lee Stevens  Procedure(s) Performed: XI ROBOTIC ASSISTED LAPAROSCOPIC RADICAL PROSTATECTOMY LEVEL 3, LYSIS OF ADHESIONS BILATERAL PELVIC LYMPH NODE DISSECTION (Bilateral)  Patient Location: PACU  Anesthesia Type:General  Level of Consciousness: awake, alert , oriented, and patient cooperative  Airway & Oxygen Therapy: Patient Spontanous Breathing and Patient connected to face mask oxygen  Post-op Assessment: Report given to RN and Post -op Vital signs reviewed and stable  Post vital signs: Reviewed and stable  Last Vitals:  Vitals Value Taken Time  BP 135/78 02/27/24 1519  Temp    Pulse 65 02/27/24 1525  Resp 14 02/27/24 1525  SpO2 97 % 02/27/24 1525  Vitals shown include unfiled device data.  Last Pain:  Vitals:   02/27/24 0855  TempSrc:   PainSc: 0-No pain         Complications: No notable events documented.

## 2024-02-28 ENCOUNTER — Encounter (HOSPITAL_COMMUNITY): Payer: Self-pay | Admitting: Urology

## 2024-02-28 DIAGNOSIS — I129 Hypertensive chronic kidney disease with stage 1 through stage 4 chronic kidney disease, or unspecified chronic kidney disease: Secondary | ICD-10-CM | POA: Diagnosis not present

## 2024-02-28 DIAGNOSIS — C61 Malignant neoplasm of prostate: Secondary | ICD-10-CM | POA: Diagnosis not present

## 2024-02-28 DIAGNOSIS — Z8551 Personal history of malignant neoplasm of bladder: Secondary | ICD-10-CM | POA: Diagnosis not present

## 2024-02-28 DIAGNOSIS — Z8616 Personal history of COVID-19: Secondary | ICD-10-CM | POA: Diagnosis not present

## 2024-02-28 DIAGNOSIS — E039 Hypothyroidism, unspecified: Secondary | ICD-10-CM | POA: Diagnosis not present

## 2024-02-28 DIAGNOSIS — N183 Chronic kidney disease, stage 3 unspecified: Secondary | ICD-10-CM | POA: Diagnosis not present

## 2024-02-28 DIAGNOSIS — Z7902 Long term (current) use of antithrombotics/antiplatelets: Secondary | ICD-10-CM | POA: Diagnosis not present

## 2024-02-28 DIAGNOSIS — Z79899 Other long term (current) drug therapy: Secondary | ICD-10-CM | POA: Diagnosis not present

## 2024-02-28 DIAGNOSIS — I2510 Atherosclerotic heart disease of native coronary artery without angina pectoris: Secondary | ICD-10-CM | POA: Diagnosis not present

## 2024-02-28 LAB — HEMOGLOBIN AND HEMATOCRIT, BLOOD
HCT: 37.7 % — ABNORMAL LOW (ref 39.0–52.0)
Hemoglobin: 13.2 g/dL (ref 13.0–17.0)

## 2024-02-28 MED ORDER — BISACODYL 10 MG RE SUPP
10.0000 mg | Freq: Once | RECTAL | Status: AC
  Administered 2024-02-28: 10 mg via RECTAL
  Filled 2024-02-28: qty 1

## 2024-02-28 MED ORDER — MOXIFLOXACIN HCL 0.5 % OP SOLN
1.0000 [drp] | Freq: Four times a day (QID) | OPHTHALMIC | 0 refills | Status: AC
Start: 2024-02-28 — End: 2024-03-06

## 2024-02-28 MED ORDER — ARTIFICIAL TEARS OPHTHALMIC OINT: TOPICAL_OINTMENT | OPHTHALMIC | 0 refills | Status: AC | PRN

## 2024-02-28 MED ORDER — TRAMADOL HCL 50 MG PO TABS: 50.0000 mg | ORAL_TABLET | Freq: Four times a day (QID) | ORAL | Status: DC | PRN

## 2024-02-28 NOTE — Progress Notes (Signed)
   02/28/24 0902  TOC Brief Assessment  Insurance and Status Reviewed  Patient has primary care physician Yes  Home environment has been reviewed Resides in single family home with spouse  Prior level of function: Independent with ADLs at baseline  Prior/Current Home Services No current home services  Social Drivers of Health Review SDOH reviewed no interventions necessary  Readmission risk has been reviewed Yes  Transition of care needs no transition of care needs at this time

## 2024-02-28 NOTE — Plan of Care (Signed)
  Problem: Education: Goal: Knowledge of the procedure and recovery process will improve Outcome: Adequate for Discharge   Problem: Bowel/Gastric: Goal: Gastrointestinal status for postoperative course will improve Outcome: Adequate for Discharge   Problem: Pain Management: Goal: General experience of comfort will improve Outcome: Adequate for Discharge   Problem: Skin Integrity: Goal: Demonstration of wound healing without infection will improve Outcome: Adequate for Discharge   Problem: Urinary Elimination: Goal: Ability to avoid or minimize complications of infection will improve Outcome: Adequate for Discharge Goal: Ability to achieve and maintain urine output will improve Outcome: Adequate for Discharge Goal: Home care management will improve Outcome: Adequate for Discharge   Problem: Education: Goal: Knowledge of General Education information will improve Description: Including pain rating scale, medication(s)/side effects and non-pharmacologic comfort measures Outcome: Adequate for Discharge   Problem: Health Behavior/Discharge Planning: Goal: Ability to manage health-related needs will improve Outcome: Adequate for Discharge   Problem: Clinical Measurements: Goal: Ability to maintain clinical measurements within normal limits will improve Outcome: Adequate for Discharge Goal: Will remain free from infection Outcome: Adequate for Discharge Goal: Diagnostic test results will improve Outcome: Adequate for Discharge Goal: Respiratory complications will improve Outcome: Adequate for Discharge Goal: Cardiovascular complication will be avoided Outcome: Adequate for Discharge   Problem: Activity: Goal: Risk for activity intolerance will decrease Outcome: Adequate for Discharge   Problem: Nutrition: Goal: Adequate nutrition will be maintained Outcome: Adequate for Discharge   Problem: Coping: Goal: Level of anxiety will decrease Outcome: Adequate for Discharge    Problem: Elimination: Goal: Will not experience complications related to bowel motility Outcome: Adequate for Discharge Goal: Will not experience complications related to urinary retention Outcome: Adequate for Discharge   Problem: Pain Managment: Goal: General experience of comfort will improve and/or be controlled Outcome: Adequate for Discharge   Problem: Safety: Goal: Ability to remain free from injury will improve Outcome: Adequate for Discharge   Problem: Skin Integrity: Goal: Risk for impaired skin integrity will decrease Outcome: Adequate for Discharge

## 2024-02-28 NOTE — Plan of Care (Signed)
  Problem: Pain Management: Goal: General experience of comfort will improve Outcome: Progressing   Problem: Skin Integrity: Goal: Demonstration of wound healing without infection will improve Outcome: Progressing   Problem: Urinary Elimination: Goal: Ability to achieve and maintain urine output will improve Outcome: Progressing   Problem: Clinical Measurements: Goal: Respiratory complications will improve Outcome: Progressing Goal: Cardiovascular complication will be avoided Outcome: Progressing   Problem: Activity: Goal: Risk for activity intolerance will decrease Outcome: Progressing

## 2024-02-28 NOTE — Discharge Summary (Addendum)
 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   Chest pain in adult 03/11/2016   Chronic kidney disease, stage 3 (HCC) 04/19/2017   GERD (gastroesophageal reflux disease) 06/26/2016   History of closure of ileostomy 07/03/2017   Hyperlipidemia, mixed 06/26/2016   Hypomagnesemia 08/04/2017   Hyponatremia 06/26/2017   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   Prediabetes 07/02/2016   SBO (small bowel obstruction) (HCC) 06/24/2017   Subclinical hypothyroidism 06/26/2016   Atypical angina (HCC) 09/25/2018   CAD in native artery 12/01/2018   Benign hypertension 01/19/2016   Obstructive sleep apnea 06/26/2016   ETD (Eustachian tube dysfunction), right 11/26/2018   Hypertrophy of inferior nasal turbinate 02/14/2021   Nasal congestion 02/14/2021   Nasal septal deviation 02/14/2021   Proctalgia 10/10/2020   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   Tinnitus of both ears 11/26/2018   Adjustment reaction 06/26/2016   Allergic rhinitis 06/26/2016   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/26/2016   Gross hematuria 04/24/2023   Parotid gland enlargement 03/27/2023   Prostate nodule 04/25/2023   Right lower quadrant abdominal pain 05/09/2023   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   Malignant neoplasm of prostate (HCC) 12/17/2023   COVID-19 10/28/2023   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   Patent foramen ovale 10/25/2023   Resolved Ambulatory Problems    Diagnosis Date Noted    Obstructive sleep apnea 06/26/2016   Past Medical History:  Diagnosis Date   Anticoagulant long-term use    Anxiety disorder 2018   Arthritis    Atypical chest pain 09/25/2018   Bladder tumor 05/2023   BPH associated with nocturia    Cancer (HCC)    History of anal fissures 04/10/2016   History of avascular necrosis of capital femoral epiphysis    History of kidney stones    History of small bowel obstruction    HOH (hard of hearing)    Wears glasses      History and Physical: For full details, please see admission history and physical. Briefly, Lee Stevens is a 63 y.o. year old patient who was admitted with clinically localized prostate cancer (T2b N0 M0).   Hospital Course: Pt admitted and underwent robotic prostatectomy on 02/27/2024 (bilateral nerve sparing). Their hospital course was unremarkable. By POD1, they were tolerating a regular diet, pain was controlled with oral medications, and they were deemed appropriate for discharge. They will continue their foley catheter until their scheduled follow-up appointment.   On the day of discharge, the patient was tolerating a regular diet and their pain was well controlled. They were determined to be stable for discharge home. JP drain removed prior to discharge.  Of note, pt with some blurred vision and RIGHT monocular diploplia. Discussed with Dr. Wynelle Link, ophthalmologist on call, who stated likely related to eye trama. Prescribed oxifloxacin drops 4x daily along with artificial tears. IF no resolution or improvement, pt to contact primary ophthalmologist for further evaluation.   Physical Exam:  Neuro: AAOx4 Resp: Non-labored breathing on RA Abd: Appropriately tender, soft, non-distended.  Incisions c/d/I GU: foley in place draining yellow urine  Laboratory values:  Recent Labs    02/27/24 1557 02/28/24 0507  HGB 13.5 13.2  HCT 39.1 37.7*   No results for input(s): "CREATININE" in the last 72 hours.  Disposition:  Home  Discharge medications:  Allergies as of 02/28/2024       Reactions   Keflex [cephalexin] Anaphylaxis, Rash   Hydrochlorothiazide    Joint Pain.    Crestor [rosuvastatin]    Myalgias (intolerance)        Medication List     STOP taking these medications    clopidogrel 75 MG tablet Commonly known as: PLAVIX   ibuprofen 200 MG tablet Commonly known as: ADVIL       TAKE these medications    acetaminophen 500 MG tablet Commonly known as: TYLENOL Take 1,000 mg by mouth every 6 (six) hours as needed (pain.).   ALPRAZolam 0.25 MG tablet Commonly known as: XANAX Take 0.25 mg by mouth daily as needed for anxiety.   docusate sodium 100 MG capsule Commonly known as: COLACE Take 1 capsule (100 mg total) by mouth 2 (two) times daily.   gabapentin 100 MG capsule Commonly known as: NEURONTIN Take 100 mg by mouth 2 (two) times daily.   lansoprazole 15 MG capsule Commonly known as: PREVACID Take 15 mg by mouth at bedtime.   levothyroxine 75 MCG tablet Commonly known as: SYNTHROID Take 75 mcg by mouth daily before breakfast.   metoprolol succinate 25 MG 24 hr tablet Commonly known as: TOPROL-XL Take 1 tablet (25 mg total) by mouth daily. TAKE 1 TABLET BY MOUTH ONCE DAILY   nitroGLYCERIN 0.4 MG SL tablet Commonly known as: NITROSTAT Place 0.4 mg under the tongue every 5 (five) minutes x 3 doses as needed for chest pain.   olmesartan 20 MG tablet Commonly known as: Benicar Take 1 tablet (20 mg total) by mouth 2 (two) times daily. What changed: when to take this   Repatha SureClick 140 MG/ML Soaj Generic drug: Evolocumab Inject 140 mg into the skin every 14 (fourteen) days.   sulfamethoxazole-trimethoprim 800-160 MG tablet Commonly known as: BACTRIM DS Take 1 tablet by mouth 2 (two) times daily. Start the day prior to foley removal appointment   traMADol 50 MG tablet Commonly known as: ULTRAM Take 1-2 tablets (50-100 mg total) by mouth every 6 (six) hours  as needed for moderate pain (pain score 4-6). What changed: how much to take        Followup:   Follow-up Information     Heloise Purpura, MD Follow up on 03/06/2024.   Specialty: Urology Why: at 8:45 Contact information: [address] [place] [address] [phone]

## 2024-02-28 NOTE — Progress Notes (Signed)
 1 Day Post-Op Subjective: The patient is doing well.  No nausea or vomiting. Pain is adequately controlled. Pt with some right hand numbness in radial distribution, improving this AM.  Objective: Vital signs in last 24 hours: Temp:  [97.5 F (36.4 C)-98.9 F (37.2 C)] 98.9 F (37.2 C) (03/21 0624) Pulse Rate:  [57-75] 68 (03/21 0624) Resp:  [10-20] 17 (03/21 0624) BP: (127-168)/(69-90) 134/69 (03/21 0624) SpO2:  [94 %-99 %] 96 % (03/21 0624) Weight:  [95.7 kg] 95.7 kg (03/20 0855)  Intake/Output from previous day: 03/20 0701 - 03/21 0700 In: 3990.9 [P.O.:510; I.V.:3230.9; IV Piggyback:250] Out: 1760 [Urine:1500; Drains:160; Blood:100] Intake/Output this shift: No intake/output data recorded.  Physical Exam:  General: Alert and oriented. CV: RRR Lungs: Clear bilaterally. GI: Soft, Nondistended. Incisions: Clean, dry, and intact Urine: Clear Extremities: Nontender, no erythema, no edema. Mild decreased sensation on palmar first 3 digits.   Lab Results: Recent Labs    02/27/24 1557 02/28/24 0507  HGB 13.5 13.2  HCT 39.1 37.7*      Assessment/Plan: POD# 1 s/p robotic prostatectomy.  1) SL IVF 2) Ambulate, Incentive spirometry 3) Transition to oral pain medication 4) Dulcolax suppository 5) D/C pelvic drain 6) Plan for likely discharge later today   Lee Lofts, MD Resident Physician [facility]    LOS: 0 days   Lee Stevens 02/28/2024, 7:19 AM

## 2024-02-28 NOTE — Progress Notes (Signed)
 Pt c/o fingers being numb (thumb,pointer and middle fingers). States they have been like that since he woke up from surgery. Dr Cardell Peach notified. No new orders at this time.

## 2024-03-02 LAB — SURGICAL PATHOLOGY

## 2024-03-03 NOTE — Anesthesia Postprocedure Evaluation (Signed)
 Anesthesia Post Note  Patient: ERMON SAGAN  Procedure(s) Performed: XI ROBOTIC ASSISTED LAPAROSCOPIC RADICAL PROSTATECTOMY LEVEL 3, LYSIS OF ADHESIONS BILATERAL PELVIC LYMPH NODE DISSECTION (Bilateral)     Patient location during evaluation: PACU Anesthesia Type: General Level of consciousness: awake and alert Pain management: pain level controlled Vital Signs Assessment: post-procedure vital signs reviewed and stable Respiratory status: spontaneous breathing, nonlabored ventilation, respiratory function stable and patient connected to nasal cannula oxygen Cardiovascular status: blood pressure returned to baseline and stable Postop Assessment: no apparent nausea or vomiting Anesthetic complications: no   No notable events documented.  Last Vitals:  Vitals:   02/28/24 0624 02/28/24 1200  BP: 134/69 117/65  Pulse: 68 60  Resp: 17 18  Temp: 37.2 C 36.7 C  SpO2: 96% 98%    Last Pain:  Vitals:   02/28/24 1200  TempSrc: Oral  PainSc:                  Nelle Don Shaunae Sieloff

## 2024-04-01 DIAGNOSIS — M47812 Spondylosis without myelopathy or radiculopathy, cervical region: Secondary | ICD-10-CM | POA: Diagnosis not present

## 2024-04-02 DIAGNOSIS — M62838 Other muscle spasm: Secondary | ICD-10-CM | POA: Diagnosis not present

## 2024-04-02 DIAGNOSIS — N393 Stress incontinence (female) (male): Secondary | ICD-10-CM | POA: Diagnosis not present

## 2024-04-02 DIAGNOSIS — M6281 Muscle weakness (generalized): Secondary | ICD-10-CM | POA: Diagnosis not present

## 2024-04-13 DIAGNOSIS — M6281 Muscle weakness (generalized): Secondary | ICD-10-CM | POA: Diagnosis not present

## 2024-04-13 DIAGNOSIS — N393 Stress incontinence (female) (male): Secondary | ICD-10-CM | POA: Diagnosis not present

## 2024-04-13 DIAGNOSIS — M62838 Other muscle spasm: Secondary | ICD-10-CM | POA: Diagnosis not present

## 2024-05-20 DIAGNOSIS — C61 Malignant neoplasm of prostate: Secondary | ICD-10-CM | POA: Diagnosis not present

## 2024-06-03 ENCOUNTER — Other Ambulatory Visit: Payer: Self-pay

## 2024-06-03 DIAGNOSIS — E782 Mixed hyperlipidemia: Secondary | ICD-10-CM

## 2024-06-03 DIAGNOSIS — I129 Hypertensive chronic kidney disease with stage 1 through stage 4 chronic kidney disease, or unspecified chronic kidney disease: Secondary | ICD-10-CM

## 2024-06-03 DIAGNOSIS — I2510 Atherosclerotic heart disease of native coronary artery without angina pectoris: Secondary | ICD-10-CM

## 2024-06-03 DIAGNOSIS — Q2112 Patent foramen ovale: Secondary | ICD-10-CM

## 2024-06-03 MED ORDER — METOPROLOL SUCCINATE ER 25 MG PO TB24: 25.0000 mg | ORAL_TABLET | Freq: Every day | ORAL | 0 refills | Status: DC

## 2024-06-05 DIAGNOSIS — N393 Stress incontinence (female) (male): Secondary | ICD-10-CM | POA: Diagnosis not present

## 2024-06-05 DIAGNOSIS — N5201 Erectile dysfunction due to arterial insufficiency: Secondary | ICD-10-CM | POA: Diagnosis not present

## 2024-06-05 DIAGNOSIS — H60332 Swimmer's ear, left ear: Secondary | ICD-10-CM

## 2024-06-05 DIAGNOSIS — C61 Malignant neoplasm of prostate: Secondary | ICD-10-CM | POA: Diagnosis not present

## 2024-06-05 HISTORY — DX: Swimmer's ear, left ear: H60.332

## 2024-06-18 DIAGNOSIS — Z1330 Encounter for screening examination for mental health and behavioral disorders, unspecified: Secondary | ICD-10-CM | POA: Diagnosis not present

## 2024-06-18 DIAGNOSIS — M4722 Other spondylosis with radiculopathy, cervical region: Secondary | ICD-10-CM | POA: Diagnosis not present

## 2024-06-18 DIAGNOSIS — M50123 Cervical disc disorder at C6-C7 level with radiculopathy: Secondary | ICD-10-CM | POA: Diagnosis not present

## 2024-06-25 ENCOUNTER — Other Ambulatory Visit: Payer: Self-pay

## 2024-06-25 DIAGNOSIS — Q2112 Patent foramen ovale: Secondary | ICD-10-CM

## 2024-06-25 DIAGNOSIS — I2510 Atherosclerotic heart disease of native coronary artery without angina pectoris: Secondary | ICD-10-CM

## 2024-06-25 DIAGNOSIS — E782 Mixed hyperlipidemia: Secondary | ICD-10-CM

## 2024-06-25 DIAGNOSIS — I129 Hypertensive chronic kidney disease with stage 1 through stage 4 chronic kidney disease, or unspecified chronic kidney disease: Secondary | ICD-10-CM

## 2024-06-25 MED ORDER — METOPROLOL SUCCINATE ER 25 MG PO TB24: 25.0000 mg | ORAL_TABLET | Freq: Every day | ORAL | 0 refills | Status: DC

## 2024-07-06 DIAGNOSIS — C61 Malignant neoplasm of prostate: Secondary | ICD-10-CM | POA: Diagnosis not present

## 2024-07-06 DIAGNOSIS — N5201 Erectile dysfunction due to arterial insufficiency: Secondary | ICD-10-CM | POA: Diagnosis not present

## 2024-07-06 DIAGNOSIS — M50122 Cervical disc disorder at C5-C6 level with radiculopathy: Secondary | ICD-10-CM | POA: Diagnosis not present

## 2024-07-06 DIAGNOSIS — Z8551 Personal history of malignant neoplasm of bladder: Secondary | ICD-10-CM | POA: Diagnosis not present

## 2024-07-06 DIAGNOSIS — M50121 Cervical disc disorder at C4-C5 level with radiculopathy: Secondary | ICD-10-CM | POA: Diagnosis not present

## 2024-07-06 DIAGNOSIS — M50123 Cervical disc disorder at C6-C7 level with radiculopathy: Secondary | ICD-10-CM | POA: Diagnosis not present

## 2024-07-06 DIAGNOSIS — M2578 Osteophyte, vertebrae: Secondary | ICD-10-CM | POA: Diagnosis not present

## 2024-07-07 ENCOUNTER — Encounter: Payer: Self-pay | Admitting: Cardiology

## 2024-07-07 DIAGNOSIS — I2510 Atherosclerotic heart disease of native coronary artery without angina pectoris: Secondary | ICD-10-CM

## 2024-07-07 DIAGNOSIS — I129 Hypertensive chronic kidney disease with stage 1 through stage 4 chronic kidney disease, or unspecified chronic kidney disease: Secondary | ICD-10-CM

## 2024-07-07 DIAGNOSIS — E782 Mixed hyperlipidemia: Secondary | ICD-10-CM

## 2024-07-07 DIAGNOSIS — Q2112 Patent foramen ovale: Secondary | ICD-10-CM

## 2024-07-07 MED ORDER — METOPROLOL SUCCINATE ER 25 MG PO TB24: 25.0000 mg | ORAL_TABLET | Freq: Every day | ORAL | 0 refills | Status: DC

## 2024-07-16 DIAGNOSIS — M4802 Spinal stenosis, cervical region: Secondary | ICD-10-CM | POA: Diagnosis not present

## 2024-07-16 DIAGNOSIS — M50123 Cervical disc disorder at C6-C7 level with radiculopathy: Secondary | ICD-10-CM | POA: Diagnosis not present

## 2024-07-16 DIAGNOSIS — M5010 Cervical disc disorder with radiculopathy, unspecified cervical region: Secondary | ICD-10-CM

## 2024-07-16 DIAGNOSIS — M50122 Cervical disc disorder at C5-C6 level with radiculopathy: Secondary | ICD-10-CM | POA: Diagnosis not present

## 2024-07-16 DIAGNOSIS — M4722 Other spondylosis with radiculopathy, cervical region: Secondary | ICD-10-CM

## 2024-07-16 HISTORY — DX: Other spondylosis with radiculopathy, cervical region: M47.22

## 2024-07-16 HISTORY — DX: Cervical disc disorder with radiculopathy, unspecified cervical region: M50.10

## 2024-07-17 ENCOUNTER — Telehealth: Payer: Self-pay | Admitting: *Deleted

## 2024-07-17 ENCOUNTER — Encounter: Payer: Self-pay | Admitting: *Deleted

## 2024-07-17 MED ORDER — CLOPIDOGREL BISULFATE 75 MG PO TABS: 75.0000 mg | ORAL_TABLET | Freq: Every day | ORAL | 0 refills | Status: DC

## 2024-07-17 NOTE — Telephone Encounter (Signed)
 Plavix sent to pharmacy

## 2024-07-23 DIAGNOSIS — M50122 Cervical disc disorder at C5-C6 level with radiculopathy: Secondary | ICD-10-CM | POA: Diagnosis not present

## 2024-07-23 DIAGNOSIS — M4722 Other spondylosis with radiculopathy, cervical region: Secondary | ICD-10-CM | POA: Diagnosis not present

## 2024-07-23 DIAGNOSIS — M4802 Spinal stenosis, cervical region: Secondary | ICD-10-CM | POA: Diagnosis not present

## 2024-07-23 DIAGNOSIS — M50123 Cervical disc disorder at C6-C7 level with radiculopathy: Secondary | ICD-10-CM | POA: Diagnosis not present

## 2024-07-27 DIAGNOSIS — M50123 Cervical disc disorder at C6-C7 level with radiculopathy: Secondary | ICD-10-CM | POA: Diagnosis not present

## 2024-07-27 DIAGNOSIS — Z01818 Encounter for other preprocedural examination: Secondary | ICD-10-CM | POA: Diagnosis not present

## 2024-07-27 DIAGNOSIS — I10 Essential (primary) hypertension: Secondary | ICD-10-CM | POA: Diagnosis not present

## 2024-07-27 DIAGNOSIS — Z01810 Encounter for preprocedural cardiovascular examination: Secondary | ICD-10-CM | POA: Diagnosis not present

## 2024-07-27 DIAGNOSIS — M4722 Other spondylosis with radiculopathy, cervical region: Secondary | ICD-10-CM | POA: Diagnosis not present

## 2024-07-27 DIAGNOSIS — R001 Bradycardia, unspecified: Secondary | ICD-10-CM | POA: Diagnosis not present

## 2024-07-27 DIAGNOSIS — I444 Left anterior fascicular block: Secondary | ICD-10-CM | POA: Diagnosis not present

## 2024-07-27 DIAGNOSIS — M4802 Spinal stenosis, cervical region: Secondary | ICD-10-CM | POA: Diagnosis not present

## 2024-07-27 DIAGNOSIS — M50122 Cervical disc disorder at C5-C6 level with radiculopathy: Secondary | ICD-10-CM | POA: Diagnosis not present

## 2024-07-27 DIAGNOSIS — I2510 Atherosclerotic heart disease of native coronary artery without angina pectoris: Secondary | ICD-10-CM | POA: Diagnosis not present

## 2024-07-27 DIAGNOSIS — E782 Mixed hyperlipidemia: Secondary | ICD-10-CM | POA: Diagnosis not present

## 2024-07-27 DIAGNOSIS — Z01812 Encounter for preprocedural laboratory examination: Secondary | ICD-10-CM | POA: Diagnosis not present

## 2024-07-30 DIAGNOSIS — M4802 Spinal stenosis, cervical region: Secondary | ICD-10-CM | POA: Diagnosis not present

## 2024-07-30 DIAGNOSIS — E039 Hypothyroidism, unspecified: Secondary | ICD-10-CM | POA: Diagnosis not present

## 2024-07-30 DIAGNOSIS — Z87442 Personal history of urinary calculi: Secondary | ICD-10-CM | POA: Diagnosis not present

## 2024-07-30 DIAGNOSIS — I129 Hypertensive chronic kidney disease with stage 1 through stage 4 chronic kidney disease, or unspecified chronic kidney disease: Secondary | ICD-10-CM | POA: Diagnosis not present

## 2024-07-30 DIAGNOSIS — M50123 Cervical disc disorder at C6-C7 level with radiculopathy: Secondary | ICD-10-CM | POA: Diagnosis not present

## 2024-07-30 DIAGNOSIS — G709 Myoneural disorder, unspecified: Secondary | ICD-10-CM | POA: Diagnosis not present

## 2024-07-30 DIAGNOSIS — M50122 Cervical disc disorder at C5-C6 level with radiculopathy: Secondary | ICD-10-CM | POA: Diagnosis not present

## 2024-07-30 DIAGNOSIS — G4733 Obstructive sleep apnea (adult) (pediatric): Secondary | ICD-10-CM | POA: Diagnosis not present

## 2024-07-30 DIAGNOSIS — I2510 Atherosclerotic heart disease of native coronary artery without angina pectoris: Secondary | ICD-10-CM | POA: Diagnosis not present

## 2024-07-30 DIAGNOSIS — N189 Chronic kidney disease, unspecified: Secondary | ICD-10-CM | POA: Diagnosis not present

## 2024-07-30 DIAGNOSIS — Z981 Arthrodesis status: Secondary | ICD-10-CM | POA: Diagnosis not present

## 2024-07-30 DIAGNOSIS — M4722 Other spondylosis with radiculopathy, cervical region: Secondary | ICD-10-CM | POA: Diagnosis not present

## 2024-07-30 DIAGNOSIS — F419 Anxiety disorder, unspecified: Secondary | ICD-10-CM | POA: Diagnosis not present

## 2024-08-10 NOTE — Progress Notes (Signed)
 Cardiology Office Note:    Date:  08/12/2024   ID:  Lee Stevens, DOB 1961/03/28, MRN [id number]  PCP:  Lee Lamar CROME, MD   [facility] Providers Cardiologist:  Lee Leiter, MD     Referring MD: Lee Lamar CROME, MD     History of Present Illness:    Lee Stevens is a 63 y.o. male with a hx of nonobstructive CAD, hypertension, OSA, GERD, hypothyroidism, CKD stage III, anxiety, hyperlipidemia intolerant of statins, PFO, LAFB.  08/09/2022 left heart cath mid LAD 40% stenosed, recommendations for medical therapy 11/14/2018 coronary CTA calcium  score 514, 96 percentile, PFO, FFR without any hemodynamic significance  He established care with Lee. Leiter in 2017 for evaluation of chest pain that had been recurrent for at least a decade prior.  In 2019 he underwent a coronary CTA revealing a calcium  score of 514, 96 percentile but FFR was negative for hemodynamic significance. He was evaluated 08/07/2022 with concerns of sharp chest pain located in the center of his chest, pain could last for seconds to an hour, causing him to feel sick all over his body.  The decision was made to proceed with a left heart cath.  LHC on 08/09/2022 revealed mild nonobstructive coronary artery disease. Evaluated by Lee. Leiter on 02/19/2023 for recent syncopal episode in the context of pneumonia and hypotension.  He was to trend his blood pressure over the next few weeks and if his systolic remains less than 105, could discontinue his ARB.  Due to his known CAD he was started on Plavix .  Most recently evaluated by myself on 03/28/2023, he had been having episodes of atypical chest pain, also dealing with anxiety, no changes were made to medications or plan of care, advised to keep his follow-up appointment with Lee. Leiter.  He presents today for follow-up of his nonobstructive CAD.  Since he was last evaluated in our office he has been dealing with prostate cancer, bladder cancer--he states both were called very  early and he is not undergoing any treatment for this, he has also recently undergone ACDF, and is healing from this procedure.  He has lost approximately 20 pounds over the last year, his blood pressure is on the low normal side today, he has been bothered by dizziness at times at home as well.  He does continue to have episodes of chest pain very infrequently, felt to be consistent with anxiety/stress and he was prescribed Xanax  as needed by his PCP which he takes very infrequently for this, this is not consistent with angina.  He denies palpitations, dyspnea, pnd, orthopnea, n, v, dizziness, syncope, edema, weight gain, or early satiety.    Past Medical History:  Diagnosis Date   Allergic rhinitis    Anticoagulant long-term use    plavix ---   managed by cardiology   Anxiety disorder 2018   Arthritis    neck   Atypical chest pain 09/25/2018   followed by cardiology-- Lee Stevens;  continue's to monitor for long history per Lee Stevens note pt had cardiac cath in 2008 w/ luminal irregularity;  normal stress echo 04/ 2017;   Benign hypertension 01/19/2016   Bladder tumor 05/2023   BPH associated with nocturia    CAD in native artery 12/01/2018   cardiologist--- Lee Stevens;   coronary CT FFR/ morph 11-14-2018  calcium  score=514 involving RCA/ LAD/ PDA/ PL;   cardiac cath 08-09-2022  nonob CAD mild LAD 40%   Cancer (HCC)    bladder  Chronic kidney disease, stage 3 (HCC) 04/19/2017   folllowed by pcp   ETD (Eustachian tube dysfunction), right    GERD (gastroesophageal reflux disease)    History of anal fissures 04/10/2016   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 and reversal   History of avascular necrosis of capital femoral epiphysis    s/p THA bilateral   History of kidney stones    History of small bowel obstruction    06-28-2017  s/p small bowel resection w/ ileostomy revision   HOH (hard of hearing)    left ear   Hyperlipidemia, mixed     Hypertensive chronic kidney disease    Parotid gland enlargement    ENT -- Lee Lee Stevens (note in care everywhere )---  06/ 2024 parotid right greater than left ,  resolved without intervention   Prostate nodule 04/25/2023   Right lower quadrant abdominal pain 05/09/2023   Subclinical hypothyroidism    Tinnitus of both ears 11/26/2018   Wears glasses     Past Surgical History:  Procedure Laterality Date   ANAL FISSURE REPAIR  07/06/2016   @[facility]  BY Lee Lee Stevens;   ENDORECTAL ADVANCEMENT FLAP 07-06-2016  AND RE-DO 10-09-2016   CARDIAC CATHETERIZATION  2008   per pt's cardiologist-- Lee monetta-- note , luminal irregurilities   CHOLECYSTECTOMY, LAPAROSCOPIC  2008   COLONOSCOPY  2016   COLOSTOMY TAKEDOWN  06/02/2018   @[facility] ;   ILEOSTOMY TAKEDOWN W/ ANORECTAL EUA   EXPLORATORY LAPAROTOMY W/ BOWEL RESECTION  06/28/2017   @[facility] ;   SMALL BOWEL RESECTION W/ ILEOSTOMY REVISION   EXTRACORPOREAL SHOCK WAVE LITHOTRIPSY  2006   LAPAROSCOPIC APPENDECTOMY  2000   LAPAROSCOPIC LOOP COLOSTOMY  06/07/2017   @[facility]  BY Lee Stevens;   LOOP ILEOSTOMY CREATION   LEFT HEART CATH AND CORONARY ANGIOGRAPHY N/A 08/09/2022   Procedure: LEFT HEART CATH AND CORONARY ANGIOGRAPHY;  Surgeon: Lee Candyce RAMAN, MD;  Location: [facility];  Service: Cardiovascular;  Laterality: N/A;   PELVIC LYMPH NODE DISSECTION Bilateral 02/27/2024   Procedure: BILATERAL PELVIC LYMPH NODE DISSECTION;  Surgeon: Lee Glance, MD;  Location: [facility];  Service: Urology;  Laterality: Bilateral;   PLACEMENT OF SETON     @[facility] ;   08-29-2016  AND 11-05-2016   PROSTATE BIOPSY     RECTAL EXAM UNDER ANESTHESIA  03/07/2018   @[facility]    ROBOT ASSISTED LAPAROSCOPIC RADICAL PROSTATECTOMY N/A 02/27/2024   Procedure: XI ROBOTIC ASSISTED LAPAROSCOPIC RADICAL PROSTATECTOMY LEVEL 3, LYSIS OF ADHESIONS;  Surgeon: Lee Glance, MD;  Location: [facility];  Service: Urology;  Laterality: N/A;  210 MINUTES   TONSILLECTOMY AND ADENOIDECTOMY      child   TOTAL HIP ARTHROPLASTY Bilateral 2011   6 months apart same year   TRANSRECTAL ULTRASOUND N/A 11/19/2023   Procedure: TRANSRECTAL ULTRASOUND WITH PROSTATE BIOPSY;  Surgeon: Lee Arlyss CROME, MD;  Location[facility];  Service: Urology;  Laterality: N/A;   TRANSURETHRAL RESECTION OF BLADDER TUMOR WITH MITOMYCIN -C N/A 06/26/2023   Procedure: TRANSURETHRAL RESECTION OF BLADDER TUMOR WITH INTRAOPERATIVE INSTILLATION OF GEMCITABINE ;  Surgeon: Lee Arlyss CROME, MD;  Location: [facility];  Service: Urology;  Laterality: N/A;  75 MINUTES NEEDED FOR CASE   TURBINATE REDUCTION  05/03/2023   @[facility]  by Lee Lee Stevens;   BILATERAL INFERIOR    Current Medications: Current Meds  Medication Sig   acetaminophen  (TYLENOL ) 500 MG tablet Take 1,000 mg by mouth every 6 (six) hours as needed (pain.).  ALPRAZolam  (XANAX ) 0.25 MG tablet Take 0.25 mg by mouth daily as needed for anxiety.   clopidogrel  (PLAVIX ) 75 MG tablet Take 1 tablet (75 mg total) by mouth daily.   Evolocumab  (REPATHA  SURECLICK) 140 MG/ML SOAJ Inject 140 mg into the skin every 14 (fourteen) days.   HYDROcodone-acetaminophen  (NORCO) 10-325 MG tablet Take 1 tablet by mouth every 4 (four) hours as needed.   lansoprazole (PREVACID) 15 MG capsule Take 15 mg by mouth at bedtime.   levothyroxine  (SYNTHROID ) 75 MCG tablet Take 75 mcg by mouth daily before breakfast.   methocarbamol (ROBAXIN) 500 MG tablet Take one tablet (500 mg dose) by mouth 3 (three) times a day as needed (muscle spasms).   metoprolol  succinate (TOPROL -XL) 25 MG 24 hr tablet Take 1 tablet (25 mg total) by mouth daily. Patient needs an appointment for further refills. 2 nd attempt   nitroGLYCERIN  (NITROSTAT ) 0.4 MG SL tablet Place 0.4 mg under the tongue every 5 (five) minutes x 3 doses as needed for chest pain.   olmesartan  (BENICAR ) 20 MG tablet Take 1 tablet (20 mg total) by mouth 2 (two) times daily. (Patient taking differently: Take 20 mg by  mouth 2 (two) times daily. Patient takes once a day)   pregabalin (LYRICA) 50 MG capsule Take 50 mg by mouth.   tadalafil (CIALIS) 20 MG tablet Take 20 mg by mouth daily as needed.   tadalafil (CIALIS) 5 MG tablet Take 5 mg by mouth daily.     Allergies:   Keflex [cephalexin], Hydrochlorothiazide, and Crestor [rosuvastatin]   Social History   Socioeconomic History   Marital status: Married    Spouse name: Not on file   Number of children: Not on file   Years of education: Not on file   Highest education level: Not on file  Occupational History   Not on file  Tobacco Use   Smoking status: Never   Smokeless tobacco: Former    Types: Snuff    Quit date: 2004  Vaping Use   Vaping status: Never Used  Substance and Sexual Activity   Alcohol use: Not Currently   Drug use: Never   Sexual activity: Not on file  Other Topics Concern   Not on file  Social History Narrative   Not on file   Social Drivers of Health   Financial Resource Strain: Low Risk  (06/18/2024)   Received from [facility]   Overall Financial Resource Strain (CARDIA)    Difficulty of Paying Living Expenses: Not hard at all  Food Insecurity: No Food Insecurity (06/18/2024)   [facility]   Hunger Vital Sign    Within the past 12 months, you worried that your food would run out before you got the money to buy more.: Never true    Within the past 12 months, the food you bought just didn't last and you didn't have money to get more.: Never true  Transportation Needs: No Transportation Needs (06/18/2024)   Received from [facility] - Transportation    Lack of Transportation (Medical): No    Lack of Transportation (Non-Medical): No  Physical Activity: Insufficiently Active (12/05/2021)   Received from [facility]   Exercise Vital Sign    On average, how many days per week do you engage in moderate to strenuous exercise (like a brisk walk)?: 2 days    On average, how many minutes do  you engage in exercise at this level?: 20 min  Stress: No Stress Concern  Present (07/30/2024)   Received from [facility] - Occupational Stress Questionnaire    Do you feel stress - tense, restless, nervous, or anxious, or unable to sleep at night because your mind is troubled all the time - these days?: Not at all  Social Connections: Unknown (04/23/2022)   Received from [facility]   Social Network    Social Network: Not on file     Family History: The patient's family history includes Heart attack in his father; Hyperlipidemia in his father; Hypertension in his mother.  ROS:   Please see the history of present illness.     All other systems reviewed and are negative.  EKGs/Labs/Other Studies Reviewed:    The following studies were reviewed today:  Cardiac Studies & Procedures   ______________________________________________________________________________________________ CARDIAC CATHETERIZATION  CARDIAC CATHETERIZATION 08/09/2022  Conclusion   Mid LAD lesion is 40% stenosed.   LV end diastolic pressure is mildly elevated.  LVEDP 20 mm Hg.   There is no aortic valve stenosis.  Mild, nonobstructive coronary artery disease.  Very large, dominant right coronary artery which feeds the apex.  LAD and its branches are generally small and diffusely diseased.  Continue medical therapy.  We spoke about the importance of healthy diet, regular exercise.  Findings Coronary Findings Diagnostic  Dominance: Right  Left Anterior Descending There is mild diffuse disease throughout the vessel. Mid LAD lesion is 40% stenosed. The lesion is eccentric.  Left Circumflex There is mild diffuse disease throughout the vessel.  Right Coronary Artery Vessel is large. The vessel exhibits minimal luminal irregularities.  Intervention  No interventions have been documented.          CT SCANS  CT CORONARY MORPH W/CTA COR W/SCORE  11/14/2018  Addendum 11/16/2018  2:01 PM ADDENDUM REPORT: 11/16/2018 13:59  HISTORY: atypical angina  EXAM: Cardiac/Coronary  CT  TECHNIQUE: The patient was scanned on a [employer].  PROTOCOL: A 120 kV prospective scan was triggered in the descending thoracic aorta at 111 HU's. Axial non-contrast 3 mm slices were carried out through the heart. The data set was analyzed on a dedicated work station and scored using the Agatson method. Gantry rotation speed was 250 msecs and collimation was .6 mm. No beta blockade and 0.8 mg of sl NTG was given. The 3D data set was reconstructed in 5% intervals of the 67-82 % of the R-R cycle. Diastolic phases were analyzed on a dedicated work station using MPR, MIP and VRT modes. The patient received 80 cc of contrast.  FINDINGS: Coronary calcium  score: The patient's coronary artery calcium  score is 514, which places the patient in the 96th percentile.  Coronary arteries: Normal coronary origins.  Right dominance.  Right Coronary Artery: Mild ostial mixed atherosclerotic plaque, 25-49% stenosis. Moderate mixed atherosclerotic plaque in the mid RCA, 50-69% stenosis. Moderate primarly atherosclerotic plaque, with 50-69% stenosis in the proximal PDA branch. Moderate mixed atherosclerotic plaque in the proximal posterolateral branch, 50-69% stenosis. Mild diffused mixed plaque throughout the RCA.  Left Main Coronary Artery: Mild mixed atherosclerotic plaque in the ostial left main, 25-49% stenosis. Short left main.  Left Anterior Descending Coronary Artery: Moderate diffuse irregular mixed atherosclerotic disease in the proximal and mid LAD, 50-69% stenosis. In the mid to distal LAD, disease may be severe, image quality limits exact luminal assessment. Main body of the LAD is small caliber. There is a large diagonal branch that creates a dual LAD system, with moderate ostial mixed  atherosclerotic plaque, 50-69% stenosis.  Ramus  intermedius: Small caliber ramus intermedius with moderate ostial and proximal tubular atherosclerotic plaque, 50-69% stenosis.  Left Circumflex Artery: Mild mixed atherosclerotic plaque at the ostial circumflex artery. Minimal scattered disease throughout the vessel. Motion vs breath hold artifact limits the assessment middle portion of the proximal circumflex, cannot evaluate this segment accurately. Patent OM1 with mild diffuse disease.  Aorta:  Normal size.  No calcifications.  No dissection.  Aortic Valve: No calcifications.  Other findings:  Normal pulmonary vein drainage into the left atrium.  Normal left atrial appendage without a thrombus.  Normal size of the pulmonary artery.  Patent foramen ovale with left to right shunt.  IMPRESSION: 1. Coronary calcium  score is 514, which places the patient in the 96th percentile for age and sex matched control.  2. Normal coronary origin with right dominance.  3. Moderate CAD, CADRADS = 3. Moderate stenosis in the mid RCA, proximal PDA, proximal PL, proximal and mid LAD, ostial small caliber ramus intermedius. CT FFR analysis will be performed and reported separately.  4. Patent foramen ovale with left to right shunt demonstrated on cine images.   Electronically Signed By: Soyla Merck On: 11/16/2018 13:59  Narrative EXAM: OVER-READ INTERPRETATION  CT CHEST  The following report is an over-read performed by radiologist Lee. Juliene Balder of [facility] on 11/14/2018. This over-read does not include interpretation of cardiac or coronary anatomy or pathology. The coronary calcium  score/coronary CTA interpretation by the cardiologist is attached.  COMPARISON:  Chest radiograph 07/09/2017  FINDINGS: Vascular: Normal caliber of the visualized thoracic aorta without atherosclerotic disease. Heart size is normal. Normal caliber of the pulmonary arteries. Main pulmonary arteries are  patent.  Mediastinum/Nodes: Small mediastinal lymph nodes. No significant hilar lymphadenopathy. Esophagus is unremarkable.  Lungs/Pleura: Few hazy densities in the right lower lobe are nonspecific but likely represent atelectasis. Question a 6 x 6 mm nodular density in the right lower lobe on sequence 12, image 13. No significant airspace disease or consolidation in the lungs. No large pleural effusions. Focal volume loss or scarring in the left lower lobe.  Upper Abdomen: There appears to be a calcification near the gallbladder wall and not clear if this is related to a gallstone.  Musculoskeletal: No acute abnormality.  IMPRESSION: 1. Question a 6 mm nodule in the right lower lobe. There may be artifact in this area and this nodular density is indeterminate. Non-contrast chest CT at 6-12 months is recommended. If the nodule is stable at time of repeat CT, then future CT at 18-24 months (from today's scan) is considered optional for low-risk patients, but is recommended for high-risk patients. This recommendation follows the consensus statement: Guidelines for Management of Incidental Pulmonary Nodules Detected on CT Images: From the Fleischner Society 2017; Radiology 2017; 284:228-243. 2. Calcification near the periphery or wall of the gallbladder. This is indeterminate and incompletely evaluated. Not clear if this is related to a gallstone. This could be further characterized with a right upper quadrant ultrasound if this is an area of concern.  Electronically Signed: By: Juliene Balder M.D. On: 11/14/2018 17:12   CT CORONARY FRACTIONAL FLOW RESERVE DATA PREP 11/14/2018  Narrative EXAM: CT FFR ANALYSIS  CLINICAL DATA:  Atypical angina  FINDINGS: FFRct analysis was performed on the original cardiac CT angiogram dataset. Diagrammatic representation of the FFRct analysis is provided in a separate PDF document in PACS. This dictation was created using the PDF document and  an interactive 3D model  of the results. 3D model is not available in the EMR/PACS. Normal FFR range is >0.80. 0.76-0.8 is a gray zone (indeterminate).  1. Left Main:  No significant stenosis. FFR =0.92  2. LAD: No significant stenosis. Proximal FFR = 0.88, Mid FFR = 0.80, Distal FFR = not analyzed. Dual LAD/Diagonal 1 FFR = 0.88. Diffuse distal disease with nonsignificant FFR 0.81. 3. Ramus intermedius: No significant stenosis, FFr = 0.94 4. LCX: No significant stenosis. Proximal FFR = 0.86, Distal FFR = 0.81, distal OM1 FFR =0.82 5. RCA: No significant stenosis. Proximal FFR = 0.98, Mid FFR = 0.94, Distal FFR = 0.93, proximal PDA FFR = 0.91, proximal PL FFR = 0.91  IMPRESSION: 1. CT FFR analysis didn't show any hemodynamically significant stenosis.   Electronically Signed By: Soyla Merck On: 11/16/2018 14:07     ______________________________________________________________________________________________      EKG:  EKG is  ordered today.  The ekg ordered today demonstrates sinus rhythm, LAFB, heart rate 66 bpm, consistent with prior EKG tracings.  Recent Labs: 02/21/2024: BUN 23; Creatinine, Ser 1.29; Platelets 270; Potassium 4.7; Sodium 139 02/28/2024: Hemoglobin 13.2  Recent Lipid Panel No results found for: CHOL, TRIG, HDL, CHOLHDL, VLDL, LDLCALC, LDLDIRECT   Risk Assessment/Calculations:                Physical Exam:    VS:  BP 100/60   Pulse 67   Ht 6' 3 (1.905 m)   Wt 214 lb (97.1 kg)   SpO2 96%   BMI 26.75 kg/m     Wt Readings from Last 3 Encounters:  08/12/24 214 lb (97.1 kg)  02/27/24 211 lb (95.7 kg)  02/21/24 211 lb (95.7 kg)     GEN:  Well nourished, well developed in no acute distress HEENT: Normal NECK: No JVD; No carotid bruits LYMPHATICS: No lymphadenopathy CARDIAC: RRR, no murmurs, rubs, gallops RESPIRATORY:  Clear to auscultation without rales, wheezing or rhonchi  ABDOMEN: Soft, non-tender,  non-distended MUSCULOSKELETAL:  No edema; No deformity  SKIN: Warm and dry NEUROLOGIC:  Alert and oriented x 3 PSYCHIATRIC:  Normal affect   ASSESSMENT:    1. CAD in native artery   2. PFO (patent foramen ovale)   3. Mixed hyperlipidemia   4. Essential hypertension     PLAN:    In order of problems listed above:  CAD-on 08/09/2022 revealed mild nonobstructive coronary artery disease.  Continue Plavix  75 mg daily, continue Repatha  140 mg injection every 14 days, continue metoprolol  25 mg once daily, continue nitroglycerin --has not needed.k. Stable with no anginal symptoms. No indication for ischemic evaluation.    Hyperlipidemia -LPA on 09/03/2022 was 8.4, and most recent LDL was very well-managed at 21-followed by his PCP, continue Repatha  injections.  Hypertension-his blood pressure is low normal today and he is symptomatic at 100/60, he mentions he is frequently dizzy when he stands, he has also lost approximately 20 pounds over the last year and I think we will simply decrease his Benicar  to half tablet once daily.  PFO-noted on imaging in 2019, he is on Plavix  75 mg daily.  Disposition-decrease Benicar  to 10 mg daily, follow-up in 1 year.       Medication Adjustments/Labs and Tests Ordered: Current medicines are reviewed at length with the patient today.  Concerns regarding medicines are outlined above.  Orders Placed This Encounter  Procedures   EKG 12-Lead   No orders of the defined types were placed in this encounter.   Patient Instructions  Medication Instructions:  Your physician recommends that you continue on your current medications as directed. Please refer to the Current Medication list given to you today.  *If you need a refill on your cardiac medications before your next appointment, please call your pharmacy*   Lab Work: None ordered If you have labs (blood work) drawn today and your tests are completely normal, you will receive your results only  by: MyChart Message (if you have MyChart) OR A paper copy in the mail If you have any lab test that is abnormal or we need to change your treatment, we will call you to review the results.   Testing/Procedures: None ordered   Follow-Up: At [facility], you and your health needs are our priority.  As part of our continuing mission to provide you with exceptional heart care, we have created designated Provider Care Teams.  These Care Teams include your primary Cardiologist (physician) and Advanced Practice Providers (APPs -  Physician Assistants and Nurse Practitioners) who all work together to provide you with the care you need, when you need it.  We recommend signing up for the patient portal called MyChart.  Sign up information is provided on this After Visit Summary.  MyChart is used to connect with patients for Virtual Visits (Telemedicine).  Patients are able to view lab/test results, encounter notes, upcoming appointments, etc.  Non-urgent messages can be sent to your provider as well.   To learn more about what you can do with MyChart, go to [email or web address].    Your next appointment:   12 month(s)  The format for your next appointment:   In Person  Provider:   Redell Leiter, MD    Other Instructions none  Important Information About Sugar        Signed, Delon JAYSON Hoover, NP  08/12/2024 4:50 PM    [facility]

## 2024-08-12 ENCOUNTER — Encounter: Payer: Self-pay | Admitting: Cardiology

## 2024-08-12 ENCOUNTER — Ambulatory Visit: Attending: Cardiology | Admitting: Cardiology

## 2024-08-12 VITALS — BP 100/60 | HR 67 | Ht 75.0 in | Wt 214.0 lb

## 2024-08-12 DIAGNOSIS — I2510 Atherosclerotic heart disease of native coronary artery without angina pectoris: Secondary | ICD-10-CM

## 2024-08-12 DIAGNOSIS — I10 Essential (primary) hypertension: Secondary | ICD-10-CM

## 2024-08-12 DIAGNOSIS — Q2112 Patent foramen ovale: Secondary | ICD-10-CM

## 2024-08-12 DIAGNOSIS — E782 Mixed hyperlipidemia: Secondary | ICD-10-CM | POA: Diagnosis not present

## 2024-08-12 NOTE — Patient Instructions (Signed)

## 2024-08-17 ENCOUNTER — Telehealth: Payer: Self-pay | Admitting: Cardiology

## 2024-08-17 DIAGNOSIS — Q2112 Patent foramen ovale: Secondary | ICD-10-CM

## 2024-08-17 DIAGNOSIS — N5201 Erectile dysfunction due to arterial insufficiency: Secondary | ICD-10-CM | POA: Diagnosis not present

## 2024-08-17 DIAGNOSIS — N50812 Left testicular pain: Secondary | ICD-10-CM | POA: Diagnosis not present

## 2024-08-17 DIAGNOSIS — I129 Hypertensive chronic kidney disease with stage 1 through stage 4 chronic kidney disease, or unspecified chronic kidney disease: Secondary | ICD-10-CM

## 2024-08-17 DIAGNOSIS — I2510 Atherosclerotic heart disease of native coronary artery without angina pectoris: Secondary | ICD-10-CM

## 2024-08-17 DIAGNOSIS — E782 Mixed hyperlipidemia: Secondary | ICD-10-CM

## 2024-08-17 MED ORDER — OLMESARTAN MEDOXOMIL 20 MG PO TABS: 20.0000 mg | ORAL_TABLET | Freq: Two times a day (BID) | ORAL | 3 refills | Status: DC

## 2024-08-17 MED ORDER — METOPROLOL SUCCINATE ER 25 MG PO TB24: 25.0000 mg | ORAL_TABLET | Freq: Every day | ORAL | 3 refills | Status: DC

## 2024-08-17 NOTE — Telephone Encounter (Signed)
 *  STAT* If patient is at the pharmacy, call can be transferred to refill team.   1. Which medications need to be refilled? (please list name of each medication and dose if known)   metoprolol  succinate (TOPROL -XL) 25 MG 24 hr tablet  olmesartan  (BENICAR ) 20 MG tablet    2. Would you like to learn more about the convenience, safety, & potential cost savings by using the [facility]? No      3. Are you open to using the [facility] (Type [facility]. ). No    4. Which pharmacy/location (including street and city if local pharmacy) is medication to be sent to?  [facility] - [place], [place] - [address]     5. Do they need a 30 day or 90 day supply? 90 days    Need refill today

## 2024-08-17 NOTE — Telephone Encounter (Signed)
 Pt's medication was sent to pt's pharmacy as requested. Confirmation received.

## 2024-08-19 ENCOUNTER — Telehealth: Payer: Self-pay | Admitting: Cardiology

## 2024-08-19 MED ORDER — OLMESARTAN MEDOXOMIL 20 MG PO TABS: 10.0000 mg | ORAL_TABLET | Freq: Every day | ORAL | 3 refills | Status: DC

## 2024-08-19 NOTE — Telephone Encounter (Signed)
 Pt stated that Dr. Monetta decreased his medication olmesartan  20 mg tablet to 1/2 tablet daily at last office visit. This was not done. Please address

## 2024-08-19 NOTE — Telephone Encounter (Signed)
 Pt c/o medication issue:  1. Name of Medication: olmesartan  (BENICAR ) 20 MG tablet   2. How are you currently taking this medication (dosage and times per day)? Take 1 tablet (20 mg total) by mouth 2 (two) times daily.   3. Are you having a reaction (difficulty breathing--STAT)? No  4. What is your medication issue? Pt spoke with Delon Hoover during his appt on 9/3 that he is taking the olmesartan  once a day rather than twice a day and needs to have this changed on his medication history so that he can get the correct refill. Please advise.

## 2024-08-19 NOTE — Telephone Encounter (Signed)
*  STAT* If patient is at the pharmacy, call can be transferred to refill team.   1. Which medications need to be refilled? (please list name of each medication and dose if known) olmesartan  (BENICAR ) 20 MG tablet   metoprolol  succinate (TOPROL -XL) 25 MG 24 hr tablet   2. Would you like to learn more about the convenience, safety, & potential cost savings by using the [facility]? No     3. Are you open to using the [facility] (Type [facility]. ). No   4. Which pharmacy/location (including street and city if local pharmacy) is medication to be sent to? [facility] - [place], [place] - [address]     5. Do they need a 30 day or 90 day supply? 90 day  Pt needs olmesartan  refill to be sent in as 90 day supply, taking pill once daily (rather than twice daily)

## 2024-08-19 NOTE — Telephone Encounter (Signed)
 Medication corrected and sent to pharmacy. Left detailed message per DPR.

## 2024-09-01 ENCOUNTER — Other Ambulatory Visit: Payer: Self-pay

## 2024-09-01 DIAGNOSIS — M50123 Cervical disc disorder at C6-C7 level with radiculopathy: Secondary | ICD-10-CM | POA: Diagnosis not present

## 2024-09-01 MED ORDER — REPATHA SURECLICK 140 MG/ML ~~LOC~~ SOAJ: 1.0000 mL | SUBCUTANEOUS | 3 refills | Status: DC

## 2024-09-03 ENCOUNTER — Other Ambulatory Visit: Payer: Self-pay

## 2024-09-03 MED ORDER — OLMESARTAN MEDOXOMIL 20 MG PO TABS: 20.0000 mg | ORAL_TABLET | Freq: Every day | ORAL | 3 refills | Status: DC

## 2024-09-03 NOTE — Telephone Encounter (Signed)
 Benicar  20mg  1 tablet daily

## 2024-10-15 ENCOUNTER — Other Ambulatory Visit: Payer: Self-pay | Admitting: Cardiology

## 2024-11-16 ENCOUNTER — Encounter: Payer: Self-pay | Admitting: Cardiology

## 2024-11-16 DIAGNOSIS — I2510 Atherosclerotic heart disease of native coronary artery without angina pectoris: Secondary | ICD-10-CM

## 2024-11-16 DIAGNOSIS — E782 Mixed hyperlipidemia: Secondary | ICD-10-CM

## 2024-11-16 DIAGNOSIS — I129 Hypertensive chronic kidney disease with stage 1 through stage 4 chronic kidney disease, or unspecified chronic kidney disease: Secondary | ICD-10-CM

## 2024-11-16 DIAGNOSIS — Q2112 Patent foramen ovale: Secondary | ICD-10-CM

## 2024-11-16 MED ORDER — OLMESARTAN MEDOXOMIL 20 MG PO TABS: 20.0000 mg | ORAL_TABLET | Freq: Every day | ORAL | 2 refills | Status: AC

## 2024-11-16 MED ORDER — METOPROLOL SUCCINATE ER 25 MG PO TB24: 25.0000 mg | ORAL_TABLET | Freq: Every day | ORAL | 2 refills | Status: AC

## 2024-11-16 MED ORDER — CLOPIDOGREL BISULFATE 75 MG PO TABS: 75.0000 mg | ORAL_TABLET | Freq: Every day | ORAL | 2 refills | Status: DC

## 2024-11-16 MED ORDER — REPATHA SURECLICK 140 MG/ML ~~LOC~~ SOAJ: 1.0000 mL | SUBCUTANEOUS | 2 refills | Status: DC

## 2024-12-09 ENCOUNTER — Telehealth: Payer: Self-pay | Admitting: Cardiology

## 2024-12-09 ENCOUNTER — Telehealth: Payer: Self-pay | Admitting: Pharmacy Technician

## 2024-12-09 ENCOUNTER — Other Ambulatory Visit (HOSPITAL_COMMUNITY): Payer: Self-pay

## 2024-12-09 MED ORDER — REPATHA SURECLICK 140 MG/ML ~~LOC~~ SOAJ: 1.0000 mL | SUBCUTANEOUS | 5 refills | Status: AC

## 2024-12-09 NOTE — Telephone Encounter (Signed)
 Pt trying to get Rx today with this years insurance.

## 2024-12-09 NOTE — Telephone Encounter (Signed)
 Pt was told by pharmacy he would need prior auth to refill repatha . He is trying to get Rx today with his insurance from this year. Please advise.

## 2024-12-09 NOTE — Telephone Encounter (Signed)
 Received notification from office that prior auth was needed- refill too soon until 12/10/24- prior auth was to fill early

## 2024-12-09 NOTE — Telephone Encounter (Signed)
 The reason it is getting rejected at the pharmacy is because it is too soon to fill. His plan will not pay for another refill until 12/10/24 which unfortunately will not do him any good if he is trying to pick up the med before the end of the year.

## 2024-12-09 NOTE — Telephone Encounter (Signed)
" °*  STAT* If patient is at the pharmacy, call can be transferred to refill team.   1. Which medications need to be refilled? (please list name of each medication and dose if known)   Evolocumab  (REPATHA  SURECLICK) 140 MG/ML SOAJ     2. Would you like to learn more about the convenience, safety, & potential cost savings by using the [facility]? No    3. Are you open to using the [facility] (Type [facility]. No    4. Which pharmacy/location (including street and city if local pharmacy) is medication to be sent to? [facility] - [place], [place] - [address]        5. Do they need a 30 day or 90 day supply? 90 day   "

## 2024-12-09 NOTE — Telephone Encounter (Signed)
 Refills has been sent to the pharmacy.

## 2024-12-09 NOTE — Telephone Encounter (Signed)
 S/w the patient- gave him the information from the pharmacist. He reports that his pharmacy told him that insurance would refill it on 12/28, then they tried to refill it after the 28th and it said they needed an authoriztion.   Informed him that I am unsure why there is a discrepancy. He is going to call his insurance company. He wants to get as many refills as possible before tomorrow because he has met his deductible this year.   I apologized for all of the confusion and frustration. I wished him luck and told him to call back if we can help with anything after he speaks with his insurance. He verbalized understanding.

## 2024-12-16 ENCOUNTER — Inpatient Hospital Stay (HOSPITAL_COMMUNITY)
Admission: EM | Admit: 2024-12-16 | Discharge: 2024-12-18 | DRG: 281 | Disposition: A | Discharge status: Home / Self Care

## 2024-12-16 ENCOUNTER — Emergency Department (HOSPITAL_COMMUNITY)

## 2024-12-16 ENCOUNTER — Encounter (HOSPITAL_COMMUNITY): Payer: Self-pay

## 2024-12-16 DIAGNOSIS — G4733 Obstructive sleep apnea (adult) (pediatric): Secondary | ICD-10-CM | POA: Diagnosis present

## 2024-12-16 DIAGNOSIS — N183 Chronic kidney disease, stage 3 unspecified: Secondary | ICD-10-CM | POA: Diagnosis not present

## 2024-12-16 DIAGNOSIS — Z83438 Family history of other disorder of lipoprotein metabolism and other lipidemia: Secondary | ICD-10-CM

## 2024-12-16 DIAGNOSIS — E782 Mixed hyperlipidemia: Secondary | ICD-10-CM | POA: Diagnosis present

## 2024-12-16 DIAGNOSIS — E669 Obesity, unspecified: Secondary | ICD-10-CM | POA: Diagnosis present

## 2024-12-16 DIAGNOSIS — Z87442 Personal history of urinary calculi: Secondary | ICD-10-CM

## 2024-12-16 DIAGNOSIS — Z79890 Hormone replacement therapy (postmenopausal): Secondary | ICD-10-CM

## 2024-12-16 DIAGNOSIS — Z6827 Body mass index (BMI) 27.0-27.9, adult: Secondary | ICD-10-CM | POA: Diagnosis not present

## 2024-12-16 DIAGNOSIS — I129 Hypertensive chronic kidney disease with stage 1 through stage 4 chronic kidney disease, or unspecified chronic kidney disease: Secondary | ICD-10-CM | POA: Diagnosis present

## 2024-12-16 DIAGNOSIS — Z79899 Other long term (current) drug therapy: Secondary | ICD-10-CM | POA: Diagnosis not present

## 2024-12-16 DIAGNOSIS — Z881 Allergy status to other antibiotic agents status: Secondary | ICD-10-CM | POA: Diagnosis not present

## 2024-12-16 DIAGNOSIS — I200 Unstable angina: Secondary | ICD-10-CM | POA: Diagnosis present

## 2024-12-16 DIAGNOSIS — Z981 Arthrodesis status: Secondary | ICD-10-CM | POA: Diagnosis not present

## 2024-12-16 DIAGNOSIS — Z888 Allergy status to other drugs, medicaments and biological substances status: Secondary | ICD-10-CM | POA: Diagnosis not present

## 2024-12-16 DIAGNOSIS — H9319 Tinnitus, unspecified ear: Secondary | ICD-10-CM | POA: Diagnosis present

## 2024-12-16 DIAGNOSIS — K219 Gastro-esophageal reflux disease without esophagitis: Secondary | ICD-10-CM | POA: Diagnosis present

## 2024-12-16 DIAGNOSIS — Z87892 Personal history of anaphylaxis: Secondary | ICD-10-CM

## 2024-12-16 DIAGNOSIS — Q2112 Patent foramen ovale: Secondary | ICD-10-CM | POA: Diagnosis not present

## 2024-12-16 DIAGNOSIS — Z7902 Long term (current) use of antithrombotics/antiplatelets: Secondary | ICD-10-CM | POA: Diagnosis not present

## 2024-12-16 DIAGNOSIS — I25110 Atherosclerotic heart disease of native coronary artery with unstable angina pectoris: Secondary | ICD-10-CM | POA: Diagnosis not present

## 2024-12-16 DIAGNOSIS — R079 Chest pain, unspecified: Principal | ICD-10-CM

## 2024-12-16 DIAGNOSIS — H9190 Unspecified hearing loss, unspecified ear: Secondary | ICD-10-CM | POA: Diagnosis present

## 2024-12-16 DIAGNOSIS — E039 Hypothyroidism, unspecified: Secondary | ICD-10-CM | POA: Diagnosis present

## 2024-12-16 DIAGNOSIS — Z9049 Acquired absence of other specified parts of digestive tract: Secondary | ICD-10-CM

## 2024-12-16 DIAGNOSIS — Z8551 Personal history of malignant neoplasm of bladder: Secondary | ICD-10-CM | POA: Diagnosis not present

## 2024-12-16 DIAGNOSIS — Z96643 Presence of artificial hip joint, bilateral: Secondary | ICD-10-CM | POA: Diagnosis present

## 2024-12-16 DIAGNOSIS — F419 Anxiety disorder, unspecified: Secondary | ICD-10-CM | POA: Diagnosis not present

## 2024-12-16 DIAGNOSIS — I2510 Atherosclerotic heart disease of native coronary artery without angina pectoris: Secondary | ICD-10-CM | POA: Diagnosis present

## 2024-12-16 DIAGNOSIS — Z8546 Personal history of malignant neoplasm of prostate: Secondary | ICD-10-CM | POA: Diagnosis not present

## 2024-12-16 DIAGNOSIS — Z886 Allergy status to analgesic agent status: Secondary | ICD-10-CM | POA: Diagnosis not present

## 2024-12-16 DIAGNOSIS — N1831 Chronic kidney disease, stage 3a: Secondary | ICD-10-CM | POA: Diagnosis present

## 2024-12-16 DIAGNOSIS — Z8679 Personal history of other diseases of the circulatory system: Secondary | ICD-10-CM | POA: Diagnosis not present

## 2024-12-16 DIAGNOSIS — Z8249 Family history of ischemic heart disease and other diseases of the circulatory system: Secondary | ICD-10-CM

## 2024-12-16 DIAGNOSIS — I214 Non-ST elevation (NSTEMI) myocardial infarction: Principal | ICD-10-CM | POA: Diagnosis present

## 2024-12-16 DIAGNOSIS — E785 Hyperlipidemia, unspecified: Secondary | ICD-10-CM | POA: Diagnosis not present

## 2024-12-16 DIAGNOSIS — F410 Panic disorder [episodic paroxysmal anxiety] without agoraphobia: Secondary | ICD-10-CM | POA: Diagnosis present

## 2024-12-16 DIAGNOSIS — Z87891 Personal history of nicotine dependence: Secondary | ICD-10-CM

## 2024-12-16 DIAGNOSIS — Z7901 Long term (current) use of anticoagulants: Secondary | ICD-10-CM | POA: Diagnosis not present

## 2024-12-16 HISTORY — DX: Unstable angina: I20.0

## 2024-12-16 LAB — CBC
HCT: 43.6 % (ref 39.0–52.0)
Hemoglobin: 15.5 g/dL (ref 13.0–17.0)
MCH: 30.9 pg (ref 26.0–34.0)
MCHC: 35.6 g/dL (ref 30.0–36.0)
MCV: 87 fL (ref 80.0–100.0)
Platelets: 229 K/uL (ref 150–400)
RBC: 5.01 MIL/uL (ref 4.22–5.81)
RDW: 12.3 % (ref 11.5–15.5)
WBC: 5.1 K/uL (ref 4.0–10.5)
nRBC: 0 % (ref 0.0–0.2)

## 2024-12-16 LAB — HEPARIN LEVEL (UNFRACTIONATED): Heparin Unfractionated: 0.47 [IU]/mL (ref 0.30–0.70)

## 2024-12-16 LAB — BASIC METABOLIC PANEL WITH GFR
Anion gap: 14 (ref 5–15)
BUN: 13 mg/dL (ref 8–23)
CO2: 21 mmol/L — ABNORMAL LOW (ref 22–32)
Calcium: 10 mg/dL (ref 8.9–10.3)
Chloride: 102 mmol/L (ref 98–111)
Creatinine, Ser: 1.48 mg/dL — ABNORMAL HIGH (ref 0.61–1.24)
GFR, Estimated: 53 mL/min — ABNORMAL LOW
Glucose, Bld: 108 mg/dL — ABNORMAL HIGH (ref 70–99)
Potassium: 4.3 mmol/L (ref 3.5–5.1)
Sodium: 137 mmol/L (ref 135–145)

## 2024-12-16 LAB — TROPONIN T, HIGH SENSITIVITY
Troponin T High Sensitivity: <15 ng/L (ref 0–19)
Troponin T High Sensitivity: 28 ng/L — ABNORMAL HIGH (ref 0–19)
Troponin T High Sensitivity: 46 ng/L — ABNORMAL HIGH (ref 0–19)
Troponin T High Sensitivity: 78 ng/L — ABNORMAL HIGH (ref 0–19)

## 2024-12-16 MED ORDER — IRBESARTAN 150 MG PO TABS
150.0000 mg | ORAL_TABLET | Freq: Every day | ORAL | Status: DC
  Administered 2024-12-18: 150 mg via ORAL
  Filled 2024-12-16: qty 1

## 2024-12-16 MED ORDER — ASPIRIN 81 MG PO TBEC: 81.0000 mg | DELAYED_RELEASE_TABLET | Freq: Every day | ORAL | Status: DC

## 2024-12-16 MED ORDER — NITROGLYCERIN 0.4 MG SL SUBL
0.4000 mg | SUBLINGUAL_TABLET | SUBLINGUAL | Status: AC
  Administered 2024-12-16: 0.4 mg via SUBLINGUAL
  Filled 2024-12-16: qty 1

## 2024-12-16 MED ORDER — METOPROLOL SUCCINATE ER 25 MG PO TB24
25.0000 mg | ORAL_TABLET | Freq: Every day | ORAL | Status: DC
  Administered 2024-12-18: 25 mg via ORAL
  Filled 2024-12-16: qty 1

## 2024-12-16 MED ORDER — POLYETHYLENE GLYCOL 3350 17 G PO PACK: 17.0000 g | PACK | Freq: Every day | ORAL | Status: DC | PRN

## 2024-12-16 MED ORDER — PANTOPRAZOLE SODIUM 20 MG PO TBEC
20.0000 mg | DELAYED_RELEASE_TABLET | Freq: Every day | ORAL | Status: DC
  Administered 2024-12-16 – 2024-12-17 (×2): 20 mg via ORAL
  Filled 2024-12-16 (×2): qty 1

## 2024-12-16 MED ORDER — CLOPIDOGREL BISULFATE 75 MG PO TABS: 75.0000 mg | ORAL_TABLET | Freq: Every day | ORAL | Status: DC

## 2024-12-16 MED ORDER — CLOPIDOGREL BISULFATE 75 MG PO TABS
75.0000 mg | ORAL_TABLET | Freq: Every day | ORAL | Status: DC
  Administered 2024-12-16 – 2024-12-17 (×2): 75 mg via ORAL
  Filled 2024-12-16 (×2): qty 1

## 2024-12-16 MED ORDER — HEPARIN BOLUS VIA INFUSION
4000.0000 [IU] | Freq: Once | INTRAVENOUS | Status: AC
  Administered 2024-12-16: 4000 [IU] via INTRAVENOUS
  Filled 2024-12-16: qty 4000

## 2024-12-16 MED ORDER — HEPARIN (PORCINE) 25000 UT/250ML-% IV SOLN
1400.0000 [IU]/h | INTRAVENOUS | Status: DC
  Administered 2024-12-16 – 2024-12-17 (×2): 1400 [IU]/h via INTRAVENOUS
  Filled 2024-12-16 (×2): qty 250

## 2024-12-16 MED ORDER — NITROGLYCERIN 0.4 MG SL SUBL: 0.4000 mg | SUBLINGUAL_TABLET | SUBLINGUAL | Status: DC | PRN

## 2024-12-16 MED ORDER — ACETAMINOPHEN 325 MG PO TABS
650.0000 mg | ORAL_TABLET | Freq: Four times a day (QID) | ORAL | Status: DC | PRN
  Administered 2024-12-18: 650 mg via ORAL
  Filled 2024-12-16: qty 2

## 2024-12-16 MED ORDER — NITROGLYCERIN 2 % TD OINT
0.5000 [in_us] | TOPICAL_OINTMENT | Freq: Four times a day (QID) | TRANSDERMAL | Status: DC
  Filled 2024-12-16 (×3): qty 1

## 2024-12-16 MED ORDER — ACETAMINOPHEN 650 MG RE SUPP: 650.0000 mg | Freq: Four times a day (QID) | RECTAL | Status: DC | PRN

## 2024-12-16 MED ORDER — LEVOTHYROXINE SODIUM 75 MCG PO TABS
75.0000 ug | ORAL_TABLET | Freq: Every day | ORAL | Status: DC
  Administered 2024-12-17 – 2024-12-18 (×2): 75 ug via ORAL
  Filled 2024-12-16 (×2): qty 1

## 2024-12-16 NOTE — ED Notes (Signed)
 Went to room to administer SL nitroglycerin , patient reported chest pain resolved. Med not given at this time.

## 2024-12-16 NOTE — ED Triage Notes (Signed)
 Pt BIB EMS from home with c/o cebtral chest pain that radiates to left arm and back. Pt took 1 nitroglycerin  at home, and 324 aspirin  PTA. Pt has tremors and hx of anxiety. Pt took xanax  at home.   170/108 Hr 64 100% room air  CBG 121

## 2024-12-16 NOTE — H&P (View-Only) (Signed)
 "  Cardiology Consultation   Patient ID: Lee Stevens MRN: [id number]; DOB: 01-08-1961  Admit date: 12/16/2024 Date of Consult: 12/16/2024  PCP:  Ofilia Lamar CROME, MD   [facility] Providers Cardiologist:  Redell Leiter, MD        Patient Profile: Lee Stevens is a 64 y.o. male with a hx of nonobstructive CAD, HLD intolerant to statins, HTN, PFO, CKD stage III, s/p C5-C7 ACDF [07/2024], GERD, OSA, anxiety who is being seen 12/16/2024 for the evaluation of chest pain at the request of Dr. Jone Door.  History of Present Illness: Lee Stevens has medical history as stated above. Of note, patient has longstanding history of intermittent chest pain dating back several years. Coronary CT angiogram with FFR revealed moderate diffuse CAD without hemodynamically significant stenosis [11/14/18]. LHC in 07/2022 showed mid LAD lesion with 40%. Has followed outpatient cardiology with Dr. Leiter, stable on medical management but has continued to have ongoing intermittent chest pain.  He was BIB EMS from home complaining of retrosternal pleuritic chest pain with radiation to the left armpit and neck/upper back that started at 6:30 AM this morning. He took Pepcid thinking it was indigestion but did not have any relief. He then tried sitting down and resting for 15 minutes but still did not have any pain relief. He then took a dose of Xanax , for which he was prescribed for anxiety/panic attacks, and NTG. He called his daughter who is an ICU nurse who advised him to take ASA 81 mg x 2 doses and call 911. EMS arrived shortly after their call at 8 AM. He was given another NTG and ASA 81 x 2 more doses (total 324 mg) en route to the ED, after which he states that his chest pain went from 8/10 in severity to 5/10. Reports nausea but denies any SOB, palpitations, diaphoresis associated with his chest pain. Patient was hypertensive at 170/108 on arrival, vitals otherwise unremarkable. He states that his chest pain  resolved after he was sent back to the ED waiting room following triage assessment (had lasted for about 3-4 hours at that point). The pain did come back a couple more times while waiting in the ED, with 4 total episodes today.  Relevant ED workup: ED EKG showed sinus rhythm with LAFB, rate 65 bpm. Troponins initially undetectably low, then 28 >> 46 >> 78. Creatinine at 1.48 (baseline 1.49 per annual physical lab work in 11/2024). Otherwise, CBC and BMP unremarkable. No active cardiopulmonary disease on CXR. Echo pending.  Upon speaking to the patient today, he states that he has had several years-long history of sporadically chest pain, up to 2-3 episodes in a 6 month timeframe. However, he states that this chest pain today was different from his usual chest pain and concerned him enough to take NTG for the first time and call 911. Does not take any long-acting nitrate at home. Reports cardiac family history of father with first MI in his 66s and fatal MI at age 32.   Past Medical History:  Diagnosis Date   Allergic rhinitis    Anticoagulant long-term use    plavix ---   managed by cardiology   Anxiety disorder 2018   Arthritis    neck   Atypical chest pain 09/25/2018   followed by cardiology-- dr leiter;  continue's to monitor for long history per dr leiter note pt had cardiac cath in 2008 w/ luminal irregularity;  normal stress echo 04/ 2017;   Benign hypertension 01/19/2016  Bladder tumor 05/2023   BPH associated with nocturia    CAD in native artery 12/01/2018   cardiologist--- dr monetta;   coronary CT FFR/ morph 11-14-2018  calcium  score=514 involving RCA/ LAD/ PDA/ PL;   cardiac cath 08-09-2022  nonob CAD mild LAD 40%   Cancer (HCC)    bladder   Chronic kidney disease, stage 3 (HCC) 04/19/2017   folllowed by pcp   ETD (Eustachian tube dysfunction), right    GERD (gastroesophageal reflux disease)    History of anal fissures 04/10/2016   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 georgina (note in care everywhere )---  06/ 2024 parotid right greater than left ,  resolved without intervention   Prostate nodule 04/25/2023   Right lower quadrant abdominal pain 05/09/2023   Subclinical hypothyroidism    Tinnitus of both ears 11/26/2018   Wears glasses     Past Surgical History:  Procedure Laterality Date   ANAL FISSURE REPAIR  07/06/2016   @[facility]  BY DR CANDIE RIDING;   ENDORECTAL ADVANCEMENT FLAP 07-06-2016  AND RE-DO 10-09-2016   CARDIAC CATHETERIZATION  2008   per pt's cardiologist-- dr monetta-- note , luminal irregurilities   CHOLECYSTECTOMY, LAPAROSCOPIC  2008   COLONOSCOPY  2016   COLOSTOMY TAKEDOWN  06/02/2018   @[facility] ;   ILEOSTOMY TAKEDOWN W/ ANORECTAL EUA   EXPLORATORY LAPAROTOMY W/ BOWEL RESECTION  06/28/2017   @[facility] ;   SMALL BOWEL RESECTION W/ ILEOSTOMY REVISION   EXTRACORPOREAL SHOCK WAVE LITHOTRIPSY  2006   LAPAROSCOPIC APPENDECTOMY  2000   LAPAROSCOPIC LOOP COLOSTOMY  06/07/2017   @[facility]  BY DR S. FULLER;   LOOP ILEOSTOMY CREATION   LEFT HEART CATH AND CORONARY ANGIOGRAPHY N/A 08/09/2022   Procedure: LEFT HEART CATH AND CORONARY ANGIOGRAPHY;  Surgeon: Dann Candyce RAMAN, MD;  Location: [facility];  Service: Cardiovascular;  Laterality: N/A;   PELVIC LYMPH NODE DISSECTION Bilateral 02/27/2024   Procedure: BILATERAL PELVIC LYMPH NODE DISSECTION;  Surgeon: Renda Glance, MD;  Location: [facility];  Service: Urology;  Laterality: Bilateral;   PLACEMENT OF SETON     @[facility] ;   08-29-2016  AND 11-05-2016   PROSTATE BIOPSY     RECTAL EXAM UNDER  ANESTHESIA  03/07/2018   @[facility]    ROBOT ASSISTED LAPAROSCOPIC RADICAL PROSTATECTOMY N/A 02/27/2024   Procedure: XI ROBOTIC ASSISTED LAPAROSCOPIC RADICAL PROSTATECTOMY LEVEL 3, LYSIS OF ADHESIONS;  Surgeon: Renda Glance, MD;  Location: [facility];  Service: Urology;  Laterality: N/A;  210 MINUTES   TONSILLECTOMY AND ADENOIDECTOMY     child   TOTAL HIP ARTHROPLASTY Bilateral 2011   6 months apart same year   TRANSRECTAL ULTRASOUND N/A 11/19/2023   Procedure: TRANSRECTAL ULTRASOUND WITH PROSTATE BIOPSY;  Surgeon: Lovie Arlyss CROME, MD;  Location: [facility];  Service: Urology;  Laterality: N/A;   TRANSURETHRAL RESECTION OF BLADDER TUMOR WITH MITOMYCIN -C N/A 06/26/2023   Procedure: TRANSURETHRAL RESECTION OF BLADDER TUMOR WITH INTRAOPERATIVE INSTILLATION OF GEMCITABINE ;  Surgeon: Lovie Arlyss CROME, MD;  Location: [facility];  Service: Urology;  Laterality: N/A;  25 MINUTES NEEDED FOR CASE   TURBINATE REDUCTION  05/03/2023   @[facility]  by dr d. georgina;   BILATERAL INFERIOR       Scheduled Meds:  clopidogrel   75 mg Oral Daily   [START ON 12/17/2024] irbesartan   150 mg Oral Daily   [START ON 12/17/2024] levothyroxine   75 mcg Oral QAC breakfast   [START ON 12/17/2024] metoprolol  succinate  25 mg Oral Daily   nitroGLYCERIN   0.5 inch Topical Q6H   pantoprazole   20 mg Oral Daily   Continuous Infusions:  heparin  1,400 Units/hr (12/16/24 1654)   PRN Meds: acetaminophen  **OR** acetaminophen , nitroGLYCERIN , polyethylene glycol  Allergies:   Allergies[1]  Social History:   Social History   Socioeconomic History   Marital status: Married    Spouse name: Not on file   Number of children: Not on file   Years of education: Not on file   Highest education level: Not on file  Occupational History   Not on file  Tobacco Use   Smoking status: Never   Smokeless tobacco: Former    Types: Snuff    Quit date: 2004  Vaping Use   Vaping status: Never Used  Substance and Sexual  Activity   Alcohol use: Not Currently   Drug use: Never   Sexual activity: Not on file  Other Topics Concern   Not on file  Social History Narrative   Not on file   Social Drivers of Health   Tobacco Use: Medium Risk (12/16/2024)   Patient History    Smoking Tobacco Use: Never    Smokeless Tobacco Use: Former    Passive Exposure: Not on Actuary Strain: Low Risk (10/12/2024)   Received from [facility]   Overall Financial Resource Strain (CARDIA)    How hard is it for you to pay for the very basics like food, housing, medical care, and heating?: Not hard at all  Food Insecurity: Low Risk (11/12/2024)   Received from [facility]   Epic    Within the past 12 months, you worried that your food would run out before you got money to buy more: Never true    Within the past 12 months, the food you bought just didn't last and you didn't have money to get more. : Never true  Transportation Needs: No Transportation Needs (11/12/2024)   Received from [facility]    In the past 12 months, has lack of reliable transportation kept you from medical appointments, meetings, work or from getting things needed for daily living? : No  Physical Activity: Insufficiently Active (10/12/2024)   Received from [facility]   Exercise Vital Sign    On average, how many days per week do you engage in moderate to strenuous exercise (like a brisk walk)?: 1 day    On average, how many minutes do you engage in exercise at this level?: 30 min  Stress: No Stress Concern Present (10/12/2024)   Received from [facility] - Occupational Stress Questionnaire    Do you feel stress - tense, restless, nervous, or anxious, or unable to sleep at night because your mind is troubled all the time - these days?: Only a little  Social Connections: Socially Integrated (10/12/2024)   Received from [facility]   Social Network    How would you rate  your social network (family, work, friends)?: Good participation with social networks  Intimate  Partner Violence: Not At Risk (10/12/2024)   Received from [facility]   HITS    Over the last 12 months how often did your partner physically hurt you?: Never    Over the last 12 months how often did your partner insult you or talk down to you?: Never    Over the last 12 months how often did your partner threaten you with physical harm?: Never    Over the last 12 months how often did your partner scream or curse at you?: Never  Depression (PHQ2-9): Low Risk (12/17/2023)   Depression (PHQ2-9)    PHQ-2 Score: 0  Alcohol Screen: Low Risk (12/17/2023)   Alcohol Screen    Last Alcohol Screening Score (AUDIT): 0  Housing: Low Risk (11/12/2024)   Received from [facility]   Epic    What is your living situation today?: I have a steady place to live    Think about the place you live. Do you have problems with any of the following? Choose all that apply:: None/None on this list  Utilities: Low Risk (11/12/2024)   Received from [facility]   Utilities    In the past 12 months has the electric, gas, oil, or water  company threatened to shut off services in your home? : No  Health Literacy: Not on file    Family History:    Family History  Problem Relation Age of Onset   Hypertension Mother    Heart attack Father    Hyperlipidemia Father      ROS:  Please see the history of present illness.   All other ROS reviewed and negative.     Physical Exam/Data: Vitals:   12/16/24 1600 12/16/24 1630 12/16/24 1837 12/16/24 1900  BP: 138/78 106/68 (!) 140/86 139/76  Pulse:  61 (!) 58 (!) 58  Resp: 12 13 14 10   Temp:  98.2 F (36.8 C)    TempSrc:  Oral    SpO2: 100% 92% 100% 98%   No intake or output data in the 24 hours ending 12/16/24 1940    08/12/2024    3:37 PM 02/27/2024    8:55 AM 02/21/2024    8:56 AM  Last 3 Weights  Weight (lbs) 214 lb 211 lb 211 lb  Weight (kg) 97.07 kg 95.709 kg  95.709 kg     There is no height or weight on file to calculate BMI.  General: Well nourished, well developed, in no acute distress Vascular: No carotid bruits; Distal pulses 2+ bilaterally Cardiac: Normal S1, S2; RRR; no murmur  Lungs: Clear to auscultation bilaterally, no wheezing, rhonchi or rales  Abd: Soft, nontender Ext: No peripheral edema Musculoskeletal: No deformities Skin: Warm and dry  Neuro: No focal abnormalities noted Psych: Normal affect   EKG: The ED EKG was personally reviewed and demonstrates: Sinus rhythm with LAFB, rate 65 bpm, unchanged from prior EKG  Relevant CV Studies:  LHC [08/09/22]: Mid LAD lesion is 40% stenosed. LV end diastolic pressure is mildly elevated.  LVEDP 20 mm Hg. There is no aortic valve stenosis. IMPRESSION: Mild, nonobstructive coronary artery disease.  Very large, dominant right coronary artery which feeds the apex.  LAD and its branches are generally small and diffusely diseased.  Continue medical therapy.  We spoke about the importance of healthy diet, regular exercise.   Laboratory Data: High Sensitivity Troponin:  No results for input(s): TROPONINIHS in the last 720 hours.  Recent Labs  Lab 12/16/24 [phone] 12/16/24 1221 12/16/24 1422 12/16/24 1645  TRNPT <15 28* 46* 78*      Chemistry Recent Labs  Lab 12/16/24 0942  NA 137  K 4.3  CL 102  CO2 21*  GLUCOSE 108*  BUN 13  CREATININE 1.48*  CALCIUM  10.0  GFRNONAA 53*  ANIONGAP 14    No results for input(s): PROT, ALBUMIN, AST, ALT, ALKPHOS, BILITOT in the last 168 hours. Lipids No results for input(s): CHOL, TRIG, HDL, LABVLDL, LDLCALC, CHOLHDL in the last 168 hours.  Hematology Recent Labs  Lab 12/16/24 0942  WBC 5.1  RBC 5.01  HGB 15.5  HCT 43.6  MCV 87.0  MCH 30.9  MCHC 35.6  RDW 12.3  PLT 229   Thyroid No results for input(s): TSH, FREET4 in the last 168 hours.  BNPNo results for input(s): BNP, PROBNP in the last 168  hours.  DDimer No results for input(s): DDIMER in the last 168 hours.  Radiology/Studies:  DG Chest 2 View Result Date: 12/16/2024 CLINICAL DATA:  Chest pain. EXAM: CHEST - 2 VIEW COMPARISON:  01/06/2023 FINDINGS: Heart size is stable and within normal limits. Trachea is midline. Surgical plate in the lower cervical spine. No large pleural effusions. No acute bone abnormality. IMPRESSION: No active cardiopulmonary disease. Electronically Signed   By: Juliene Balder M.D.   On: 12/16/2024 10:10    Assessment and Plan: NSTEMI Nonobstructive CAD Presented with 4 total episodes of intermittent retrosternal chest pain radiating to the left arm and neck/upper back ED EKG showed sinus rhythm with LAFB, rate 65 bpm, unchanged from prior EKG Troponins initially undetectably low, then 28 >> 46 >> 78 No active cardiopulmonary disease on CXR Echo pending, no prior echo for review/comparison LHC [08/09/22]: Mid LAD lesion with 40% stenosis, recommended medical therapy  Given pt with longstanding history of intermittent chest pain presenting with new chest pain that is different in severity and character, suspect unstable angina and plan for cardiac catheterization Continue heparin  drip Continue home Plavix  75 mg daily, metoprolol  succinate 25 mg daily No ASA due to tinnitus Apply topical NTG as needed for pain  Hypertension BP stable, continue home antihypertensives  Hyperlipidemia Known intolerance to statins Home meds: Repatha   Per primary CKD stage III GERD OSA   Risk Assessment/Risk Scores:    TIMI Risk Score for Unstable Angina or Non-ST Elevation MI:   The patient's TIMI risk score is 4, which indicates a 20% risk of all cause mortality, new or recurrent myocardial infarction or need for urgent revascularization in the next 14 days.    For questions or updates, please contact [facility] Please consult [email or web address] for contact info under      Signed, Owen MARLA Daniels, PA-C   12/16/2024 7:40 PM  I have personally seen and examined this patient. I agree with the assessment and plan as outlined above.  64 yo male with h/o HTN, HLD, OSA, CKD and CAD with chest pain. His pain has occurred mostly at rest, on and off for the past day. Troponin is mildly elevated. EKG with sinus, no ischemic changes.  Labs reviewed by me Tele: sinus  My exam:  General: Well developed, well nourished, NAD  SKIN: warm, dry. Neuro: No focal deficits  Psychiatric: Mood and affect normal  Neck: No JVD Lungs:Clear bilaterally, no wheezes, rhonci, crackles Cardiovascular: Regular rate and rhythm. No murmurs, gallops or rubs. Abdomen:Soft.  Extremities: No lower extremity edema.    Plan: NSTEMI: He is known to have mild CAD by cath in 2023. His current presentation is  concerning for ACS.  Cardiac cath is indicated.   I have reviewed the risks, indications, and alternatives to cardiac catheterization, possible angioplasty, and stenting with the patient. Risks include but are not limited to bleeding, infection, vascular injury, stroke, myocardial infection, arrhythmia, kidney injury, radiation-related injury in the case of prolonged fluoroscopy use, emergency cardiac surgery, and death. The patient understands the risks of serious complication is 1-2 in 1000 with diagnostic cardiac cath and 1-2% or less with angioplasty/stenting. Continue IV heparin  Continue ASA. He is statin intolerant Cardiac cath with possible PCI tomorrow.   Lonni Cash, MD, [facility] 12/16/2024 8:10 PM      [1]  Allergies Allergen Reactions   Keflex [Cephalexin] Anaphylaxis and Rash   Hydrochlorothiazide     Joint Pain.    Aspirin      tinnitus   Crestor [Rosuvastatin]     Myalgias (intolerance)     "

## 2024-12-16 NOTE — Progress Notes (Signed)
 PHARMACY - ANTICOAGULATION CONSULT NOTE  Pharmacy Consult for Heparin  Indication: chest pain/ACS  Allergies[1]  Patient Measurements:   Ht 75 in Wt ~97.5 kg  Vital Signs: Temp: 98 F (36.7 C) (01/07 1319) Temp Source: Oral (01/07 0924) BP: 138/78 (01/07 1600) Pulse Rate: 63 (01/07 1509)  Labs: Recent Labs    12/16/24 0942  HGB 15.5  HCT 43.6  PLT 229  CREATININE 1.48*    CrCl cannot be calculated (Unknown ideal weight.).   Medical History: Past Medical History:  Diagnosis Date   Allergic rhinitis    Anticoagulant long-term use    plavix ---   managed by cardiology   Anxiety disorder 2018   Arthritis    neck   Atypical chest pain 09/25/2018   followed by cardiology-- dr monetta;  continue's to monitor for long history per dr monetta note pt had cardiac cath in 2008 w/ luminal irregularity;  normal stress echo 04/ 2017;   Benign hypertension 01/19/2016   Bladder tumor 05/2023   BPH associated with nocturia    CAD in native artery 12/01/2018   cardiologist--- dr monetta;   coronary CT FFR/ morph 11-14-2018  calcium  score=514 involving RCA/ LAD/ PDA/ PL;   cardiac cath 08-09-2022  nonob CAD mild LAD 40%   Cancer (HCC)    bladder   Chronic kidney disease, stage 3 (HCC) 04/19/2017   folllowed by pcp   ETD (Eustachian tube dysfunction), right    GERD (gastroesophageal reflux disease)    History of anal fissures 04/10/2016   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 georgina (note in care everywhere )---  06/ 2024 parotid right greater than left ,  resolved without  intervention   Prostate nodule 04/25/2023   Right lower quadrant abdominal pain 05/09/2023   Subclinical hypothyroidism    Tinnitus of both ears 11/26/2018   Wears glasses     Medications:  Awaiting home med rec   Assessment: 64 y.o. M prsents with CP. To begin heparin  for r/o ACS. No AC PTA. CBC ok on admission.  Goal of Therapy:  Heparin  level 0.3-0.7 units/ml Monitor platelets by anticoagulation protocol: Yes   Plan:  Heparin  IV bolus 4000 units Heparin  gtt at 1400 units/hr Will f/u heparin  level in 6 hours Daily heparin  level and CBC  Vito Ralph, PharmD, BCPS Please see amion for complete clinical pharmacist phone list 12/16/2024,4:11 PM      [1]  Allergies Allergen Reactions   Keflex [Cephalexin] Anaphylaxis and Rash   Hydrochlorothiazide     Joint Pain.    Crestor [Rosuvastatin]     Myalgias (intolerance)

## 2024-12-16 NOTE — ED Provider Notes (Signed)
" [place] [facility] Provider Note   CSN: [id number] Arrival date & time: 12/16/24  9082     Patient presents with: Chest Pain   Lee Stevens is a 64 y.o. male.   64 year old male history of nonobstructive CAD, hypertension, and anxiety who presents emergency department chest pain.  Started at 6:30 AM this morning when he was awake.  Describes it as a pressure-like sensation in the center of his chest.  Did have radiation to his left armpit and neck.  No diaphoresis or vomiting.  Did not appear to be exertional.  8/10 at its worst but took some aspirin  and nitroglycerin  and had some improvement on the ambulance ride over.  Currently his chest pain-free.  Denies any shortness of breath or cough.  No leg swelling recently.       Prior to Admission medications  Medication Sig Start Date End Date Taking? Authorizing Provider  acetaminophen  (TYLENOL ) 500 MG tablet Take 1,000 mg by mouth every 6 (six) hours as needed (pain.).    [provider]  ALPRAZolam  (XANAX ) 0.25 MG tablet Take 0.25 mg by mouth daily as needed for anxiety. 03/27/23   [provider]  clopidogrel  (PLAVIX ) 75 MG tablet Take 1 tablet (75 mg total) by mouth daily. 11/16/24   Monetta Redell PARAS, MD  Evolocumab  (REPATHA  SURECLICK) 140 MG/ML SOAJ Inject 140 mg into the skin every 14 (fourteen) days. 12/09/24   Monetta Redell PARAS, MD  HYDROcodone-acetaminophen  (NORCO) 10-325 MG tablet Take 1 tablet by mouth every 4 (four) hours as needed. 07/30/24   [provider]  lansoprazole (PREVACID) 15 MG capsule Take 15 mg by mouth at bedtime. 04/25/23   [provider]  levothyroxine  (SYNTHROID ) 75 MCG tablet Take 75 mcg by mouth daily before breakfast. 12/27/20   [provider]  methocarbamol  (ROBAXIN ) 500 MG tablet Take one tablet (500 mg dose) by mouth 3 (three) times a day as needed (muscle spasms). 07/30/24   [provider]  metoprolol  succinate  (TOPROL -XL) 25 MG 24 hr tablet Take 1 tablet (25 mg total) by mouth daily. 11/16/24   Monetta Redell PARAS, MD  nitroGLYCERIN  (NITROSTAT ) 0.4 MG SL tablet Place 0.4 mg under the tongue every 5 (five) minutes x 3 doses as needed for chest pain.    [provider]  olmesartan  (BENICAR ) 20 MG tablet Take 1 tablet (20 mg total) by mouth daily. 11/16/24   Monetta Redell PARAS, MD  pregabalin (LYRICA) 50 MG capsule Take 50 mg by mouth. 07/16/24   [provider]  tadalafil (CIALIS) 20 MG tablet Take 20 mg by mouth daily as needed. 07/07/24   [provider]  tadalafil (CIALIS) 5 MG tablet Take 5 mg by mouth daily.    [provider]    Allergies: Keflex [cephalexin], Hydrochlorothiazide, and Crestor [rosuvastatin]    Review of Systems  Updated Vital Signs BP 138/78   Pulse 63   Temp 98 F (36.7 C)   Resp 12   SpO2 100%   Physical Exam Vitals and nursing note reviewed.  Constitutional:      General: He is not in acute distress.    Appearance: He is well-developed.  HENT:     Head: Normocephalic and atraumatic.     Right Ear: External ear normal.     Left Ear: External ear normal.     Nose: Nose normal.  Eyes:     Extraocular Movements: Extraocular movements intact.  Conjunctiva/sclera: Conjunctivae normal.     Pupils: Pupils are equal, round, and reactive to light.  Cardiovascular:     Rate and Rhythm: Normal rate and regular rhythm.     Heart sounds: Normal heart sounds.     Comments: Radial pulses 2+ bilaterally.  Chest pain not reproducible. Pulmonary:     Effort: Pulmonary effort is normal. No respiratory distress.     Breath sounds: Normal breath sounds.  Musculoskeletal:     Cervical back: Normal range of motion and neck supple.     Right lower leg: No edema.     Left lower leg: No edema.  Skin:    General: Skin is warm and dry.  Neurological:     Mental Status: He is alert. Mental status is at baseline.  Psychiatric:        Mood and Affect:  Mood normal.        Behavior: Behavior normal.     (all labs ordered are listed, but only abnormal results are displayed) Labs Reviewed  BASIC METABOLIC PANEL WITH GFR - Abnormal; Notable for the following components:      Result Value   CO2 21 (*)    Glucose, Bld 108 (*)    Creatinine, Ser 1.48 (*)    GFR, Estimated 53 (*)    All other components within normal limits  TROPONIN T, HIGH SENSITIVITY - Abnormal; Notable for the following components:   Troponin T High Sensitivity 28 (*)    All other components within normal limits  TROPONIN T, HIGH SENSITIVITY - Abnormal; Notable for the following components:   Troponin T High Sensitivity 46 (*)    All other components within normal limits  CBC  HEPARIN  LEVEL (UNFRACTIONATED)  TROPONIN T, HIGH SENSITIVITY  TROPONIN T, HIGH SENSITIVITY    EKG: EKG Interpretation Date/Time:  Wednesday December 16 2024 14:09:34 EST Ventricular Rate:  61 PR Interval:  182 QRS Duration:  106 QT Interval:  398 QTC Calculation: 400 R Axis:   -70  Text Interpretation: Normal sinus rhythm Left anterior fascicular block Possible Lateral infarct , age undetermined Abnormal ECG When compared with ECG of 16-Dec-2024 09:44, PREVIOUS ECG IS PRESENT Confirmed by Yolande Charleston [phone]) on 12/16/2024 2:10:58 PM  Radiology: DG Chest 2 View Result Date: 12/16/2024 CLINICAL DATA:  Chest pain. EXAM: CHEST - 2 VIEW COMPARISON:  01/06/2023 FINDINGS: Heart size is stable and within normal limits. Trachea is midline. Surgical plate in the lower cervical spine. No large pleural effusions. No acute bone abnormality. IMPRESSION: No active cardiopulmonary disease. Electronically Signed   By: Juliene Balder M.D.   On: 12/16/2024 10:10     Procedures   Medications Ordered in the ED  acetaminophen  (TYLENOL ) tablet 650 mg (has no administration in time range)    Or  acetaminophen  (TYLENOL ) suppository 650 mg (has no administration in time range)  polyethylene glycol (MIRALAX  /  GLYCOLAX ) packet 17 g (has no administration in time range)  heparin  bolus via infusion 4,000 Units (has no administration in time range)  heparin  ADULT infusion 100 units/mL (25000 units/250mL) (has no administration in time range)  nitroGLYCERIN  (NITROSTAT ) SL tablet 0.4 mg (has no administration in time range)  clopidogrel  (PLAVIX ) tablet 75 mg (has no administration in time range)  pantoprazole  (PROTONIX ) EC tablet 20 mg (has no administration in time range)  levothyroxine  (SYNTHROID ) tablet 75 mcg (has no administration in time range)  metoprolol  succinate (TOPROL -XL) 24 hr tablet 25 mg (has no administration in time range)  nitroGLYCERIN  (  NITROSTAT ) SL tablet 0.4 mg (0.4 mg Sublingual Given 12/16/24 1559)    Clinical Course as of 12/16/24 1640  Wed Dec 16, 2024  1359 Dw Dr Barbette and cardiology.  Recommends having the admitting service reach back out out after admission. [RP]  1409 Dr Toma from [facility] has called for admission [RP]    Clinical Course User Index [RP] Yolande Lamar BROCKS, MD                                 Medical Decision Making Amount and/or Complexity of Data Reviewed Labs: ordered. Radiology: ordered.  Risk Prescription drug management. Decision regarding hospitalization.   EURA RADABAUGH is a 64 year old male history of nonobstructive CAD, hypertension, and anxiety who presents emergency department chest pain.    Initial Ddx:  MI, PE, pneumonia, dissection, pericarditis, costochondritis, reflux  MDM:  Patient presents emergency room with chest pain starting this morning.  Did radiate to his chest and arm.  Improved with aspirin  and nitroglycerin .  No respiratory symptoms.  No symptoms of DVT at this point in time.  There is with the patient's chest discomfort will obtain EKG and troponins to evaluate for MI.  Low concern for PE given the lack of shortness of breath or cough.  Considered dissection but with their symmetric pulses, history, and description of  the pain feel it is less likely.  If chest x-ray reveals widened mediastinum or any other concerning findings will consider CTA.  Also considered pericarditis but description is unlikely and they do not have risk factors for this diagnosis.  Chest pain not reproducible so feel it costochondritis less likely.  No infectious symptoms to suggest pneumonia at this time that would be causing pleuritic chest pain.  Plan:  Labs Troponin EKG Chest x-ray  ED Summary/Re-evaluation:  Patient reassessed and initially reported that he was chest pain-free.  Did start developing some chest pain again and so was given nitroglycerin .  Initial high-sensitivity troponin undetectably low.  Repeat was 28.  Third troponin was sent.  Discussed with [facility] for admission.  Did touch base with cardiology who will see him his medications as well do not feel strongly about starting on heparin  given very mild increase in his troponin.  This patient presents to the ED for concern of complaints listed in HPI, this involves an extensive number of treatment options, and is a complaint that carries with it a high risk of complications and morbidity. Disposition including potential need for admission considered.   Dispo: DC Home. Return precautions discussed including, but not limited to, those listed in the AVS. Allowed pt time to ask questions which were answered fully prior to dc.  Additional history obtained from spouse Records reviewed Outpatient Clinic Notes The following labs were independently interpreted: Chemistry and show CKD I independently reviewed the following imaging with scope of interpretation limited to determining acute life threatening conditions related to emergency care: Chest x-ray and agree with the radiologist interpretation with the following exceptions: none I personally reviewed and interpreted cardiac monitoring: normal sinus rhythm  I personally reviewed and interpreted the pt's EKG: see above for  interpretation  I have reviewed the patients home medications and made adjustments as needed Consults: Cardiology and internal medicine   Final diagnoses:  Chest pain, unspecified type    ED Discharge Orders     None          Yolande Lamar BROCKS, MD 12/16/24  1641 ° °"

## 2024-12-16 NOTE — H&P (Signed)
 " Date: 12/16/2024               Patient Name:  Lee Stevens MRN: [id number]  DOB: [birth year] Age / Sex: 64 y.o., male   PCP: Ofilia Lamar CROME, MD         Medical Service: Internal Medicine Teaching Service         Attending Physician: Dr. Jone Dauphin      First Contact: Dr. Alfornia Light, DO    Second Contact: Dr. Toma Edwards, DO         Pager Information: First Contact Pager: [phone]   Second Contact Pager: [phone]   SUBJECTIVE   Chief Complaint: chest pain  History of Present Illness: ELLARD NAN is a 64 y.o. male with PMH of prediabetes, hyperlipidemia intolerant to statins, hypothyroidism, hypertension, C5-C7 ACDF 07/2024, nonobstructive CAD, PFO, LAFB,OSA, GERD, CKD stage III who presented to the ED today complaining of chest pain that has been going on since 630 this morning.  Patient reports that he was sitting up in his chair at 6 AM today, then around 6:30 AM and had an hour, started having bad chest pain, so he took some Pepcid, though this did not relieve the chest pain.  Patient then took his regular morning medications, and pain continued to get worse.  The patient took 2 baby aspirin  about 30 minutes after the chest pain initially started, which also did not improve his pain.  Patient then called his daughter who is an ICU nurse, who told them to call 911 at 7:59 AM.  Patient then took 1 nitroglycerin  that was expired in 11/25 which eased his chest pain from a 8/10 to a 5/10, then just felt sore.  Patient then states the chest pain restarted again, at a 5/10 level, which lasted for 5 minutes and dissipated with no intervention.  Chest pain came back for a third time, lasting about 15 to 20 minutes, similar feeling to previous flare, though less intense.  Pain initially described as substernal, left-sided, sharp pain radiating to his left axilla and jaw, and back.  Patient says he felt dizzy and endorsed pain with deep inhalations.  Patient also endorses feeling weak in his  extremities and sick in his gut.  Patient denies any recent history of long travel, recent illness or stroke. Patient initially denying chest pain in the room, though chest pain reoccurring for the third time in the emergency department, 4th time total within 24h, pain is a 5/10, substernal radiating to his left arm and neck.  ED Course: Labs: significant for increasing troponins, <15, 28, 46 Imaging: CXR, no acute concerns, no pleural effusion and heart size WNL Received: nitroglycerin  Consulted: cardiology  Meds:  Patient reported:  Meoprolol 25 mg daily  Benicar  20 mg daily  Xaanx 0.25 mg PRN Plavix  75 mg daily Repatha  Thursday of last week, every 2 weeks Lansprozole 15 mg daily Synthroid  75 mcg Tadalafil 5mg  and 20 mg every other day   Active Medications[1]  Past Medical History prediabetes, hyperlipidemia intolerant to statins, hypothyroidism, hypertension, C5-C7 ACDF 07/2024, nonobstructive CAD, PFO, LAFB,OSA, GERD, CKD stage III  Past Surgical History Past Surgical History:  Procedure Laterality Date   ANAL FISSURE REPAIR  07/06/2016   @[facility]  BY DR CANDIE RIDING;   ENDORECTAL ADVANCEMENT FLAP 07-06-2016  AND RE-DO 10-09-2016   CARDIAC CATHETERIZATION  2008   per pt's cardiologist-- dr monetta-- note , luminal irregurilities   CHOLECYSTECTOMY, LAPAROSCOPIC  2008   COLONOSCOPY  2016  COLOSTOMY TAKEDOWN  06/02/2018   @[facility] ;   ILEOSTOMY TAKEDOWN W/ ANORECTAL EUA   EXPLORATORY LAPAROTOMY W/ BOWEL RESECTION  06/28/2017   @[facility] ;   SMALL BOWEL RESECTION W/ ILEOSTOMY REVISION   EXTRACORPOREAL SHOCK WAVE LITHOTRIPSY  2006   LAPAROSCOPIC APPENDECTOMY  2000   LAPAROSCOPIC LOOP COLOSTOMY  06/07/2017   @[facility]  BY DR S. FULLER;   LOOP ILEOSTOMY CREATION   LEFT HEART CATH AND CORONARY ANGIOGRAPHY N/A 08/09/2022   Procedure: LEFT HEART CATH AND CORONARY ANGIOGRAPHY;  Surgeon: Dann Candyce RAMAN, MD;  Location: [facility] INVASIVE CV LAB;  Service: Cardiovascular;  Laterality: N/A;   PELVIC  LYMPH NODE DISSECTION Bilateral 02/27/2024   Procedure: BILATERAL PELVIC LYMPH NODE DISSECTION;  Surgeon: Renda Glance, MD;  Location: [facility];  Service: Urology;  Laterality: Bilateral;   PLACEMENT OF SETON     @[facility] ;   08-29-2016  AND 11-05-2016   PROSTATE BIOPSY     RECTAL EXAM UNDER ANESTHESIA  03/07/2018   @[facility]    ROBOT ASSISTED LAPAROSCOPIC RADICAL PROSTATECTOMY N/A 02/27/2024   Procedure: XI ROBOTIC ASSISTED LAPAROSCOPIC RADICAL PROSTATECTOMY LEVEL 3, LYSIS OF ADHESIONS;  Surgeon: Renda Glance, MD;  Location: [facility];  Service: Urology;  Laterality: N/A;  210 MINUTES   TONSILLECTOMY AND ADENOIDECTOMY     child   TOTAL HIP ARTHROPLASTY Bilateral 2011   6 months apart same year   TRANSRECTAL ULTRASOUND N/A 11/19/2023   Procedure: TRANSRECTAL ULTRASOUND WITH PROSTATE BIOPSY;  Surgeon: Lovie Arlyss CROME, MD;  Location: [facility];  Service: Urology;  Laterality: N/A;   TRANSURETHRAL RESECTION OF BLADDER TUMOR WITH MITOMYCIN -C N/A 06/26/2023   Procedure: TRANSURETHRAL RESECTION OF BLADDER TUMOR WITH INTRAOPERATIVE INSTILLATION OF GEMCITABINE ;  Surgeon: Lovie Arlyss CROME, MD;  Location: [facility];  Service: Urology;  Laterality: N/A;  64 MINUTES NEEDED FOR CASE   TURBINATE REDUCTION  05/03/2023   @[facility]  by dr d. georgina;   BILATERAL INFERIOR   Social:  Lives With: wife and 3 dogs Occupation: Comptroller Support: Wife, daughter Level of Function: Independent ADLs PCP:  Ofilia Lamar CROME, MD  Substances: -Tobacco: denies -Alcohol: denies -Recreational Drug: denies  Family History:  Family History  Problem Relation Age of Onset   Hypertension Mother    Heart attack Father    Hyperlipidemia Father     Allergies: Allergies as of 12/16/2024 - Review Complete 12/16/2024  Allergen Reaction Noted   Keflex [cephalexin] Anaphylaxis and Rash 11/29/2015   Hydrochlorothiazide  01/20/2016   Crestor [rosuvastatin]  10/22/2019   Review of Systems: A  complete ROS was negative except as per HPI.   OBJECTIVE:   Physical Exam: Blood pressure 124/67, pulse 63, temperature 98 F (36.7 C), resp. rate 12, SpO2 100%.  Constitutional: well-appearing male laying down in bed, in no acute distress HENT: normocephalic atraumatic, mucous membranes moist Eyes: conjunctiva non-erythematous Neck: supple Cardiovascular: Bradycardic, regular rhythm, no m/r/g Pulmonary/Chest: normal work of breathing on room air, lungs clear to auscultation bilaterally, non-TTP Abdominal: soft, non-tender, non-distended, non-TTP MSK: normal bulk and tone Neurological: alert & oriented x 3, 5/5 strength in bilateral upper and lower extremities, normal gait Skin: warm and dry, nonedematous bilateral LE Psych: Euthymic mood  Labs: CBC    Component Value Date/Time   WBC 5.1 12/16/2024 0942   RBC 5.01 12/16/2024 0942   HGB 15.5 12/16/2024 0942   HGB 15.7 08/07/2022 1542   HCT 43.6 12/16/2024 0942   HCT 45.2 08/07/2022 1542   PLT 229 12/16/2024 0942   PLT  244 08/07/2022 1542   MCV 87.0 12/16/2024 0942   MCV 88 08/07/2022 1542   MCH 30.9 12/16/2024 0942   MCHC 35.6 12/16/2024 0942   RDW 12.3 12/16/2024 0942   RDW 14.0 08/07/2022 1542     CMP     Component Value Date/Time   NA 137 12/16/2024 0942   NA 138 08/07/2022 1542   K 4.3 12/16/2024 0942   CL 102 12/16/2024 0942   CO2 21 (L) 12/16/2024 0942   GLUCOSE 108 (H) 12/16/2024 0942   BUN 13 12/16/2024 0942   BUN 18 08/07/2022 1542   CREATININE 1.48 (H) 12/16/2024 0942   CALCIUM  10.0 12/16/2024 0942   GFRNONAA 53 (L) 12/16/2024 0942   GFRAA 55 (L) 11/05/2018 1210    Imaging: DG Chest 2 View Result Date: 12/16/2024 CLINICAL DATA:  Chest pain. EXAM: CHEST - 2 VIEW COMPARISON:  01/06/2023 FINDINGS: Heart size is stable and within normal limits. Trachea is midline. Surgical plate in the lower cervical spine. No large pleural effusions. No acute bone abnormality. IMPRESSION: No active cardiopulmonary  disease. Electronically Signed   By: Juliene Balder M.D.   On: 12/16/2024 10:10    EKG: personally reviewed my interpretation is left anterior fascicular block, unchanged from prior EKGs.   ASSESSMENT & PLAN:   Assessment & Plan by Problem: Active Problems:   * No active hospital problems. *   NASRI BOAKYE is a 64 y.o. person living with a history of prediabetes, hyperlipidemia intolerant to statins, hypothyroidism, hypertension, C5-C7 ACDF 07/2024, nonobstructive CAD, PFO, LAFB, OSA, GERD, CKD stage III, prostate and bladder cancer who presented with chest pain and admitted for unstable angina on hospital day 0  # Unstable angina  NSTEMI # History of nonobstructive CAD Patient presenting with unprovoked substernal left sided chest pain since 0630 this morning, that is unrelieved by aspirin , and only minimally relieved with nitroglycerin .  Chest pain comes and goes at rest, continuing to persist in the ED, recurring 3 times since arrival.  Troponins have been increasing, initially <15, 28, then 46.  Prior cardiac history of 08/09/2022 left heart cath mid LAD 40% stenosed, recommendations for medical therapy, 11/14/2018 coronary CTA calcium  score 514, 96 percentile, PFO, FFR without any hemodynamic significance. TIMI score 4 points, increased risk of adverse outcome and HEART score of 7 points, indicating 50 to 65% risk of adverse cardiac event.  Patient afebrile with EKG unchanged from previous showing left anterior fascicular block, not acute ST changes and not concerned for infectious etiology like pericarditis at this time. Given this patient's chest pain continues to reoccur at rest with increase trend in troponins, concern for ischemic event, placed patient on heparin  drip and consulted cardiology, appreciate recs. - Heparin  IV bolus 4000 units - Start heparin  GTT 14 mL/h - Continue to trend troponins - repeat EKG in a.m. - Pending echo - daily BMP, CBC, heparin  level, Mag - Continue home  Plavix  25 mg daily - Continue home metoprolol  25 mg daily - Continue home nitroglycerin  as needed  # Hyperlipidemia Lipid panel from 1 month ago WNL, LDL 22, cholesterol 94, HDL 43, except for TGs, 218. LPA on 09/03/2022 was 8.4. Stable, no acute concern at this time. - continue outpatient Repatha  injections  # HTN Patient notes good compliance with his blood pressure medications, typically in the 120s/80s at home, though notes pulse rate typically in the 50s to 60s, denies lightheadedness, dizziness, vision changes. Bps soft on assessment in ED, will hold Benicar  and reassess  tomorrow. - Continue home metoprolol  25 mg daily - Hold home Benicar  20 mg daily given soft Bps while here  #GERD -continue lansoprazole 15 mg daily/pantoprazole  20 mg daily # Hypothyroidism -continue Synthroid  75 mcg daily # hx of prostate CA - hold home Tadalafil 5mg  and 20 mg every other day, given pt receiving NG # s/p ACDF -hold home Lyrica, robaxin , norco; patient not taking & not complaining of MSK/neuropathic pain # anxiety -hold home Xanax , patient states he has not taken this in the last 6 months  Best practice: Diet: Heart Healthy VTE: Heparin  IVF: None,None Code: Full  Disposition planning: Prior to Admission Living Arrangement: Home, living with wife and 3 dogs Anticipated Discharge Location: Home  Dispo: Admit patient to Inpatient with expected length of stay greater than 2 midnights.  Signed: Idelle Nakai, DO Internal Medicine Resident  12/16/2024, 4:01 PM  On Call pager: [phone]      [1]  No outpatient medications have been marked as taking for the 12/16/24 encounter [facility] Encounter).   "

## 2024-12-16 NOTE — ED Provider Triage Note (Addendum)
 Emergency Medicine Provider Triage Evaluation Note  Lee Stevens , a 64 y.o. male nonobstructive CAD was evaluated in triage.  Pt complains of chest pain. Took asa and nitro pta and pain is now 2/10. Thinks it may be related to his anxiety.  Review of Systems  Positive:  Negative:   Physical Exam  BP 125/75 (BP Location: Left Arm)   Pulse 61   Temp 97.7 F (36.5 C) (Oral)   Resp 20   SpO2 100%  Gen:   Awake, no distress   Resp:  Normal effort  MSK:   Moves extremities without difficulty  Other:    Medical Decision Making  Medically screening exam initiated at 9:58 AM.  Appropriate orders placed.  OTTIS VACHA was informed that the remainder of the evaluation will be completed by another provider, this initial triage assessment does not replace that evaluation, and the importance of remaining in the ED until their evaluation is complete.   Yolande Lamar BROCKS, MD 12/16/24 1000    Yolande Lamar BROCKS, MD 12/16/24 1000

## 2024-12-16 NOTE — Hospital Course (Addendum)
#   NSTEMI # History of nonobstructive CAD Patient presented with unprovoked substernal left sided chest pain, unrelieved by aspirin , and only minimally relieved with nitroglycerin .  Chest pain comes and goes at rest, continuing to persist in the ED, recurring 3 times since arrival.  Troponins have been increasing, initially <15, 28, 46 then 78. TIMI score 4 points, demonstrating increased risk of adverse outcome and HEART score of 7 points, indicating 50 to 65% risk of adverse cardiac event.  Patient afebrile with EKG unchanged from previous showing left anterior fascicular block, not acute ST changes. Placed patient on heparin  drip given unstable chest pain and consulted cardiology. Cards recommended PCI, and patient made NPO in anticipation of procedure. LHC showed moderate diffuse CAD of LAD (mid LAD 40% stenosed and distal LAD 60% stenosed), RCA superdominant, LVEP mildly elevated at 15 mmHg. Echo resulted without regional wall motion abnormalities and EF of 55-60%, cards recommending DAPT for 1 year, added daily 81mg  ASA in addition to Plavix . Patient also given prescription for nitroglycerine for CP as needed, and instructed to return to the ED if CP unrelieved with this.   # Hyperlipidemia Lipid panel from 1 month ago WNL, LDL 22, cholesterol 94, HDL 43, except for TGs, 218. LPA on 09/03/2022 was 8.4. Stable, no acute concern at this time, continue outpatient Repatha  injections.   # HTN Patient notes good compliance with his blood pressure medications, typically in the 120s/80s at home, though notes pulse rate typically in the 50s to 60s, denied lightheadedness, dizziness, vision changes. Bps soft on assessment in ED, so held Benicar  and waiting fro reassessment. Bps elevated on assessment, so pharmacy formulary alternative to Benicar  was started with improvement in BP, and patient resumed on home medications on discharge with the addition of daily 81mg  ASA.

## 2024-12-16 NOTE — Consult Note (Addendum)
 "  Cardiology Consultation   Patient ID: Lee Stevens MRN: [id number]; DOB: [birth year]  Admit date: 12/16/2024 Date of Consult: 12/16/2024  PCP:  Lee Lamar CROME, MD   [facility] Providers Cardiologist:  Lee Leiter, MD        Patient Profile: Lee Stevens is a 64 y.o. male with a hx of nonobstructive CAD, HLD intolerant to statins, HTN, PFO, CKD stage III, s/p C5-C7 ACDF [07/2024], GERD, OSA, anxiety who is being seen 12/16/2024 for the evaluation of chest pain at the request of Dr. Jone Stevens.  History of Present Illness: [name] has medical history as stated above. Of note, patient has longstanding history of intermittent chest pain dating back several years. Coronary CT angiogram with FFR revealed moderate diffuse CAD without hemodynamically significant stenosis [11/14/18]. LHC in 07/2022 showed mid LAD lesion with 40%. Has followed outpatient cardiology with Dr. Leiter, stable on medical management but has continued to have ongoing intermittent chest pain.  He was BIB EMS from home complaining of retrosternal pleuritic chest pain with radiation to the left armpit and neck/upper back that started at 6:30 AM this morning. He took Pepcid thinking it was indigestion but did not have any relief. He then tried sitting down and resting for 15 minutes but still did not have any pain relief. He then took a dose of Xanax , for which he was prescribed for anxiety/panic attacks, and NTG. He called his daughter who is an ICU nurse who advised him to take ASA 81 mg x 2 doses and call 911. EMS arrived shortly after their call at 8 AM. He was given another NTG and ASA 81 x 2 more doses (total 324 mg) en route to the ED, after which he states that his chest pain went from 8/10 in severity to 5/10. Reports nausea but denies any SOB, palpitations, diaphoresis associated with his chest pain. Patient was hypertensive at 170/108 on arrival, vitals otherwise unremarkable. He states that his chest pain  resolved after he was sent back to the ED waiting room following triage assessment (had lasted for about 3-4 hours at that point). The pain did come back a couple more times while waiting in the ED, with 4 total episodes today.  Relevant ED workup: ED EKG showed sinus rhythm with LAFB, rate 65 bpm. Troponins initially undetectably low, then 28 >> 46 >> 78. Creatinine at 1.48 (baseline 1.49 per annual physical lab work in 11/2024). Otherwise, CBC and BMP unremarkable. No active cardiopulmonary disease on CXR. Echo pending.  Upon speaking to the patient today, he states that he has had several years-long history of sporadically chest pain, up to 2-3 episodes in a 6 month timeframe. However, he states that this chest pain today was different from his usual chest pain and concerned him enough to take NTG for the first time and call 911. Does not take any long-acting nitrate at home. Reports cardiac family history of father with first MI in his 19s and fatal MI at age 70.   Past Medical History:  Diagnosis Date   Allergic rhinitis    Anticoagulant long-term use    plavix ---   managed by cardiology   Anxiety disorder 2018   Arthritis    neck   Atypical chest pain 09/25/2018   followed by cardiology-- dr Stevens;  continue's to monitor for long history per dr Stevens note pt had cardiac cath in 2008 w/ luminal irregularity;  normal stress echo 04/ 2017;   Benign hypertension 01/19/2016  Bladder tumor 05/2023   BPH associated with nocturia    CAD in native artery 12/01/2018   cardiologist--- dr monetta;   coronary CT FFR/ morph 11-14-2018  calcium  score=514 involving RCA/ LAD/ PDA/ PL;   cardiac cath 08-09-2022  nonob CAD mild LAD 40%   Cancer (HCC)    bladder   Chronic kidney disease, stage 3 (HCC) 04/19/2017   folllowed by pcp   ETD (Eustachian tube dysfunction), right    GERD (gastroesophageal reflux disease)    History of anal fissures 04/10/2016   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 georgina (note in care everywhere )---  06/ 2024 parotid right greater than left ,  resolved without intervention   Prostate nodule 04/25/2023   Right lower quadrant abdominal pain 05/09/2023   Subclinical hypothyroidism    Tinnitus of both ears 11/26/2018   Wears glasses     Past Surgical History:  Procedure Laterality Date   ANAL FISSURE REPAIR  07/06/2016   @[facility]  BY DR CANDIE RIDING;   ENDORECTAL ADVANCEMENT FLAP 07-06-2016  AND RE-DO 10-09-2016   CARDIAC CATHETERIZATION  2008   per pt's cardiologist-- dr monetta-- note , luminal irregurilities   CHOLECYSTECTOMY, LAPAROSCOPIC  2008   COLONOSCOPY  2016   COLOSTOMY TAKEDOWN  06/02/2018   @[facility] ;   ILEOSTOMY TAKEDOWN W/ ANORECTAL EUA   EXPLORATORY LAPAROTOMY W/ BOWEL RESECTION  06/28/2017   @[facility] ;   SMALL BOWEL RESECTION W/ ILEOSTOMY REVISION   EXTRACORPOREAL SHOCK WAVE LITHOTRIPSY  2006   LAPAROSCOPIC APPENDECTOMY  2000   LAPAROSCOPIC LOOP COLOSTOMY  06/07/2017   @[facility]  BY DR S. FULLER;   LOOP ILEOSTOMY CREATION   LEFT HEART CATH AND CORONARY ANGIOGRAPHY N/A 08/09/2022   Procedure: LEFT HEART CATH AND CORONARY ANGIOGRAPHY;  Surgeon: Lee Candyce RAMAN, MD;  Location: [facility];  Service: Cardiovascular;  Laterality: N/A;   PELVIC LYMPH NODE DISSECTION Bilateral 02/27/2024   Procedure: BILATERAL PELVIC LYMPH NODE DISSECTION;  Surgeon: Renda Glance, MD;  Location: [facility];  Service: Urology;  Laterality: Bilateral;   PLACEMENT OF SETON     @[facility] ;   08-29-2016  AND 11-05-2016   PROSTATE BIOPSY     RECTAL EXAM UNDER  ANESTHESIA  03/07/2018   @[facility]    ROBOT ASSISTED LAPAROSCOPIC RADICAL PROSTATECTOMY N/A 02/27/2024   Procedure: XI ROBOTIC ASSISTED LAPAROSCOPIC RADICAL PROSTATECTOMY LEVEL 3, LYSIS OF ADHESIONS;  Surgeon: Renda Glance, MD;  Location: [facility];  Service: Urology;  Laterality: N/A;  210 MINUTES   TONSILLECTOMY AND ADENOIDECTOMY     child   TOTAL HIP ARTHROPLASTY Bilateral 2011   6 months apart same year   TRANSRECTAL ULTRASOUND N/A 11/19/2023   Procedure: TRANSRECTAL ULTRASOUND WITH PROSTATE BIOPSY;  Surgeon: Lovie Arlyss CROME, MD;  Location: [facility];  Service: Urology;  Laterality: N/A;   TRANSURETHRAL RESECTION OF BLADDER TUMOR WITH MITOMYCIN -C N/A 06/26/2023   Procedure: TRANSURETHRAL RESECTION OF BLADDER TUMOR WITH INTRAOPERATIVE INSTILLATION OF GEMCITABINE ;  Surgeon: Lovie Arlyss CROME, MD;  Location: [facility];  Service: Urology;  Laterality: N/A;  74 MINUTES NEEDED FOR CASE   TURBINATE REDUCTION  05/03/2023   @[facility]  by dr d. georgina;   BILATERAL INFERIOR       Scheduled Meds:  clopidogrel   75 mg Oral Daily   [START ON 12/17/2024] irbesartan   150 mg Oral Daily   [START ON 12/17/2024] levothyroxine   75 mcg Oral QAC breakfast   [START ON 12/17/2024] metoprolol  succinate  25 mg Oral Daily   nitroGLYCERIN   0.5 inch Topical Q6H   pantoprazole   20 mg Oral Daily   Continuous Infusions:  heparin  1,400 Units/hr (12/16/24 1654)   PRN Meds: acetaminophen  **OR** acetaminophen , nitroGLYCERIN , polyethylene glycol  Allergies:   Allergies[1]  Social History:   Social History   Socioeconomic History   Marital status: Married    Spouse name: Not on file   Number of children: Not on file   Years of education: Not on file   Highest education level: Not on file  Occupational History   Not on file  Tobacco Use   Smoking status: Never   Smokeless tobacco: Former    Types: Snuff    Quit date: 2004  Vaping Use   Vaping status: Never Used  Substance and Sexual  Activity   Alcohol use: Not Currently   Drug use: Never   Sexual activity: Not on file  Other Topics Concern   Not on file  Social History Narrative   Not on file   Social Drivers of Health   Tobacco Use: Medium Risk (12/16/2024)   Patient History    Smoking Tobacco Use: Never    Smokeless Tobacco Use: Former    Passive Exposure: Not on Actuary Strain: Low Risk (10/12/2024)   Received from [facility]   Overall Financial Resource Strain (CARDIA)    How hard is it for you to pay for the very basics like food, housing, medical care, and heating?: Not hard at all  Food Insecurity: Low Risk (11/12/2024)   Received from [facility]   Epic    Within the past 12 months, you worried that your food would run out before you got money to buy more: Never true    Within the past 12 months, the food you bought just didn't last and you didn't have money to get more. : Never true  Transportation Needs: No Transportation Needs (11/12/2024)   Received from [facility]    In the past 12 months, has lack of reliable transportation kept you from medical appointments, meetings, work or from getting things needed for daily living? : No  Physical Activity: Insufficiently Active (10/12/2024)   Received from [facility]   Exercise Vital Sign    On average, how many days per week do you engage in moderate to strenuous exercise (like a brisk walk)?: 1 day    On average, how many minutes do you engage in exercise at this level?: 30 min  Stress: No Stress Concern Present (10/12/2024)   Received from [facility] - Occupational Stress Questionnaire    Do you feel stress - tense, restless, nervous, or anxious, or unable to sleep at night because your mind is troubled all the time - these days?: Only a little  Social Connections: Socially Integrated (10/12/2024)   Received from [facility]   Social Network    How would you rate  your social network (family, work, friends)?: Good participation with social networks  Intimate  Partner Violence: Not At Risk (10/12/2024)   Received from [facility]   HITS    Over the last 12 months how often did your partner physically hurt you?: Never    Over the last 12 months how often did your partner insult you or talk down to you?: Never    Over the last 12 months how often did your partner threaten you with physical harm?: Never    Over the last 12 months how often did your partner scream or curse at you?: Never  Depression (PHQ2-9): Low Risk (12/17/2023)   Depression (PHQ2-9)    PHQ-2 Score: 0  Alcohol Screen: Low Risk (12/17/2023)   Alcohol Screen    Last Alcohol Screening Score (AUDIT): 0  Housing: Low Risk (11/12/2024)   Received from [facility]   Epic    What is your living situation today?: I have a steady place to live    Think about the place you live. Do you have problems with any of the following? Choose all that apply:: None/None on this list  Utilities: Low Risk (11/12/2024)   Received from [facility]   Utilities    In the past 12 months has the electric, gas, oil, or water  company threatened to shut off services in your home? : No  Health Literacy: Not on file    Family History:    Family History  Problem Relation Age of Onset   Hypertension Mother    Heart attack Father    Hyperlipidemia Father      ROS:  Please see the history of present illness.   All other ROS reviewed and negative.     Physical Exam/Data: Vitals:   12/16/24 1600 12/16/24 1630 12/16/24 1837 12/16/24 1900  BP: 138/78 106/68 (!) 140/86 139/76  Pulse:  61 (!) 58 (!) 58  Resp: 12 13 14 10   Temp:  98.2 F (36.8 C)    TempSrc:  Oral    SpO2: 100% 92% 100% 98%   No intake or output data in the 24 hours ending 12/16/24 1940    08/12/2024    3:37 PM 02/27/2024    8:55 AM 02/21/2024    8:56 AM  Last 3 Weights  Weight (lbs) 214 lb 211 lb 211 lb  Weight (kg) 97.07 kg 95.709 kg  95.709 kg     There is no height or weight on file to calculate BMI.  General: Well nourished, well developed, in no acute distress Vascular: No carotid bruits; Distal pulses 2+ bilaterally Cardiac: Normal S1, S2; RRR; no murmur  Lungs: Clear to auscultation bilaterally, no wheezing, rhonchi or rales  Abd: Soft, nontender Ext: No peripheral edema Musculoskeletal: No deformities Skin: Warm and dry  Neuro: No focal abnormalities noted Psych: Normal affect   EKG: The ED EKG was personally reviewed and demonstrates: Sinus rhythm with LAFB, rate 65 bpm, unchanged from prior EKG  Relevant CV Studies:  LHC [08/09/22]: Mid LAD lesion is 40% stenosed. LV end diastolic pressure is mildly elevated.  LVEDP 20 mm Hg. There is no aortic valve stenosis. IMPRESSION: Mild, nonobstructive coronary artery disease.  Very large, dominant right coronary artery which feeds the apex.  LAD and its branches are generally small and diffusely diseased.  Continue medical therapy.  We spoke about the importance of healthy diet, regular exercise.   Laboratory Data: High Sensitivity Troponin:  No results for input(s): TROPONINIHS in the last 720 hours.  Recent Labs  Lab 12/16/24 [phone] 12/16/24 1221 12/16/24 1422 12/16/24 1645  TRNPT <15 28* 46* 78*      Chemistry Recent Labs  Lab 12/16/24 0942  NA 137  K 4.3  CL 102  CO2 21*  GLUCOSE 108*  BUN 13  CREATININE 1.48*  CALCIUM  10.0  GFRNONAA 53*  ANIONGAP 14    No results for input(s): PROT, ALBUMIN, AST, ALT, ALKPHOS, BILITOT in the last 168 hours. Lipids No results for input(s): CHOL, TRIG, HDL, LABVLDL, LDLCALC, CHOLHDL in the last 168 hours.  Hematology Recent Labs  Lab 12/16/24 0942  WBC 5.1  RBC 5.01  HGB 15.5  HCT 43.6  MCV 87.0  MCH 30.9  MCHC 35.6  RDW 12.3  PLT 229   Thyroid No results for input(s): TSH, FREET4 in the last 168 hours.  BNPNo results for input(s): BNP, PROBNP in the last 168  hours.  DDimer No results for input(s): DDIMER in the last 168 hours.  Radiology/Studies:  DG Chest 2 View Result Date: 12/16/2024 CLINICAL DATA:  Chest pain. EXAM: CHEST - 2 VIEW COMPARISON:  01/06/2023 FINDINGS: Heart size is stable and within normal limits. Trachea is midline. Surgical plate in the lower cervical spine. No large pleural effusions. No acute bone abnormality. IMPRESSION: No active cardiopulmonary disease. Electronically Signed   By: Juliene Balder M.D.   On: 12/16/2024 10:10    Assessment and Plan: NSTEMI Nonobstructive CAD Presented with 4 total episodes of intermittent retrosternal chest pain radiating to the left arm and neck/upper back ED EKG showed sinus rhythm with LAFB, rate 65 bpm, unchanged from prior EKG Troponins initially undetectably low, then 28 >> 46 >> 78 No active cardiopulmonary disease on CXR Echo pending, no prior echo for review/comparison LHC [08/09/22]: Mid LAD lesion with 40% stenosis, recommended medical therapy  Given pt with longstanding history of intermittent chest pain presenting with new chest pain that is different in severity and character, suspect unstable angina and plan for cardiac catheterization Continue heparin  drip Continue home Plavix  75 mg daily, metoprolol  succinate 25 mg daily No ASA due to tinnitus Apply topical NTG as needed for pain  Hypertension BP stable, continue home antihypertensives  Hyperlipidemia Known intolerance to statins Home meds: Repatha   Per primary CKD stage III GERD OSA   Risk Assessment/Risk Scores:    TIMI Risk Score for Unstable Angina or Non-ST Elevation MI:   The patient's TIMI risk score is 4, which indicates a 20% risk of all cause mortality, new or recurrent myocardial infarction or need for urgent revascularization in the next 14 days.    For questions or updates, please contact [facility] Please consult [email or web address] for contact info under      Signed, Owen MARLA Daniels, PA-C   12/16/2024 7:40 PM  I have personally seen and examined this patient. I agree with the assessment and plan as outlined above.  64 yo male with h/o HTN, HLD, OSA, CKD and CAD with chest pain. His pain has occurred mostly at rest, on and off for the past day. Troponin is mildly elevated. EKG with sinus, no ischemic changes.  Labs reviewed by me Tele: sinus  My exam:  General: Well developed, well nourished, NAD  SKIN: warm, dry. Neuro: No focal deficits  Psychiatric: Mood and affect normal  Neck: No JVD Lungs:Clear bilaterally, no wheezes, rhonci, crackles Cardiovascular: Regular rate and rhythm. No murmurs, gallops or rubs. Abdomen:Soft.  Extremities: No lower extremity edema.    Plan: NSTEMI: He is known to have mild CAD by cath in 2023. His current presentation is  concerning for ACS.  Cardiac cath is indicated.   I have reviewed the risks, indications, and alternatives to cardiac catheterization, possible angioplasty, and stenting with the patient. Risks include but are not limited to bleeding, infection, vascular injury, stroke, myocardial infection, arrhythmia, kidney injury, radiation-related injury in the case of prolonged fluoroscopy use, emergency cardiac surgery, and death. The patient understands the risks of serious complication is 1-2 in 1000 with diagnostic cardiac cath and 1-2% or less with angioplasty/stenting. Continue IV heparin  Continue ASA. He is statin intolerant Cardiac cath with possible PCI tomorrow.   Lonni Cash, MD, [facility] 12/16/2024 8:10 PM      [1]  Allergies Allergen Reactions   Keflex [Cephalexin] Anaphylaxis and Rash   Hydrochlorothiazide     Joint Pain.    Aspirin      tinnitus   Crestor [Rosuvastatin]     Myalgias (intolerance)     "

## 2024-12-16 NOTE — ED Notes (Signed)
 Contacted by MD via secure chat to give one time dose SL nitroglycerin , patient now reporting chest discomfort.

## 2024-12-17 ENCOUNTER — Other Ambulatory Visit (HOSPITAL_COMMUNITY): Payer: Self-pay

## 2024-12-17 ENCOUNTER — Encounter (HOSPITAL_COMMUNITY): Admission: EM | Disposition: A | Discharge status: Home / Self Care | Payer: Self-pay | Source: Home / Self Care

## 2024-12-17 ENCOUNTER — Inpatient Hospital Stay (HOSPITAL_COMMUNITY)

## 2024-12-17 DIAGNOSIS — I214 Non-ST elevation (NSTEMI) myocardial infarction: Principal | ICD-10-CM

## 2024-12-17 DIAGNOSIS — R079 Chest pain, unspecified: Secondary | ICD-10-CM

## 2024-12-17 DIAGNOSIS — G4733 Obstructive sleep apnea (adult) (pediatric): Secondary | ICD-10-CM

## 2024-12-17 DIAGNOSIS — N1831 Chronic kidney disease, stage 3a: Secondary | ICD-10-CM

## 2024-12-17 DIAGNOSIS — I25110 Atherosclerotic heart disease of native coronary artery with unstable angina pectoris: Secondary | ICD-10-CM

## 2024-12-17 HISTORY — PX: LEFT HEART CATH AND CORONARY ANGIOGRAPHY: CATH118249

## 2024-12-17 LAB — CBC
HCT: 41.4 % (ref 39.0–52.0)
HCT: 41.2 % (ref 39.0–52.0)
Hemoglobin: 14.7 g/dL (ref 13.0–17.0)
Hemoglobin: 14.6 g/dL (ref 13.0–17.0)
MCH: 30.6 pg (ref 26.0–34.0)
MCH: 30.5 pg (ref 26.0–34.0)
MCHC: 35.5 g/dL (ref 30.0–36.0)
MCHC: 35.4 g/dL (ref 30.0–36.0)
MCV: 86.3 fL (ref 80.0–100.0)
MCV: 86 fL (ref 80.0–100.0)
Platelets: 230 K/uL (ref 150–400)
Platelets: 225 K/uL (ref 150–400)
RBC: 4.8 MIL/uL (ref 4.22–5.81)
RBC: 4.79 MIL/uL (ref 4.22–5.81)
RDW: 12.5 % (ref 11.5–15.5)
RDW: 12.2 % (ref 11.5–15.5)
WBC: 6.6 K/uL (ref 4.0–10.5)
WBC: 5.4 K/uL (ref 4.0–10.5)
nRBC: 0 % (ref 0.0–0.2)
nRBC: 0 % (ref 0.0–0.2)

## 2024-12-17 LAB — BASIC METABOLIC PANEL WITH GFR
Anion gap: 12 (ref 5–15)
BUN: 17 mg/dL (ref 8–23)
CO2: 23 mmol/L (ref 22–32)
Calcium: 9.4 mg/dL (ref 8.9–10.3)
Chloride: 104 mmol/L (ref 98–111)
Creatinine, Ser: 1.43 mg/dL — ABNORMAL HIGH (ref 0.61–1.24)
GFR, Estimated: 56 mL/min — ABNORMAL LOW
Glucose, Bld: 106 mg/dL — ABNORMAL HIGH (ref 70–99)
Potassium: 4.4 mmol/L (ref 3.5–5.1)
Sodium: 139 mmol/L (ref 135–145)

## 2024-12-17 LAB — CREATININE, SERUM
Creatinine, Ser: 1.34 mg/dL — ABNORMAL HIGH (ref 0.61–1.24)
GFR, Estimated: >60 mL/min

## 2024-12-17 LAB — ECHOCARDIOGRAM COMPLETE
Area-P 1/2: 2.99 cm2
Height: 75 in
S' Lateral: 3.7 cm
Weight: 3520 [oz_av]

## 2024-12-17 LAB — MAGNESIUM: Magnesium: 1.9 mg/dL (ref 1.7–2.4)

## 2024-12-17 LAB — HEPARIN LEVEL (UNFRACTIONATED): Heparin Unfractionated: 0.42 [IU]/mL (ref 0.30–0.70)

## 2024-12-17 LAB — HIV ANTIBODY (ROUTINE TESTING W REFLEX): HIV Screen 4th Generation wRfx: NONREACTIVE

## 2024-12-17 MED ORDER — SODIUM CHLORIDE 0.9 % WEIGHT BASED INFUSION: 3.0000 mL/kg/h | INTRAVENOUS | Status: DC

## 2024-12-17 MED ORDER — FENTANYL CITRATE (PF) 100 MCG/2ML IJ SOLN
INTRAMUSCULAR | Status: DC | PRN
  Administered 2024-12-17: 50 ug via INTRAVENOUS

## 2024-12-17 MED ORDER — SODIUM CHLORIDE 0.9 % IV SOLN: INTRAVENOUS | Status: DC

## 2024-12-17 MED ORDER — SODIUM CHLORIDE 0.9 % IV SOLN: INTRAVENOUS | Status: AC

## 2024-12-17 MED ORDER — VERAPAMIL HCL 2.5 MG/ML IV SOLN
INTRAVENOUS | Status: AC
  Filled 2024-12-17: qty 2

## 2024-12-17 MED ORDER — ENOXAPARIN SODIUM 40 MG/0.4ML IJ SOSY
40.0000 mg | PREFILLED_SYRINGE | INTRAMUSCULAR | Status: DC
  Filled 2024-12-17: qty 0.4

## 2024-12-17 MED ORDER — SODIUM CHLORIDE 0.9 % WEIGHT BASED INFUSION: 1.0000 mL/kg/h | INTRAVENOUS | Status: DC

## 2024-12-17 MED ORDER — LIDOCAINE HCL (PF) 1 % IJ SOLN
INTRAMUSCULAR | Status: AC
  Filled 2024-12-17: qty 30

## 2024-12-17 MED ORDER — ASPIRIN 81 MG PO CHEW
81.0000 mg | CHEWABLE_TABLET | Freq: Once | ORAL | Status: AC
  Administered 2024-12-17: 81 mg via ORAL
  Filled 2024-12-17: qty 1

## 2024-12-17 MED ORDER — FENTANYL CITRATE (PF) 100 MCG/2ML IJ SOLN
INTRAMUSCULAR | Status: AC
  Filled 2024-12-17: qty 2

## 2024-12-17 MED ORDER — SODIUM CHLORIDE 0.9% FLUSH
3.0000 mL | Freq: Two times a day (BID) | INTRAVENOUS | Status: DC
  Administered 2024-12-17 – 2024-12-18 (×2): 3 mL via INTRAVENOUS

## 2024-12-17 MED ORDER — ASPIRIN 81 MG PO CHEW
81.0000 mg | CHEWABLE_TABLET | Freq: Every day | ORAL | Status: DC
  Administered 2024-12-18: 81 mg via ORAL
  Filled 2024-12-17: qty 1

## 2024-12-17 MED ORDER — ONDANSETRON HCL 4 MG/2ML IJ SOLN: 4.0000 mg | Freq: Four times a day (QID) | INTRAMUSCULAR | Status: DC | PRN

## 2024-12-17 MED ORDER — IOHEXOL 350 MG/ML SOLN
INTRAVENOUS | Status: DC | PRN
  Administered 2024-12-17: 45 mL

## 2024-12-17 MED ORDER — SODIUM CHLORIDE 0.9 % IV SOLN: 250.0000 mL | INTRAVENOUS | Status: DC | PRN

## 2024-12-17 MED ORDER — PERFLUTREN LIPID MICROSPHERE
1.0000 mL | INTRAVENOUS | Status: AC | PRN
  Administered 2024-12-17: 3 mL via INTRAVENOUS

## 2024-12-17 MED ORDER — HEPARIN (PORCINE) IN NACL 1000-0.9 UT/500ML-% IV SOLN
INTRAVENOUS | Status: DC | PRN
  Administered 2024-12-17 (×2): 500 mL

## 2024-12-17 MED ORDER — SODIUM CHLORIDE 0.9% FLUSH: 3.0000 mL | INTRAVENOUS | Status: DC | PRN

## 2024-12-17 MED ORDER — MIDAZOLAM HCL 2 MG/2ML IJ SOLN
INTRAMUSCULAR | Status: AC
  Filled 2024-12-17: qty 2

## 2024-12-17 MED ORDER — LIDOCAINE HCL (PF) 1 % IJ SOLN
INTRAMUSCULAR | Status: DC | PRN
  Administered 2024-12-17 (×2): 2 mL

## 2024-12-17 MED ORDER — HEPARIN SODIUM (PORCINE) 1000 UNIT/ML IJ SOLN
INTRAMUSCULAR | Status: AC
  Filled 2024-12-17: qty 10

## 2024-12-17 MED ORDER — VERAPAMIL HCL 2.5 MG/ML IV SOLN
INTRAVENOUS | Status: DC | PRN
  Administered 2024-12-17: 10 mL via INTRA_ARTERIAL

## 2024-12-17 MED ORDER — MIDAZOLAM HCL (PF) 2 MG/2ML IJ SOLN
INTRAMUSCULAR | Status: DC | PRN
  Administered 2024-12-17 (×2): 1 mg via INTRAVENOUS

## 2024-12-17 MED ORDER — HEPARIN SODIUM (PORCINE) 1000 UNIT/ML IJ SOLN
INTRAMUSCULAR | Status: DC | PRN
  Administered 2024-12-17: 5000 [IU] via INTRAVENOUS

## 2024-12-17 NOTE — ED Notes (Signed)
 Pt transported to cath lab.

## 2024-12-17 NOTE — Progress Notes (Signed)
 "  HD#1 SUBJECTIVE:  Patient Summary: Lee Stevens is a 64 y.o. with a pertinent PMH of prediabetes, hyperlipidemia intolerant to statins, hypothyroidism, hypertension, C5-C7 ACDF 07/2024, nonobstructive CAD, PFO, LAFB, OSA, GERD, CKD stage III, prostate and bladder cancer who presented with chest pain and admitted for unstable angina.  Overnight Events: none  Interim History: Patient reported one more incidence of CP occurring overnight that came and went quickly, and he described this as a sharp stab. Patient is concerned and worried about the procedure today, though denies any somatic complaints at this time. Patient was reassured, denies any other questions or concerns.  OBJECTIVE:  Vital Signs: Vitals:   12/17/24 1230 12/17/24 1245 12/17/24 1300 12/17/24 1315  BP: (!) 146/75 136/71 (!) 143/71 138/73  Pulse: 72 71 72 67  Resp: (!) 22 18 17 16   Temp:    98.4 F (36.9 C)  TempSrc:    Oral  SpO2: 96% 96% 97% 96%  Weight:      Height:       Supplemental O2: Room Air SpO2: 96 % O2 Flow Rate (L/min): 2 L/min  Filed Weights   12/17/24 0816  Weight: 99.8 kg    Intake/Output Summary (Last 24 hours) at 12/17/2024 1605 Last data filed at 12/16/2024 2302 Gross per 24 hour  Intake 125.23 ml  Output --  Net 125.23 ml   Net IO Since Admission: 125.23 mL [12/17/24 1605]  Physical Exam: Physical Exam Constitutional:      General: He is not in acute distress.    Appearance: He is obese. He is not ill-appearing, toxic-appearing or diaphoretic.  Cardiovascular:     Rate and Rhythm: Regular rhythm. Bradycardia present.  Pulmonary:     Effort: Pulmonary effort is normal.     Breath sounds: Normal breath sounds.  Abdominal:     General: Bowel sounds are normal.     Palpations: Abdomen is soft.     Tenderness: There is no abdominal tenderness.  Musculoskeletal:     Right lower leg: No edema.     Left lower leg: No edema.  Skin:    General: Skin is warm.  Neurological:      General: No focal deficit present.     Mental Status: He is alert.    Patient Lines/Drains/Airways Status     Active Line/Drains/Airways     Name Placement date Placement time Site Days   Peripheral IV 12/16/24 20 G Left Antecubital 12/16/24  0920  Antecubital  1   Peripheral IV 12/16/24 22 G Right Forearm 12/16/24  1649  Forearm  1            Pertinent labs and imaging:     Latest Ref Rng & Units 12/17/2024    2:27 PM 12/17/2024    5:15 AM 12/16/2024    9:42 AM  CBC  WBC 4.0 - 10.5 K/uL 5.4  6.6  5.1   Hemoglobin 13.0 - 17.0 g/dL 85.3  85.2  84.4   Hematocrit 39.0 - 52.0 % 41.2  41.4  43.6   Platelets 150 - 400 K/uL 225  230  229        Latest Ref Rng & Units 12/17/2024    2:27 PM 12/17/2024    5:15 AM 12/16/2024    9:42 AM  CMP  Glucose 70 - 99 mg/dL  893  891   BUN 8 - 23 mg/dL  17  13   Creatinine 9.38 - 1.24 mg/dL 8.65  8.56  8.51  Sodium 135 - 145 mmol/L  139  137   Potassium 3.5 - 5.1 mmol/L  4.4  4.3   Chloride 98 - 111 mmol/L  104  102   CO2 22 - 32 mmol/L  23  21   Calcium  8.9 - 10.3 mg/dL  9.4  89.9     CARDIAC CATHETERIZATION Result Date: 12/17/2024   Mid LAD lesion is 40% stenosed.   Dist LAD lesion is 60% stenosed. 1.  Moderate one-vessel coronary artery disease.  Worst stenosis is 60% in the distal LAD and the whole vessel is small in diameter and diffusely diseased.  The right coronary artery is superdominant. 2.  Left ventricular angiography was not performed due to chronic kidney disease.  Mildly elevated left ventricular end-diastolic pressure at 15 mmHg. Recommendations: No clear culprit is identified for unstable angina.  Recommend continuing medical therapy.  I added low-dose aspirin  to clopidogrel . Obtain an echocardiogram to evaluate ejection fraction and wall motion. Gentle hydration post cath.  45 mL of contrast was used.    ASSESSMENT/PLAN:  Assessment: Principal Problem:   Unstable angina (HCC) Active Problems:   Non-ST elevation (NSTEMI)  myocardial infarction [facility])  Lee Stevens is a 64 y.o. male living with a history of prediabetes, hyperlipidemia intolerant to statins, hypothyroidism, hypertension, C5-C7 ACDF 07/2024, nonobstructive CAD, PFO, LAFB, OSA, GERD, CKD stage III, prostate and bladder cancer who presented with chest pain and admitted for unstable angina on hospital day 1.  Plan: #NSTEMI #nonobstructive CAD Patient presenting with unprovoked substernal left sided chest pain unrelieved and reoccurring at rest with steadily elevating troponins ->undetectable, 28, 46, 78, Cr 1.4 which appears to be around baseline, EKG with unchanged LAFB, unremarkable CXR and VSS. Started on heparin  gtt and pending echo. Cards consulted and following, LCH this morning resulted showing moderate diffuse CAD of LAD (mid LAD 40% stenosed and distal LAD 60% stenosed), RCA superdominant, LVEP mildly elevated at 15 mmHg -> cards recommending DAPT for 1 year. - discontinued heparin  gtt - Pending echo - daily BMP, CBC, Mag - Continue home Plavix  25 mg daily - start ASA 81mg  daily for 1 year - Continue home metoprolol  25 mg daily - Continue home nitroglycerin  as needed - continue topical NGT prn  # HTN Patient notes good compliance with his blood pressure medications, typically in the 120s/80s at home, though notes pulse rate typically in the 50s to 60s, denies lightheadedness, dizziness, vision changes. VSS, BP elevated to 140s/80s. - Continue home metoprolol  25 mg daily - start irbesartan  150mg  daily    # Hyperlipidemia Lipid panel from 1 month ago WNL, LDL 22, cholesterol 94, HDL 43, except for TGs, 218. LPA on 09/03/2022 was 8.4. Stable, no acute concern at this time. - continue outpatient Repatha  injections  #GERD -continue lansoprazole 15 mg daily/pantoprazole  20 mg daily # Hypothyroidism -continue Synthroid  75 mcg daily # hx of prostate CA - hold home Tadalafil 5mg  and 20 mg every other day, given pt receiving NG # s/p ACDF -hold  home Lyrica, robaxin , norco; patient not taking & not complaining of MSK/neuropathic pain # anxiety -hold home Xanax , patient states he has not taken this in the last 6 months  Best Practice: Diet: Cardiac diet IVF: Fluids: none, Rate: None VTE: enoxaparin  (LOVENOX ) injection 40 mg Start: 12/18/24 0800 Code: Full  Disposition planning: Therapy Recs: None, DME: none Family Contact: wife at bedside DISPO: Anticipated discharge tomorrow to Home pending continuous monitoring of unstable angina.  Signature: Alfornia Light, DO [place]  [facility]  4:05 PM, 12/17/2024  On Call pager [phone]  "

## 2024-12-17 NOTE — Progress Notes (Signed)
 Echocardiogram 2D Echocardiogram has been performed.  Lee Stevens 12/17/2024, 5:46 PM

## 2024-12-17 NOTE — Interval H&P Note (Signed)
 History and Physical Interval Note:  12/17/2024 8:54 AM  Lee Stevens  has presented today for surgery, with the diagnosis of unstable angina.  The various methods of treatment have been discussed with the patient and family. After consideration of risks, benefits and other options for treatment, the patient has consented to  Procedures: LEFT HEART CATH AND CORONARY ANGIOGRAPHY (N/A) as a surgical intervention.  The patient's history has been reviewed, patient examined, no change in status, stable for surgery.  I have reviewed the patient's chart and labs.  Questions were answered to the patient's satisfaction.     Billye Nydam

## 2024-12-17 NOTE — Plan of Care (Signed)
" °  Problem: Education: Goal: Knowledge of the procedure and recovery process will improve Outcome: Progressing   Problem: Bowel/Gastric: Goal: Gastrointestinal status for postoperative course will improve Outcome: Progressing   Problem: Pain Management: Goal: General experience of comfort will improve Outcome: Progressing   Problem: Skin Integrity: Goal: Demonstration of wound healing without infection will improve Outcome: Progressing   Problem: Urinary Elimination: Goal: Ability to avoid or minimize complications of infection will improve Outcome: Progressing Goal: Ability to achieve and maintain urine output will improve Outcome: Progressing Goal: Home care management will improve Outcome: Progressing   Problem: Education: Goal: Understanding of CV disease, CV risk reduction, and recovery process will improve Outcome: Progressing Goal: Individualized Educational Video(s) Outcome: Progressing   Problem: Activity: Goal: Ability to return to baseline activity level will improve Outcome: Progressing   Problem: Cardiovascular: Goal: Ability to achieve and maintain adequate cardiovascular perfusion will improve Outcome: Progressing Goal: Vascular access site(s) Level 0-1 will be maintained Outcome: Progressing   Problem: Health Behavior/Discharge Planning: Goal: Ability to safely manage health-related needs after discharge will improve Outcome: Progressing   Problem: Education: Goal: Knowledge of General Education information will improve Description: Including pain rating scale, medication(s)/side effects and non-pharmacologic comfort measures Outcome: Progressing   Problem: Health Behavior/Discharge Planning: Goal: Ability to manage health-related needs will improve Outcome: Progressing   Problem: Clinical Measurements: Goal: Ability to maintain clinical measurements within normal limits will improve Outcome: Progressing Goal: Will remain free from  infection Outcome: Progressing Goal: Diagnostic test results will improve Outcome: Progressing Goal: Respiratory complications will improve Outcome: Progressing Goal: Cardiovascular complication will be avoided Outcome: Progressing   Problem: Activity: Goal: Risk for activity intolerance will decrease Outcome: Progressing   Problem: Nutrition: Goal: Adequate nutrition will be maintained Outcome: Progressing   Problem: Coping: Goal: Level of anxiety will decrease Outcome: Progressing   Problem: Elimination: Goal: Will not experience complications related to bowel motility Outcome: Progressing Goal: Will not experience complications related to urinary retention Outcome: Progressing   Problem: Pain Managment: Goal: General experience of comfort will improve and/or be controlled Outcome: Progressing   Problem: Safety: Goal: Ability to remain free from injury will improve Outcome: Progressing   Problem: Skin Integrity: Goal: Risk for impaired skin integrity will decrease Outcome: Progressing   "

## 2024-12-17 NOTE — Progress Notes (Addendum)
 "  Progress Note  Patient Name: Lee Stevens Date of Encounter: 12/17/2024 [facility] Cardiologist: Redell Leiter, MD   Interval Summary  Patient seen shortly after returning from cath lab for [facility]. Doing well after cath, denies any chest pain currently but states that it was intermittent overnight though mild in severity, did not require any topical nitroglycerin . No other complaints at this time  Discussed results of catheterization and recommendation to continue medical therapy with addition of ASA. Patient with history of tinnitus on ASA and switched to Plavix  about two years ago but continued to have intermittent tinnitus. Later on evaluated and treated for tinnitus/hearing loss, suspected to be unrelated to ASA use.    Vital Signs Vitals:   12/17/24 0800 12/17/24 0816 12/17/24 0850 12/17/24 0945  BP: 125/75   (!) 144/87  Pulse:    (!) 58  Resp: 14   12  Temp:    98.1 F (36.7 C)  TempSrc:    Oral  SpO2: 97%  100% 93%  Weight:  99.8 kg    Height:  6' 3 (1.905 m)      Intake/Output Summary (Last 24 hours) at 12/17/2024 1100 Last data filed at 12/16/2024 2302 Gross per 24 hour  Intake 125.23 ml  Output --  Net 125.23 ml      12/17/2024    8:16 AM 08/12/2024    3:37 PM 02/27/2024    8:55 AM  Last 3 Weights  Weight (lbs) 220 lb 214 lb 211 lb  Weight (kg) 99.791 kg 97.07 kg 95.709 kg      Telemetry/ECG  Telemetry showed sinus rhythm, rate in the 50s - Personally Reviewed 1/8 EKG unchanged from prior  Physical Exam  GEN: Resting comfortably in bed, in no acute distress.   Neck: No JVD Cardiac: Bradycardic, regular rhythm, no murmurs, rubs, or gallops.  Respiratory: Clear to auscultation bilaterally. GI: Soft, nontender MS: TR band in place, no lower extremity edema  Assessment & Plan  NSTEMI Nonobstructive CAD Presented with 4 total episodes of intermittent retrosternal chest pain radiating to the left arm and neck/upper back ED EKG showed sinus rhythm with  LAFB, rate 65 bpm, unchanged from prior EKG Troponins initially undetectably low, then 28 >> 46 >> 78 No active cardiopulmonary disease on CXR Echo pending, no prior echo for review/comparison LHC [08/09/22]: Mid LAD lesion with 40% stenosis, recommended medical therapy  Given pt with longstanding history of intermittent chest pain presenting with new chest pain that is different in severity and character and uptrending tropnonins, concerning for ACS and will plan for cardiac catheterization LHC this morning showed moderate diffuse disease of LAD (mid LAD 40% stenosed and distal LAD 60% stenosed), superdominant RCA, LVEP 15 mmHg Recommendation to continue medical therapy with addition of ASA Due to history of tinnitus on ASA, d/w patient which revealed he had ENT evaluation for 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 Cath findings noted. Agree with above note.   Lonni Verlin COME, FACC 12/17/2024  11:21 AM   "

## 2024-12-17 NOTE — Progress Notes (Signed)
 PHARMACY - ANTICOAGULATION CONSULT NOTE  Pharmacy Consult for continuous infusion of unfractionated heparin , intravenously administered. Indication: chest pain/ACS  Allergies[1]  Patient Measurements:    Vital Signs: Temp: 98.5 F (36.9 C) (01/08 0639) Temp Source: Oral (01/08 0639) BP: 142/75 (01/08 0605) Pulse Rate: 60 (01/08 0605)  Labs: Recent Labs    12/16/24 0942 12/16/24 2310 12/17/24 0515  HGB 15.5  --  14.7  HCT 43.6  --  41.4  PLT 229  --  230  HEPARINUNFRC  --  0.47 0.42  CREATININE 1.48*  --  1.43*    CrCl cannot be calculated (Unknown ideal weight.).   Medical History: Past Medical History:  Diagnosis Date   Allergic rhinitis    Anticoagulant long-term use    plavix ---   managed by cardiology   Anxiety disorder 2018   Arthritis    neck   Atypical chest pain 09/25/2018   followed by cardiology-- dr monetta;  continue's to monitor for long history per dr monetta note pt had cardiac cath in 2008 w/ luminal irregularity;  normal stress echo 04/ 2017;   Benign hypertension 01/19/2016   Bladder tumor 05/2023   BPH associated with nocturia    CAD in native artery 12/01/2018   cardiologist--- dr monetta;   coronary CT FFR/ morph 11-14-2018  calcium  score=514 involving RCA/ LAD/ PDA/ PL;   cardiac cath 08-09-2022  nonob CAD mild LAD 40%   Cancer (HCC)    bladder   Chronic kidney disease, stage 3 (HCC) 04/19/2017   folllowed by pcp   ETD (Eustachian tube dysfunction), right    GERD (gastroesophageal reflux disease)    History of anal fissures 04/10/2016   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 georgina (note in care everywhere )---  06/ 2024 parotid right greater than left ,  resolved without intervention   Prostate nodule 04/25/2023   Right lower quadrant abdominal pain 05/09/2023   Subclinical hypothyroidism    Tinnitus of both ears 11/26/2018   Wears glasses     Medications:  Scheduled:   clopidogrel   75 mg Oral Daily   irbesartan   150 mg Oral Daily   levothyroxine   75 mcg Oral QAC breakfast   metoprolol  succinate  25 mg Oral Daily   nitroGLYCERIN   0.5 inch Topical Q6H   pantoprazole   20 mg Oral Daily    Assessment: HL at 0515 hours today reported as 0.42 units/mL, therapeutic on 1,400 units of UFH per hour.  Goal of Therapy:  Heparin  level 0.3-0.7 units/ml Monitor platelets by anticoagulation protocol: Yes   Plan:  Check anti-Xa heparin  level daily while on CIUFH, continue same infusion rate as currently infusing at 1,400 units of unfractionated heparin  per hour via pump.  Lynwood KATHEE Lites, PharmD, CPP Clinical Pharmacist Practitioner 12/17/2024,7:52 AM      [1]  Allergies Allergen Reactions   Keflex [Cephalexin] Anaphylaxis and Rash   Hydrochlorothiazide     Joint Pain.    Crestor [Rosuvastatin]     Myalgias (intolerance)

## 2024-12-17 NOTE — Progress Notes (Signed)
 PHARMACY - ANTICOAGULATION CONSULT NOTE  Pharmacy Consult for Heparin  Indication: chest pain/ACS  Allergies[1]  Patient Measurements:   Ht 75 in Wt ~97.5 kg  Vital Signs: Temp: 98 F (36.7 C) (01/07 2200) Temp Source: Oral (01/07 1630) BP: 111/62 (01/07 2300) Pulse Rate: 64 (01/07 2300)  Labs: Recent Labs    12/16/24 0942 12/16/24 2310  HGB 15.5  --   HCT 43.6  --   PLT 229  --   HEPARINUNFRC  --  0.47  CREATININE 1.48*  --     CrCl cannot be calculated (Unknown ideal weight.).   Medical History: Past Medical History:  Diagnosis Date   Allergic rhinitis    Anticoagulant long-term use    plavix ---   managed by cardiology   Anxiety disorder 2018   Arthritis    neck   Atypical chest pain 09/25/2018   followed by cardiology-- dr monetta;  continue's to monitor for long history per dr monetta note pt had cardiac cath in 2008 w/ luminal irregularity;  normal stress echo 04/ 2017;   Benign hypertension 01/19/2016   Bladder tumor 05/2023   BPH associated with nocturia    CAD in native artery 12/01/2018   cardiologist--- dr monetta;   coronary CT FFR/ morph 11-14-2018  calcium  score=514 involving RCA/ LAD/ PDA/ PL;   cardiac cath 08-09-2022  nonob CAD mild LAD 40%   Cancer (HCC)    bladder   Chronic kidney disease, stage 3 (HCC) 04/19/2017   folllowed by pcp   ETD (Eustachian tube dysfunction), right    GERD (gastroesophageal reflux disease)    History of anal fissures 04/10/2016   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 georgina (note in care everywhere )---   06/ 2024 parotid right greater than left ,  resolved without intervention   Prostate nodule 04/25/2023   Right lower quadrant abdominal pain 05/09/2023   Subclinical hypothyroidism    Tinnitus of both ears 11/26/2018   Wears glasses      Assessment: 64 y.o. M prsents with CP. To begin heparin  for r/o ACS. No AC PTA. CBC ok on admission.  1/8 AM update:  Initial heparin  level therapeutic   Goal of Therapy:  Heparin  level 0.3-0.7 units/ml Monitor platelets by anticoagulation protocol: Yes   Plan:  Cont heparin  at 1400 units/hr Heparin  level with AM labs   Lynwood Mckusick, PharmD, BCPS Clinical Pharmacist Phone: [phone]        [1]  Allergies Allergen Reactions   Keflex [Cephalexin] Anaphylaxis and Rash   Hydrochlorothiazide     Joint Pain.    Crestor [Rosuvastatin]     Myalgias (intolerance)

## 2024-12-18 ENCOUNTER — Other Ambulatory Visit (HOSPITAL_COMMUNITY): Payer: Self-pay

## 2024-12-18 ENCOUNTER — Encounter (HOSPITAL_COMMUNITY): Payer: Self-pay | Admitting: Cardiovascular Disease

## 2024-12-18 DIAGNOSIS — I129 Hypertensive chronic kidney disease with stage 1 through stage 4 chronic kidney disease, or unspecified chronic kidney disease: Secondary | ICD-10-CM

## 2024-12-18 DIAGNOSIS — E785 Hyperlipidemia, unspecified: Secondary | ICD-10-CM | POA: Diagnosis not present

## 2024-12-18 DIAGNOSIS — E039 Hypothyroidism, unspecified: Secondary | ICD-10-CM | POA: Diagnosis not present

## 2024-12-18 DIAGNOSIS — F419 Anxiety disorder, unspecified: Secondary | ICD-10-CM | POA: Diagnosis not present

## 2024-12-18 DIAGNOSIS — Z8546 Personal history of malignant neoplasm of prostate: Secondary | ICD-10-CM | POA: Diagnosis not present

## 2024-12-18 DIAGNOSIS — K219 Gastro-esophageal reflux disease without esophagitis: Secondary | ICD-10-CM | POA: Diagnosis not present

## 2024-12-18 DIAGNOSIS — Z8679 Personal history of other diseases of the circulatory system: Secondary | ICD-10-CM

## 2024-12-18 DIAGNOSIS — I200 Unstable angina: Secondary | ICD-10-CM

## 2024-12-18 DIAGNOSIS — Z981 Arthrodesis status: Secondary | ICD-10-CM

## 2024-12-18 DIAGNOSIS — N183 Chronic kidney disease, stage 3 unspecified: Secondary | ICD-10-CM | POA: Diagnosis not present

## 2024-12-18 LAB — BASIC METABOLIC PANEL WITH GFR
Anion gap: 14 (ref 5–15)
BUN: 14 mg/dL (ref 8–23)
CO2: 22 mmol/L (ref 22–32)
Calcium: 9.4 mg/dL (ref 8.9–10.3)
Chloride: 104 mmol/L (ref 98–111)
Creatinine, Ser: 1.47 mg/dL — ABNORMAL HIGH (ref 0.61–1.24)
GFR, Estimated: 54 mL/min — ABNORMAL LOW
Glucose, Bld: 109 mg/dL — ABNORMAL HIGH (ref 70–99)
Potassium: 4 mmol/L (ref 3.5–5.1)
Sodium: 140 mmol/L (ref 135–145)

## 2024-12-18 LAB — CBC
HCT: 41 % (ref 39.0–52.0)
Hemoglobin: 14.8 g/dL (ref 13.0–17.0)
MCH: 30.8 pg (ref 26.0–34.0)
MCHC: 36.1 g/dL — ABNORMAL HIGH (ref 30.0–36.0)
MCV: 85.2 fL (ref 80.0–100.0)
Platelets: 225 K/uL (ref 150–400)
RBC: 4.81 MIL/uL (ref 4.22–5.81)
RDW: 12.2 % (ref 11.5–15.5)
WBC: 6.2 K/uL (ref 4.0–10.5)
nRBC: 0 % (ref 0.0–0.2)

## 2024-12-18 LAB — MAGNESIUM: Magnesium: 1.7 mg/dL (ref 1.7–2.4)

## 2024-12-18 MED ORDER — MAGNESIUM SULFATE 2 GM/50ML IV SOLN
2.0000 g | Freq: Once | INTRAVENOUS | Status: AC
  Administered 2024-12-18: 2 g via INTRAVENOUS
  Filled 2024-12-18: qty 50

## 2024-12-18 MED ORDER — CLOPIDOGREL BISULFATE 75 MG PO TABS
75.0000 mg | ORAL_TABLET | Freq: Every day | ORAL | 0 refills | Status: AC
  Filled 2024-12-18: qty 30, 30d supply, fill #0

## 2024-12-18 MED ORDER — NITROGLYCERIN 0.4 MG SL SUBL
0.4000 mg | SUBLINGUAL_TABLET | SUBLINGUAL | 0 refills | Status: AC | PRN
  Filled 2024-12-18: qty 25, 15d supply, fill #0

## 2024-12-18 MED ORDER — ASPIRIN 81 MG PO CHEW
81.0000 mg | CHEWABLE_TABLET | Freq: Every day | ORAL | 0 refills | Status: AC
  Filled 2024-12-18: qty 30, 30d supply, fill #0

## 2024-12-18 NOTE — TOC CM/SW Note (Signed)
 Transition of Care [facility]) - Inpatient Brief Assessment   Patient Details  Name: Lee Stevens MRN: [id number] Date of Birth: 08-Jun-1961  [facility] CM/SW Contact:    Sudie Erminio Deems, RN Phone Number: 12/18/2024, 11:13 AM   Clinical Narrative: Patient presented for chest pain-post LHC. PTA patient was independent from home with spouse. Patient has PCP and has no issues with transportation to appointments. No home need identified at this time. Spouse at the bedside and will transport home via private vehicle.    Transition of Care Asessment: Insurance and Status: Insurance coverage has been reviewed Patient has primary care physician: Yes Home environment has been reviewed: reviewed Prior level of function:: independent Prior/Current Home Services: No current home services Social Drivers of Health Review: SDOH reviewed no interventions necessary Readmission risk has been reviewed: Yes Transition of care needs: no transition of care needs at this time

## 2024-12-18 NOTE — Progress Notes (Signed)
 Patient SBP ran in the 140-150s.. Patient has not taken any BP medication but isa concerned about the new BP medication that due to be given at 1000 this morning

## 2024-12-18 NOTE — Progress Notes (Addendum)
"  °  Progress Note  Patient Name: Lee Stevens Date of Encounter: 12/18/2024 [facility] Cardiologist: Redell Leiter, MD   Interval Summary   Doing well, no significant events overnight. States that he had a few brief episodes of chest pain lasting 30-60 seconds at a time last night. No chest pain otherwise. Denies any shortness of breath, palpitations, lower extremity swelling. No other complaints at this time. Wife at bedside. Patient eager to go home  Vital Signs Vitals:   12/17/24 2019 12/18/24 0003 12/18/24 0402 12/18/24 0732  BP:  (!) 145/79  (!) 156/88  Pulse:    64  Resp:   18 15  Temp: 97.9 F (36.6 C) 98.5 F (36.9 C) 98.1 F (36.7 C) 97.9 F (36.6 C)  TempSrc: Oral Oral Oral Oral  SpO2:      Weight:      Height:       No intake or output data in the 24 hours ending 12/18/24 0936    12/17/2024    8:16 AM 08/12/2024    3:37 PM 02/27/2024    8:55 AM  Last 3 Weights  Weight (lbs) 220 lb 214 lb 211 lb  Weight (kg) 99.791 kg 97.07 kg 95.709 kg      Telemetry/ECG  Telemetry shows sinus rhythm, rate in the 70s - Personally Reviewed  Physical Exam  GEN: Sitting upright in chair eating breakfast, in no acute distress.   Neck: No JVD Cardiac: RRR, no murmurs, rubs, or gallops.  Respiratory: Clear to auscultation bilaterally. GI: Soft, nontender MS: No peripheral edema  Assessment & Plan   NSTEMI Nonobstructive CAD Presented with 4 total episodes of intermittent retrosternal chest pain radiating to the left arm and neck/upper back ED EKG showed sinus rhythm with LAFB, rate 65 bpm, unchanged from prior EKG Troponins initially undetectably low, then 28 >> 46 >> 78 No active cardiopulmonary disease on CXR LHC [12/17/24] showed moderate diffuse disease of LAD (mid LAD 40% stenosed and distal LAD 60% stenosed), superdominant RCA, LVEP 15 mmHg Due to history of tinnitus on ASA, d/w patient which revealed he had ENT evaluation for tinnitus/hearing loss thought to be  autoimmune and unlikely to be related to ASA use Continue metoprolol  succinate 25 mg daily, Plavix  75 mg daily, ASA Echo [12/17/24]: LVEF 55-60% no RWMA, normal RV systolic function, no significant valvular abnormalities, normal IVC   Hypertension BP in the 140s-150s/80s, continue home antihypertensives   Hyperlipidemia Known intolerance to statins Home meds: Repatha    Per primary CKD stage III GERD OSA   For questions or updates, please contact [facility] Please consult [email or web address] for contact info under         Signed, Owen MARLA Daniels, PA-C   I have personally seen and examined this patient. I agree with the assessment and plan as outlined above.  64 yo male admitted with chest pain. Subtle elevation troponin Cardiac cath with moderate non-obstructive disease No pain this am.  Labs reviewed by me Vitals reviewed by me Tele: sinus My exam: NAD RRR Lungs clear Ext: no edema  CAD with stable angina: OK to d/c home today on ASA, Plavix , Toprol . He is statin intolerant. He is on Repatha . Consider Ranexa  or Imdur as outpatient if his angina worsens.   Lonni Cash, MD, [facility] 12/18/2024 10:23 AM   "

## 2024-12-18 NOTE — Progress Notes (Signed)
" °  Progress Note   Date: 12/18/2024  Patient Name: Lee Stevens        MRN#: [id number]  Clarification of diagnosis:  NSTEMI      "

## 2024-12-18 NOTE — Progress Notes (Signed)
 CARDIAC REHAB PHASE I    Post NSTEMI education provided to patient and family at bedside. Patient eating breakfast at the time of education and will ambulate at a later time. Education included restrictions, risk factors, exercise guidelines, antiplatelet therapy importance, MI booklet, NTG use, heart healthy diet, and CRP2 reviewed. All questions and concerns addressed. Will refer to  [facility] for CRP2.    [id number] Isaiah JAYSON Liverpool, RN BSN 12/18/2024 9:05 AM

## 2024-12-18 NOTE — Discharge Summary (Signed)
 "  Name: Lee Stevens MRN: [id number] DOB: Aug 14, 1961 64 y.o. PCP: Ofilia Lamar CROME, MD  Date of Admission: 12/16/2024  9:17 AM Date of Discharge: 12/18/24 Attending Physician: Dr. Jone Dauphin  Discharge Diagnosis: 1. Principal Problem:   Unstable angina [facility]) Active Problems:   Non-ST elevation (NSTEMI) myocardial infarction [facility])  Discharge Medications: Allergies as of 12/18/2024       Reactions   Keflex [cephalexin] Anaphylaxis, Rash   Hydrochlorothiazide    Joint Pain.    Crestor [rosuvastatin]    Myalgias (intolerance)        Medication List     TAKE these medications    acetaminophen  500 MG tablet Commonly known as: TYLENOL  Take 1,000 mg by mouth every 6 (six) hours as needed (pain.).   ALPRAZolam  0.25 MG tablet Commonly known as: XANAX  Take 0.25 mg by mouth daily as needed for anxiety.   aspirin  81 MG chewable tablet Chew 1 tablet (81 mg total) by mouth daily. Start taking on: December 19, 2024   clopidogrel  75 MG tablet Commonly known as: PLAVIX  Take 1 tablet (75 mg total) by mouth daily.   lansoprazole 15 MG capsule Commonly known as: PREVACID Take 15 mg by mouth at bedtime.   levothyroxine  75 MCG tablet Commonly known as: SYNTHROID  Take 75 mcg by mouth daily before breakfast.   metoprolol  succinate 25 MG 24 hr tablet Commonly known as: TOPROL -XL Take 1 tablet (25 mg total) by mouth daily.   nitroGLYCERIN  0.4 MG SL tablet Commonly known as: NITROSTAT  Place 1 tablet (0.4 mg total) under the tongue every 5 (five) minutes as needed for chest pain. What changed: when to take this   olmesartan  20 MG tablet Commonly known as: Benicar  Take 1 tablet (20 mg total) by mouth daily.   Repatha  SureClick 140 MG/ML Soaj Generic drug: Evolocumab  Inject 140 mg into the skin every 14 (fourteen) days.   tadalafil 5 MG tablet Commonly known as: CIALIS Take 5 mg by mouth every other day.   tadalafil 20 MG tablet Commonly known as: CIALIS Take 20 mg by mouth  every other day.       Disposition and follow-up:   Mr.Lee Stevens was discharged from [facility] in Good condition.  At the hospital follow up visit please address:  1.  Continue optimization of anti-HTN meds, consider Ranexa  or Imdur as outpatient if angina worsens  2.  Labs / imaging needed at time of follow-up: n/a  3.  Pending labs/ test needing follow-up: n/a  Thank you for allowing us  to be part of your care. You were hospitalized for NSTEMI. We treated you with anticoagulation and LHC, started DAPT for 1 year  See the changes in your medications and management of your chronic conditions below:  *For your NSTEMI, nonobstructive CAD -We have STARTED you on these following medications:  - ASA 81mg  daily  FOLLOW UP APPOINTMENTS: Please visit Dr. Ofilia, Lamar CROME, MD (family medicine doctor) on 12/24/24 at 2pm  Please see Dr. Monetta, Redell PARAS, MD (your cardiologist) on 01/11/25 at 1:20pm  Please call your PCP or our clinic if you have any questions or concerns, we may be able to help and keep you from a long and expensive emergency room wait. Our clinic and after hours phone number is [phone]. The best time to call is Monday through Friday 9 am to 4 pm but there is always someone available 24/7 if you have an emergency. If you need medication refills please notify  your pharmacy one week in advance and they will send us  a request.   We are glad you are feeling better,  Alfornia Light, DO Internal Medicine Inpatient Teaching Service at [facility]   Follow-up Appointments:  Follow-up Information     Ofilia Lamar CROME, MD. Go on 12/24/2024.   Specialty: Family Medicine Why: 2pm Contact information: [address] [place] [address] [phone]         Monetta Redell PARAS, MD. Go on 01/11/2025.   Specialties: Cardiology, Radiology Why: at 1:20pm Contact information: [address] [place] [address] [phone]                Hospital  Course by problem list: Lee Stevens is a 64 y.o. person living with a history of prediabetes, hyperlipidemia intolerant to statins, hypothyroidism, hypertension, C5-C7 ACDF 07/2024, nonobstructive CAD, PFO, LAFB, OSA, GERD, CKD stage III, prostate and bladder cancer who presented with chest pain and admitted for NSTEMI, now being discharged on hospital day 2 with the following pertinent hospital course:  # NSTEMI # History of nonobstructive CAD Patient presented with unprovoked substernal left sided chest pain, unrelieved by aspirin , and only minimally relieved with nitroglycerin .  Chest pain comes and goes at rest, continuing to persist in the ED, recurring 3 times since arrival.  Troponins have been increasing, initially <15, 28, 46 then 78. TIMI score 4 points, demonstrating increased risk of adverse outcome and HEART score of 7 points, indicating 50 to 65% risk of adverse cardiac event.  Patient afebrile with EKG unchanged from previous showing left anterior fascicular block, not acute ST changes. Placed patient on heparin  drip given unstable chest pain and consulted cardiology. Cards recommended PCI, and patient made NPO in anticipation of procedure. LHC showed moderate diffuse CAD of LAD (mid LAD 40% stenosed and distal LAD 60% stenosed), RCA superdominant, LVEP mildly elevated at 15 mmHg. Echo resulted without regional wall motion abnormalities and EF of 55-60%, cards recommending DAPT for 1 year, added daily 81mg  ASA in addition to Plavix . Patient also given prescription for nitroglycerine for CP as needed, and instructed to return to the ED if CP unrelieved with this.   # Hyperlipidemia Lipid panel from 1 month ago WNL, LDL 22, cholesterol 94, HDL 43, except for TGs, 218. LPA on 09/03/2022 was 8.4. Stable, no acute concern at this time, continue outpatient Repatha  injections.   # HTN Patient notes good compliance with his blood pressure medications, typically in the 120s/80s at home, though notes  pulse rate typically in the 50s to 60s, denied lightheadedness, dizziness, vision changes. Bps soft on assessment in ED, so held Benicar  and waiting fro reassessment. Bps elevated on assessment, so pharmacy formulary alternative to Benicar  was started with improvement in BP, and patient resumed on home medications on discharge with the addition of daily 81mg  ASA.   Stable chronic medical conditions: GERD Hypothyroidism Hx prostate & bladder CA S/p ACDF anxiety  Subjective Patient reports to be doing well, wife at bedside, only noting one episode of CP that occurred overnight and resolved on its own without intervention in about 30 seconds. Denies any other episodes of CP, SOB, vision changes or back pain. Discussed with patient results of his LHC yesterday and explained DAPT. Patient voiced understanding, no concerns at this time other than getting home.   Discharge Exam:   BP (!) 156/88 (BP Location: Left Arm)   Pulse 64   Temp 97.9 F (36.6 C) (Oral)   Resp 15  Ht 6' 3 (1.905 m)   Wt 99.8 kg   SpO2 96%   BMI 27.50 kg/m  Discharge exam:  Constitutional:      General: He is not in acute distress.    Appearance: He is obese. He is not ill-appearing, toxic-appearing or diaphoretic.  Cardiovascular:     Rate and Rhythm: Regular rhythm. Bradycardia present.  Pulmonary:     Effort: Pulmonary effort is normal.     Breath sounds: Normal breath sounds.  Abdominal:     General: Bowel sounds are normal.     Palpations: Abdomen is soft.     Tenderness: There is no abdominal tenderness.  Musculoskeletal:     Right lower leg: No edema.     Left lower leg: No edema.  Skin:    General: Skin is warm.  Neurological:     General: No focal deficit present.     Mental Status: He is alert.  Pertinent Labs, Studies, and Procedures:     Latest Ref Rng & Units 12/18/2024    4:51 AM 12/17/2024    2:27 PM 12/17/2024    5:15 AM  CBC  WBC 4.0 - 10.5 K/uL 6.2  5.4  6.6   Hemoglobin 13.0 - 17.0  g/dL 85.1  85.3  85.2   Hematocrit 39.0 - 52.0 % 41.0  41.2  41.4   Platelets 150 - 400 K/uL 225  225  230        Latest Ref Rng & Units 12/18/2024    4:51 AM 12/17/2024    2:27 PM 12/17/2024    5:15 AM  CMP  Glucose 70 - 99 mg/dL 890   893   BUN 8 - 23 mg/dL 14   17   Creatinine 9.38 - 1.24 mg/dL 8.52  8.65  8.56   Sodium 135 - 145 mmol/L 140   139   Potassium 3.5 - 5.1 mmol/L 4.0   4.4   Chloride 98 - 111 mmol/L 104   104   CO2 22 - 32 mmol/L 22   23   Calcium  8.9 - 10.3 mg/dL 9.4   9.4     CARDIAC CATHETERIZATION Result Date: 12/17/2024   Mid LAD lesion is 40% stenosed.   Dist LAD lesion is 60% stenosed. 1.  Moderate one-vessel coronary artery disease.  Worst stenosis is 60% in the distal LAD and the whole vessel is small in diameter and diffusely diseased.  The right coronary artery is superdominant. 2.  Left ventricular angiography was not performed due to chronic kidney disease.  Mildly elevated left ventricular end-diastolic pressure at 15 mmHg. Recommendations: No clear culprit is identified for unstable angina.  Recommend continuing medical therapy.  I added low-dose aspirin  to clopidogrel . Obtain an echocardiogram to evaluate ejection fraction and wall motion. Gentle hydration post cath.  45 mL of contrast was used.   ECHOCARDIOGRAM COMPLETE Result Date: 12/17/2024    ECHOCARDIOGRAM REPORT   Patient Name:   Lee Stevens Date of Exam: 12/17/2024 Medical Rec #:  [id number]     Height:       75.0 in Accession #:    [id number]    Weight:       220.0 lb Date of Birth:  Apr 29, 1961     BSA:          2.285 m Patient Age:    63 years      BP:           139/76 mmHg Patient Gender: M  HR:           60 bpm. Exam Location:  Inpatient Procedure: 2D Echo, Cardiac Doppler, Color Doppler and Intracardiac            Opacification Agent (Both Spectral and Color Flow Doppler were            utilized during procedure). Indications:    Chest Pain  History:        Patient has no prior history of  Echocardiogram examinations.                 Acute MI, Angina and CAD, Signs/Symptoms:Chest Pain; Risk                 Factors:Dyslipidemia, Sleep Apnea, Hypertension and Former                 Smoker.  Sonographer:    Juliene Rucks Referring Phys: [id number] PHOEBE CHUN  Sonographer Comments: Technically difficult study due to poor echo windows. Image acquisition challenging due to respiratory motion. IMPRESSIONS  1. Left ventricular ejection fraction, by estimation, is 55 to 60%. The left ventricle has normal function. The left ventricle has no regional wall motion abnormalities. Left ventricular diastolic parameters were normal.  2. Right ventricular systolic function is normal. The right ventricular size is normal.  3. The mitral valve is grossly normal. No evidence of mitral valve regurgitation. No evidence of mitral stenosis.  4. The aortic valve was not well visualized. Aortic valve regurgitation is not visualized. No aortic stenosis is present.  5. The inferior vena cava is normal in size with <50% respiratory variability, suggesting right atrial pressure of 8 mmHg. Comparison(s): No prior Echocardiogram. Conclusion(s)/Recommendation(s): Normal biventricular function without evidence of hemodynamically significant valvular heart disease. Technically challenging images. Echo contrast shows normal LVEF without focal WMA. Valves not well visualized but no severe stenosis or regurgitation by doppler evaluation. FINDINGS  Left Ventricle: Left ventricular ejection fraction, by estimation, is 55 to 60%. The left ventricle has normal function. The left ventricle has no regional wall motion abnormalities. Definity  contrast agent was given IV to delineate the left ventricular  endocardial borders. The left ventricular internal cavity size was normal in size. There is no left ventricular hypertrophy. Left ventricular diastolic parameters were normal. Right Ventricle: The right ventricular size is normal. Right vetricular  wall thickness was not well visualized. Right ventricular systolic function is normal. Left Atrium: Left atrial size was normal in size. Right Atrium: Right atrial size was normal in size. Pericardium: There is no evidence of pericardial effusion. Mitral Valve: The mitral valve is grossly normal. No evidence of mitral valve regurgitation. No evidence of mitral valve stenosis. Tricuspid Valve: The tricuspid valve is not well visualized. Tricuspid valve regurgitation is not demonstrated. No evidence of tricuspid stenosis. Aortic Valve: The aortic valve was not well visualized. Aortic valve regurgitation is not visualized. No aortic stenosis is present. Pulmonic Valve: The pulmonic valve was not well visualized. Pulmonic valve regurgitation is not visualized. No evidence of pulmonic stenosis. Aorta: The aortic root, ascending aorta and aortic arch are all structurally normal, with no evidence of dilitation or obstruction. Venous: The inferior vena cava is normal in size with less than 50% respiratory variability, suggesting right atrial pressure of 8 mmHg. IAS/Shunts: The interatrial septum was not well visualized.  LEFT VENTRICLE PLAX 2D LVIDd:         5.20 cm   Diastology LVIDs:         3.70 cm  LV e' medial:    8.27 cm/s LV PW:         1.10 cm   LV E/e' medial:  10.0 LV IVS:        1.20 cm   LV e' lateral:   8.36 cm/s LVOT diam:     2.00 cm   LV E/e' lateral: 9.9 LV SV:         61 LV SV Index:   27 LVOT Area:     3.14 cm  RIGHT VENTRICLE RV Basal diam:  2.70 cm RV Mid diam:    2.30 cm RV S prime:     15.10 cm/s TAPSE (M-mode): 1.9 cm LEFT ATRIUM           Index        RIGHT ATRIUM           Index LA diam:      3.20 cm 1.40 cm/m   RA Area:     11.30 cm LA Vol (A4C): 38.7 ml 16.94 ml/m  RA Volume:   23.80 ml  10.42 ml/m  AORTIC VALVE LVOT Vmax:   102.00 cm/s LVOT Vmean:  63.400 cm/s LVOT VTI:    0.193 m  AORTA Ao Root diam: 3.40 cm Ao Asc diam:  2.80 cm MITRAL VALVE MV Area (PHT): 2.99 cm    SHUNTS MV Decel  Time: 254 msec    Systemic VTI:  0.19 m MV E velocity: 82.80 cm/s  Systemic Diam: 2.00 cm MV A velocity: 83.30 cm/s MV E/A ratio:  0.99 Shelda Bruckner MD Electronically signed by Shelda Bruckner MD Signature Date/Time: 12/17/2024/9:39:11 PM    Final    DG Chest 2 View Result Date: 12/16/2024 CLINICAL DATA:  Chest pain. EXAM: CHEST - 2 VIEW COMPARISON:  01/06/2023 FINDINGS: Heart size is stable and within normal limits. Trachea is midline. Surgical plate in the lower cervical spine. No large pleural effusions. No acute bone abnormality. IMPRESSION: No active cardiopulmonary disease. Electronically Signed   By: Juliene Balder M.D.   On: 12/16/2024 10:10    Discharge Instructions: Discharge Instructions     Amb Referral to Cardiac Rehabilitation   Complete by: As directed    [facility]   Diagnosis: NSTEMI   After initial evaluation and assessments completed: Virtual Based Care may be provided alone or in conjunction with Phase 2 Cardiac Rehab based on patient barriers.: Yes   Intensive Cardiac Rehabilitation (ICR) [facility] location only OR Traditional Cardiac Rehabilitation (TCR) *If criteria for ICR are not met will enroll in TCR [facility] only): Yes      Signed: Idelle Nakai, DO 12/18/2024, 10:35 AM   "

## 2024-12-18 NOTE — Discharge Instructions (Addendum)
 Thank you for allowing us  to be part of your care. You were hospitalized for NSTEMI. We treated you with anticoagulation and LHC, started DAPT for 1 year  See the changes in your medications and management of your chronic conditions below:  *For your NSTEMI, nonobstructive CAD -We have STARTED you on these following medications:  - ASA 81mg  daily  FOLLOW UP APPOINTMENTS: Please visit Dr. Ofilia, Lamar CROME, MD (family medicine doctor) on 12/24/24 at 2pm  Please see Dr. Monetta, Redell PARAS, MD (your cardiologist) on 01/11/25 at 1:20pm  Please call your PCP or our clinic if you have any questions or concerns, we may be able to help and keep you from a long and expensive emergency room wait. Our clinic and after hours phone number is [phone]. The best time to call is Monday through Friday 9 am to 4 pm but there is always someone available 24/7 if you have an emergency. If you need medication refills please notify your pharmacy one week in advance and they will send us  a request.   We are glad you are feeling better,  Alfornia Light, DO Internal Medicine Inpatient Teaching Service at [facility]

## 2024-12-18 NOTE — Progress Notes (Signed)
 CARDIAC REHAB PHASE I   PRE:  Rate/Rhythm: 67 SR    BP: sitting 126/74    SpO2:   MODE:  Ambulation: 430 ft   POST:  Rate/Rhythm: 74 SR    BP: sitting 118/74     SpO2:    Pt tolerated well, no c/o. [id number]  Aliene Aris BS, ACSM-CEP 12/18/2024 9:57 AM

## 2025-01-07 DIAGNOSIS — Z8719 Personal history of other diseases of the digestive system: Secondary | ICD-10-CM | POA: Insufficient documentation

## 2025-01-07 DIAGNOSIS — N401 Enlarged prostate with lower urinary tract symptoms: Secondary | ICD-10-CM | POA: Insufficient documentation

## 2025-01-07 DIAGNOSIS — H9190 Unspecified hearing loss, unspecified ear: Secondary | ICD-10-CM | POA: Insufficient documentation

## 2025-01-07 DIAGNOSIS — M1990 Unspecified osteoarthritis, unspecified site: Secondary | ICD-10-CM | POA: Insufficient documentation

## 2025-01-07 DIAGNOSIS — Z87442 Personal history of urinary calculi: Secondary | ICD-10-CM | POA: Insufficient documentation

## 2025-01-07 DIAGNOSIS — Z973 Presence of spectacles and contact lenses: Secondary | ICD-10-CM | POA: Insufficient documentation

## 2025-01-07 DIAGNOSIS — Z7901 Long term (current) use of anticoagulants: Secondary | ICD-10-CM | POA: Insufficient documentation

## 2025-01-07 DIAGNOSIS — C801 Malignant (primary) neoplasm, unspecified: Secondary | ICD-10-CM | POA: Insufficient documentation

## 2025-01-07 DIAGNOSIS — Z8739 Personal history of other diseases of the musculoskeletal system and connective tissue: Secondary | ICD-10-CM | POA: Insufficient documentation

## 2025-01-11 ENCOUNTER — Ambulatory Visit: Admitting: Cardiology

## 2025-01-11 VITALS — BP 124/72 | HR 54 | Ht 75.0 in | Wt 224.0 lb

## 2025-01-11 DIAGNOSIS — I2510 Atherosclerotic heart disease of native coronary artery without angina pectoris: Secondary | ICD-10-CM

## 2025-01-11 DIAGNOSIS — E782 Mixed hyperlipidemia: Secondary | ICD-10-CM

## 2025-01-11 DIAGNOSIS — I10 Essential (primary) hypertension: Secondary | ICD-10-CM

## 2025-01-11 MED ORDER — EZETIMIBE 10 MG PO TABS: 10.0000 mg | ORAL_TABLET | Freq: Every day | ORAL | 3 refills | Status: AC

## 2025-01-11 NOTE — Patient Instructions (Signed)
 Medication Instructions:  Your physician has recommended you make the following change in your medication:   START: Zetia  10 mg daily  *If you need a refill on your cardiac medications before your next appointment, please call your pharmacy*  Lab Work: Your physician recommends that you return for lab work in:   Labs in 2 months: Lipids, Apo B, Lpa  If you have labs (blood work) drawn today and your tests are completely normal, you will receive your results only by: MyChart Message (if you have MyChart) OR A paper copy in the mail If you have any lab test that is abnormal or we need to change your treatment, we will call you to review the results.  Testing/Procedures: None  Follow-Up: At [facility], you and your health needs are our priority.  As part of our continuing mission to provide you with exceptional heart care, our providers are all part of one team.  This team includes your primary Cardiologist (physician) and Advanced Practice Providers or APPs (Physician Assistants and Nurse Practitioners) who all work together to provide you with the care you need, when you need it.  Your next appointment:   3 month(s)  Provider:   Redell Leiter, MD    We recommend signing up for the patient portal called MyChart.  Sign up information is provided on this After Visit Summary.  MyChart is used to connect with patients for Virtual Visits (Telemedicine).  Patients are able to view lab/test results, encounter notes, upcoming appointments, etc.  Non-urgent messages can be sent to your provider as well.   To learn more about what you can do with MyChart, go to [email or web address].   Other Instructions None

## 2025-04-12 ENCOUNTER — Ambulatory Visit: Admitting: Cardiology
